# Patient Record
Sex: Female | Born: 1955 | Race: Black or African American | Hispanic: No | Marital: Single | Smoking: Current every day smoker
Health system: Southern US, Community
[De-identification: ages and names within clinical notes are randomized; demographics above are authoritative.]

## PROBLEM LIST (undated history)

## (undated) DIAGNOSIS — I509 Heart failure, unspecified: Secondary | ICD-10-CM

## (undated) DIAGNOSIS — I10 Essential (primary) hypertension: Secondary | ICD-10-CM

## (undated) DIAGNOSIS — Z5189 Encounter for other specified aftercare: Secondary | ICD-10-CM

## (undated) DIAGNOSIS — I639 Cerebral infarction, unspecified: Secondary | ICD-10-CM

## (undated) HISTORY — PX: ABDOMINAL SURGERY: SHX537

---

## 1999-05-16 ENCOUNTER — Emergency Department (HOSPITAL_COMMUNITY): Admission: EM | Admit: 1999-05-16 | Discharge: 1999-05-16 | Payer: Self-pay | Admitting: Emergency Medicine

## 2000-12-17 ENCOUNTER — Inpatient Hospital Stay (HOSPITAL_COMMUNITY): Admission: EM | Admit: 2000-12-17 | Discharge: 2000-12-31 | Payer: Self-pay | Admitting: Emergency Medicine

## 2000-12-17 ENCOUNTER — Encounter: Payer: Self-pay | Admitting: Emergency Medicine

## 2000-12-17 ENCOUNTER — Encounter: Payer: Self-pay | Admitting: Anesthesiology

## 2000-12-19 ENCOUNTER — Encounter: Payer: Self-pay | Admitting: Endocrinology

## 2000-12-24 ENCOUNTER — Encounter: Payer: Self-pay | Admitting: Pulmonary Disease

## 2000-12-26 ENCOUNTER — Encounter: Payer: Self-pay | Admitting: Pulmonary Disease

## 2000-12-27 ENCOUNTER — Encounter: Payer: Self-pay | Admitting: Critical Care Medicine

## 2000-12-28 ENCOUNTER — Encounter: Payer: Self-pay | Admitting: Pulmonary Disease

## 2000-12-29 ENCOUNTER — Encounter: Payer: Self-pay | Admitting: Pulmonary Disease

## 2001-01-21 ENCOUNTER — Inpatient Hospital Stay (HOSPITAL_COMMUNITY): Admission: EM | Admit: 2001-01-21 | Discharge: 2001-01-28 | Payer: Self-pay | Admitting: Emergency Medicine

## 2001-01-21 ENCOUNTER — Encounter: Payer: Self-pay | Admitting: Emergency Medicine

## 2001-01-22 ENCOUNTER — Encounter: Payer: Self-pay | Admitting: Internal Medicine

## 2001-01-27 ENCOUNTER — Encounter: Payer: Self-pay | Admitting: Pulmonary Disease

## 2002-04-21 ENCOUNTER — Emergency Department (HOSPITAL_COMMUNITY): Admission: EM | Admit: 2002-04-21 | Discharge: 2002-04-21 | Payer: Self-pay | Admitting: Emergency Medicine

## 2002-11-21 ENCOUNTER — Encounter: Payer: Self-pay | Admitting: Emergency Medicine

## 2002-11-21 ENCOUNTER — Emergency Department (HOSPITAL_COMMUNITY): Admission: EM | Admit: 2002-11-21 | Discharge: 2002-11-21 | Payer: Self-pay | Admitting: Emergency Medicine

## 2005-01-17 ENCOUNTER — Ambulatory Visit: Payer: Self-pay | Admitting: Internal Medicine

## 2005-01-17 ENCOUNTER — Inpatient Hospital Stay (HOSPITAL_COMMUNITY): Admission: EM | Admit: 2005-01-17 | Discharge: 2005-01-20 | Payer: Self-pay | Admitting: Emergency Medicine

## 2005-04-28 ENCOUNTER — Emergency Department (HOSPITAL_COMMUNITY): Admission: EM | Admit: 2005-04-28 | Discharge: 2005-04-28 | Payer: Self-pay | Admitting: Emergency Medicine

## 2005-05-02 ENCOUNTER — Ambulatory Visit: Payer: Self-pay | Admitting: *Deleted

## 2005-05-02 ENCOUNTER — Ambulatory Visit: Payer: Self-pay | Admitting: Family Medicine

## 2005-09-03 ENCOUNTER — Inpatient Hospital Stay (HOSPITAL_COMMUNITY): Admission: EM | Admit: 2005-09-03 | Discharge: 2005-09-10 | Payer: Self-pay | Admitting: Emergency Medicine

## 2006-09-24 ENCOUNTER — Ambulatory Visit: Payer: Self-pay | Admitting: Pulmonary Disease

## 2006-09-24 ENCOUNTER — Inpatient Hospital Stay (HOSPITAL_COMMUNITY): Admission: EM | Admit: 2006-09-24 | Discharge: 2006-10-08 | Payer: Self-pay | Admitting: Emergency Medicine

## 2006-09-25 ENCOUNTER — Encounter: Payer: Self-pay | Admitting: Vascular Surgery

## 2006-09-29 ENCOUNTER — Encounter: Payer: Self-pay | Admitting: Vascular Surgery

## 2006-10-21 ENCOUNTER — Ambulatory Visit (HOSPITAL_COMMUNITY): Admission: RE | Admit: 2006-10-21 | Discharge: 2006-10-21 | Payer: Self-pay | Admitting: Surgery

## 2006-10-21 ENCOUNTER — Encounter: Payer: Self-pay | Admitting: Vascular Surgery

## 2006-11-25 ENCOUNTER — Ambulatory Visit: Payer: Self-pay | Admitting: Family Medicine

## 2007-03-04 ENCOUNTER — Emergency Department (HOSPITAL_COMMUNITY): Admission: EM | Admit: 2007-03-04 | Discharge: 2007-03-04 | Payer: Self-pay | Admitting: Emergency Medicine

## 2007-03-07 ENCOUNTER — Emergency Department (HOSPITAL_COMMUNITY): Admission: EM | Admit: 2007-03-07 | Discharge: 2007-03-07 | Payer: Self-pay | Admitting: Emergency Medicine

## 2008-09-01 ENCOUNTER — Emergency Department (HOSPITAL_COMMUNITY): Admission: EM | Admit: 2008-09-01 | Discharge: 2008-09-01 | Payer: Self-pay | Admitting: Emergency Medicine

## 2008-09-02 ENCOUNTER — Inpatient Hospital Stay (HOSPITAL_COMMUNITY): Admission: EM | Admit: 2008-09-02 | Discharge: 2008-09-05 | Payer: Self-pay | Admitting: Emergency Medicine

## 2008-09-07 ENCOUNTER — Inpatient Hospital Stay (HOSPITAL_COMMUNITY): Admission: EM | Admit: 2008-09-07 | Discharge: 2008-09-19 | Payer: Self-pay | Admitting: Emergency Medicine

## 2009-07-12 ENCOUNTER — Encounter: Admission: RE | Admit: 2009-07-12 | Discharge: 2009-07-12 | Payer: Self-pay | Admitting: Interventional Radiology

## 2010-11-10 ENCOUNTER — Emergency Department (HOSPITAL_COMMUNITY)
Admission: EM | Admit: 2010-11-10 | Discharge: 2010-11-10 | Payer: Self-pay | Source: Home / Self Care | Admitting: Family Medicine

## 2010-11-23 ENCOUNTER — Emergency Department (HOSPITAL_COMMUNITY)
Admission: EM | Admit: 2010-11-23 | Discharge: 2010-11-23 | Payer: Self-pay | Source: Home / Self Care | Admitting: Family Medicine

## 2010-11-27 LAB — WET PREP, GENITAL
Trich, Wet Prep: NONE SEEN
Yeast Wet Prep HPF POC: NONE SEEN

## 2010-11-29 LAB — HERPES SIMPLEX VIRUS CULTURE: Culture: NOT DETECTED

## 2011-01-22 LAB — GC/CHLAMYDIA PROBE AMP, GENITAL
Chlamydia, DNA Probe: NEGATIVE
GC Probe Amp, Genital: NEGATIVE

## 2011-03-27 NOTE — Discharge Summary (Signed)
[name], CARRE NO.:  [id number]   MEDICAL RECORD NO.:  [id number]          PATIENT TYPE:  INP   LOCATION:  1422                         FACILITY:  [facility]   PHYSICIAN:  Altha Harm, MDDATE OF BIRTH:  Oct 01, 1956   DATE OF ADMISSION:  09/07/2008  DATE OF DISCHARGE:                               DISCHARGE SUMMARY   FINAL DISCHARGE DIAGNOSES:  1. Gastric ulcer with uncontrolled gastrointestinal bleeding during      endoscopy.  2. Status post exploratory laparotomy for oversew of ulcer.  3. Acute blood loss anemia.  4. Gastrointestinal bleed.  5. Urinary tract infection, treated.  6. Hypertension.  7. Orthostatic hypotension.  8. Hypokalemia.  9. History of hyperlipidemia.  10.History of schizophrenia.  11.History of pulmonary embolus in the past.  12.Bipolar disorder.  13.History of peptic ulcer disease.   DISCHARGE MEDICATIONS:  To be dictated at time of discharge.   CONSULTANTS:  1. Dr. Jeani Hawking, gastroenterology.  2. Dr. Donell Beers, surgery.   PROCEDURES:  1. Status post exploratory laparotomy, lysis of adhesions, gastrostomy      reversal of bleeding Dieulafoy lesion.  2. EGD.   DIAGNOSTIC STUDIES:  1. Portable chest x-ray on admission which shows worsening pneumonia      both lungs, stable cardiomegaly.  2. Portable chest x-ray follow up.  Progressive bibasilar atelectasis,      consolidation and probable effusions.  3. KUB of the abdomen, one-view done on September 13, 2008, which      showed a nonspecific nonobstructive bowel gas pattern.  No evidence      of pneumoperitoneum.  Relatively unchanged bilateral pleural      effusions.   CODE STATUS:  Full code.   ALLERGIES:  NO KNOWN DRUG ALLERGIES.   PRIMARY CARE PHYSICIAN:  Dr. Ronne Binning.   HOSPITAL COURSE:  1. GI bleed.  The patient was admitted with coffee-ground emesis.      Gastroenterology was consulted and the patient was taken to      endoscopy.  During endoscopy, the  patient had uncontrolled bleeding      and had to be taken emergently to surgery for exploratory      laparotomy and oversew of the Dieulafoy lesion.  The patient      received several units of blood for repletion of blood volume.  The      patient has since been stable from a hematologic standpoint and      from a hemodynamic standpoint.  2. Hypertension.  The patient has longstanding hypertension and      typically takes Diovan, Coreg and clonidine at home.  The patient      has been n.p.o. and has been unable to take these medications by      mouth.  She has substituted antihypertensives, including clonidine      patch and labetalol.  The patient has had some orthostatic      hypotension, thus the labetalol will be decreased to 10 mg instead      of 20 mg.  The patient does have a history of CHF; however, we have  no documented left ventricular function for her.  A 2-D echo is      being ordered for this patient.  3. Urinary any tract infection.  The patient had pyuria on admission.      A urinalysis was done; however, no urine culture was sent off.  The      patient was treated empirically and her antibiotics have been      discontinued.  4. Hypokalemia.  The patient has had profound hypokalemia despite      repletion of her electrolytes.  The patient is receiving magnesium      sulfate this evening as the magnesium level is being checked and      they will continue the patient on her potassium.  The timing of the      patient's p.o. intake will be left to the discretion of the      surgeons.  When the patient is back to taking her p.o., her home      medications should be resumed.      Altha Harm, MD  Electronically Signed     MAM/MEDQ  D:  09/14/2008  T:  09/14/2008  Job:  [id number]

## 2011-03-27 NOTE — Op Note (Signed)
NAMENERA, HAWORTH NO.:  [id number]   MEDICAL RECORD NO.:  [id number]          PATIENT TYPE:  INP   LOCATION:  1233                         FACILITY:  [facility]   PHYSICIAN:  Almond Lint, MD       DATE OF BIRTH:  [birth year]   DATE OF PROCEDURE:  09/08/2008  DATE OF DISCHARGE:                               OPERATIVE REPORT   PRIMARY DIAGNOSIS:  Bleeding Dieulafoy's lesion.   POSTOPERATIVE DIAGNOSIS:  Bleeding Dieulafoy's lesion.   PROCEDURE PERFORMED:  Exploratory laparotomy, lysis of adhesions,  gastrotomy, oversew of bleeding Dieulafoy's lesion.   SURGEON:  Almond Lint, M.D.   ASSISTANT:  Ollen Gross. Vernell Morgans, M.D.   ANESTHESIA:  General.   FINDINGS:  Multiple ropey vessels on the outside of the stomach, one  large one was deemed to be the culprit with the stomach opened, and this  was over-sewn.  There was no bleeding intraluminally at the end of the  case.  There were other multiple extremely large vessels seen in the  omentum.   ESTIMATED BLOOD LOSS:  Total of at least 1600 cc of blood loss that was  ongoing throughout the case.  There was at least 500 of clot in the  stomach and 1100 in the NG tube that makes up that amount.   COMPLICATIONS:  None known.   INDICATIONS:  Ms. Hargrove is a 55 year old female with a past history of  Dieulafoy's lesions and ulcers who two years ago underwent exploratory  laparotomy, gastrotomy, and over-sew of both a gastric perforation and a  Dieulafoy's lesion.  She presented last week with GI bleed, stabilized,  and went home and returned again this week with pneumonia and a GI  bleed.  I was consulted STAT to go to endoscopy where they saw a  Dieulafoy's lesion, and it started to ooze during the scope.  After one  clip was placed, it immediately broke loose and started hemorrhaging  dramatically.  Several other clips were placed, but it continued to pump  faster than they could suction it out.  Three injections of  epinephrine  were unsuccessful in stopping the bleeding.  The decision was made to  take her to the operating room emergently for over-sew of this vessel.   PROCEDURE:  Ms. Fuhrer was identified in the holding area, and emergency  consent was obtained.  The patient was still sedated from endoscopy but  was able to answer yes/no questions, and she was informed of what was  happening and gave her assent.  Also, her fiance was notified and gave  his assent as well.  She was taken to the operating room, where she was  placed supine on the operating room table, and general anesthesia was  induced.  A Foley catheter was placed.  An arterial line was placed, and  her abdomen was prepped and draped in a sterile fashion.  An upper  midline incision was made to just above the umbilicus.  This is through  her prior area of scar.  Bovie electrocautery was used to divide the  subcutaneous tissues.  Once the fascia was identified, Kocher clamps  were used to elevate the fascia and enter the abdomen sharply.  The  upper portion of the incision had liver adherent to it, and this was  dissected off with the Bovie.  The incision was carried inferiorly,  taking care not to injure the underlying small bowel loops.  The  incision had to be carried out to below the umbilicus because of the  restrictiveness of the prior scar in the abdominal wall.  Again, care  was taken not to injure any underlying structures, and the adhesions  were lysed carefully underneath the abdominal incision with Metzenbaum  and Mayo scissors.  There were no enterotomies made.   The stomach was dissected off of the liver, and a Bookwalter retractor  was used to facilitate visualization.  Once the liver and the stomach  adhesions were divided, a Sweetheart retractor was placed under the  liver to facilitate visualization.  The stomach was extremely distended,  presumably with clots.  The anterior portion of the stomach was  completely  visualized, and the short gastrics were not taken down.  The  anterior stomach close to the gastric curve was identified, and two stay  sutures were placed laterally and medially.  A gastrotomy was made using  the Bovie electrocautery.  This was opened about 4 cm, and the clot was  expressed from the stomach.  The gastrotomy was carried out superiorly  on the stomach with the Bovie, and the site of the clips was seen.  The  area was irrigated, and the bleeding vessel was identified.  This was  clamped with a DeBakey forceps and then an Allis was placed on the  outside across the large vessel that was visible on the external stomach  wall on this location.  This was over-sewn with three 2-0 Vicryl sutures  on a CT-1 needle.  There was an additional vessel that appeared to be  bleeding traversing to the area of bleeding from a different direction,  and this was also over-sewn.  The internal stomach was irrigated on the  area of the Dieulafoy's lesion.  There was no ulcer seen and no mass  palpated.  The stomach was palpated on the greater and lesser curves, in  addition to the pylorus.  There were no masses found.  The stomach was  again irrigated, and no bleeding was seen.  The stomach was closed in  two layers with the first layer being a 2-0 PDS in a Connell fashion.  This was over-sewn with 3-0 Vicryl Lembert sutures.  The abdomen was  then irrigated, and the adhesions were then wiped underneath the  inferior portion of the wound in order to facilitate closure.  The  fascia was then closed using two 0 looped PDS sutures in a running  fashion.  The skin was closed using skin staples.  The wound was then  cleaned, dried, and dressed with an island dressing.  The patient was  awakened from anesthesia and taken to the PACU in stable condition.      Almond Lint, MD  Electronically Signed     FB/MEDQ  D:  09/08/2008  T:  09/08/2008  Job:  [id number]

## 2011-03-27 NOTE — Discharge Summary (Signed)
[name], BROWNLEY NO.:  [id number]   MEDICAL RECORD NO.:  [id number]          PATIENT TYPE:  INP   LOCATION:  1528                         FACILITY:  [facility]   PHYSICIAN:  Hillery Aldo, M.D.   DATE OF BIRTH:  [birth year]   DATE OF ADMISSION:  09/02/2008  DATE OF DISCHARGE:  09/05/2008                               DISCHARGE SUMMARY   PRIMARY CARE PHYSICIAN:  Lorelle Formosa, M.D.   DISCHARGE DIAGNOSES:  1. Bilateral pneumonia.  2. Upper GI (gastrointestinal) bleed.  3. Duodenitis.  4. Hypertension.  5. Hypokalemia.  6. History of cardiomyopathy and congestive heart failure.  7. Hyperlipidemia.  8. Polysubstance abuse, ongoing.  9. Nausea and vomiting.  10.Normocytic anemia.   DISCHARGE MEDICATIONS:  1. Diovan 160 mg daily.  2. Clonidine 0.2 mg b.i.d.  3. Lyrica 75 mg b.i.d.  4. Coreg 3.125 mg b.i.d.  5. Lipitor 20 mg daily.  6. Remeron 15 mg q.h.s.  7. Risperdal 1 mg q.h.s.  8. Zoloft 100 mg daily.  9. ProAir 2 puffs every 4 hours as needed.  10.Combivent 2 puffs every 6 hours as needed.  11.Ceftin 500 mg q.12 h x10 days.  12.Tussionex 1 teaspoon q.12 h p.r.n. cough.  13.Mucinex 600 mg q.12 h p.r.n.  14.Protonix 40 mg daily.   CONSULTATIONS:  Dr. Elnoria Howard of gastroenterology.   BRIEF ADMISSION HPI:  The patient is a 55 year old female who presented  to the hospital with chief complaint of upper respiratory type symptoms  in the setting of nausea and vomiting up coffee-ground emesis.  She had  recently been diagnosed with an upper respiratory infection and put on  doxycycline when she developed the nausea and vomiting.  She was  admitted for further evaluation and workup.  For the full details,  please see my dictated report.   PROCEDURES AND DIAGNOSTIC STUDIES:  1. Chest x-ray on September 01, 2008, showed cardiomegaly.  Patchy      pulmonary infiltrates bilaterally consistent with pneumonia.  2. Acute abdominal series on September 02, 2008,  showed consistent      stable pulmonary infiltrates.  Negative abdomen.  3. Upper endoscopy done on September 03, 2008, showed evidence of mild      erythema at the distal esophagus but no clear Mallory Weiss tear.      In the gastric lumen there was evidence of a prior gastrostomy site      from surgery and a prior bleeding site in cardia but no evidence of      any active or old bleeding.  A very mild duodenal bulb duodenitis      was found.  Recommendations were to avoid all nonsteroidal anti-      inflammatory medications.   DISCHARGE LABORATORY VALUES:  Sodium was 144, potassium 5.2, chloride  111, bicarb 30, BUN 5, creatinine 0.8, glucose 118.  White blood cell  count was 4.6, hemoglobin 9.1, hematocrit 28.7, platelets 178.   HOSPITAL COURSE:  1. Bilateral pneumonia:  The patient was empirically put on Rocephin      and azithromycin.  Strep pneumonia antigen studies  were      subsequently positive and the azithromycin was discontinued.  She      has completed 3 days of therapy with IV Rocephin and we will      transition her to p.o. Ceftin for an additional 10 days of      treatment.  She is afebrile with no leukocytosis.  2. Upper GI bleed:  This was transient and likely related to either a      recurrent Mallory Weiss tear or a transient gastric bleed.  She did      undergo upper endoscopy with findings as noted above.  Her      hemoglobin and hematocrit have remained stable throughout her      hospital stay.  3. Hypertension:  The patient's blood pressure is well-controlled on      her home regimen.  4. Hypokalemia:  The patient was appropriately repleted.  5. History of cardiomyopathy and congestive heart failure:  The      patient has remained well-compensated throughout her hospital stay.  6. Hyperlipidemia:  The patient was maintained on statin therapy.  7. Polysubstance abuse:  The patient was counseled regarding the      importance of cessation.  She will be referred to  ADS at discharge.  8. Nausea and vomiting:  May have been a reaction to the azithromycin.      Resolved when the azithromycin was discontinued.  9. Normocytic anemia:  Likely acute blood loss was contributory.  Her      hemoglobin and hematocrit have remained stable since discharge.   DISPOSITION:  The patient is encouraged to follow up with Dr. Ronne Binning.  She was given prescriptions for all of her usual routine medicines and  instructed to return to the hospital if she should develop worsening  symptoms or recurrent fever.   Time spent coordinating care and discharge planning equaled 35 minutes.      Hillery Aldo, M.D.  Electronically Signed     CR/MEDQ  D:  09/05/2008  T:  09/05/2008  Job:  [id number]   cc:   Lorelle Formosa, M.D.  Fax: [phone]

## 2011-03-27 NOTE — H&P (Signed)
NAMEROSELIA, SNIPE NO.:  [id number]   MEDICAL RECORD NO.:  [id number]          PATIENT TYPE:  EMS   LOCATION:  ED                           [facility]ACILITY[facility]  [facility]   PHYSICIAN:  Sabino Donovan, MD        DATE OF BIRTH:  1956-10-13   DATE OF ADMISSION:  09/07/2008  DATE OF DISCHARGE:                              HISTORY & PHYSICAL   CHIEF COMPLAINT:  Upper GI bleed.   HISTORY OF PRESENT ILLNESS:  The patient is a 55 year old African  American female with a history of hypertension and hyperlipidemia,  history of pulmonary embolus, history of schizophrenia, bipolar  disorder, CHF, history of GI bleed, peptic ulcer disease, status post  gastrostomy, history of duodenitis, who was recently discharged from  [facility] about 2 days ago for pneumonia and upper GI bleed.  At that time, endoscopy was done and she was noted to have mild  duodenitis.  Otherwise, no active ulcer was noted.  She reports that she  went home and was feeling better.  She reports taking all her medication  as prescribed.  Today, she felt nauseated and had 3-4 episodes of frank  hematemesis.  She reports that each time it was vomitus of about 32  ounces.  She denies any retching, reports some chills, but no fever, no  cough.  She denies any history of excessive NSAID use or denies any  history of alcohol use.  She denies any seizure symptoms and does report  some dizziness, but denies any falling down or palpitations.   PAST MEDICAL HISTORY:  1. Hypertension.  2. Hyperlipidemia.  3. History of pulmonary embolism.  4. History of schizophrenia.  5. Bipolar disorder.  6. Congestive heart failure.  7. History of GI bleed.  8. Peptic ulcer disease.  9. History of duodenitis.   PAST SURGICAL HISTORY:  Gastrostomy.   FAMILY HISTORY:  Mother died at age of 94 of pneumonia.  Father died at  age 1 with COPD and old age.  She has three siblings who are all  healthy.   SOCIAL HISTORY:  Smokes.   Used to drink occasional alcohol, but not any  longer.  No IV drug use.   DRUG ALLERGIES:  NO KNOWN DRUG ALLERGIES.   MEDICATIONS:  1. Diovan 150 once a day.  2. Clonidine 0.2 mg once a day.  3. Lyrica 150 mg once a day.  4. Coreg 12.5 once a day.  5. Lipitor 20 mg once a day.  6. Mirtazapine 50 mg p.o. nightly.  7. Stadol 1 mg p.o. once a day.  8. Tussionex p.r.n.  9. Doxycycline.  10.Protonix.  11.Cefuroxime.   REVIEW OF SYSTEMS:  Unremarkable.   PHYSICAL EXAMINATION:  VITAL SIGNS:  Temperature 97.8, pulse 68,  respiratory rate 18, blood pressure 75/47 on presentation.  At the time  I interviewed, it was 106/72.  O2 saturation 98%.  GENERAL:  No acute distress.  Reports feeling weak.  HEENT:  PERRLA.  EOMI.  NECK:  No lymphadenopathy or thyromegaly.  No JVD.  CHEST:  Clear to auscultation  bilaterally.  CARDIOVASCULAR:  Regular rate and rhythm.  No murmurs, rubs or gallops.  ABDOMEN:  Soft, nontender, nondistended.  Normoactive bowel sounds.  She  has a scar from previous gastrostomy.  EXTREMITIES:  No clubbing, cyanosis or edema.  Warm and well perfused.   LABORATORY DATA:  Sodium 140, potassium 4.4, BUN 13, creatinine 0.6,  white count 8.3, and 20.1, platelets 223, AST 16, ALT 9, total protein  12.4, albumin 2.3.  INR 1.2, PTT 10.1.   IMPRESSION:  A 55 year old [place] American female with multiple medical  problems admitted for upper gastrointestinal bleed.   1. Upper gastrointestinal bleed.  The patient has a prior history of      peptic ulcer disease.  Recent endoscopy showing duodenitis.  At the      time of her last discharge, her hematocrit was around 28.  This      presentation, hematocrit is 20.1 which is probably over estimating      her total body RBCs.  At this time, her vital signs are stable      after given 1 liter of normal saline in the emergency room.  Of      note, the patient has not been on Protonix drip in the emergency      room, as well as  continuous blood pressure monitoring and no      transfusion ordered .  I have instructed the emergency room      physician to order these to facilitate her care.  At this time,      will hold all hypertensive medication.  Dr. Welton Flakes from GI has been      consulted.  We will place an NG tube and lavage until the patient      is clear.  I will initiate the patient on a Protonix drip.  There      is no reason to suspect liver disease.  She had an endoscopy 2 days      ago, and it did not show any evidence of varices, will avoid NSAIDs      and alcohol use.  2. Hypertension.  Will hold blood pressure medications, give IV fluids      and transfuse.  3. History of schizophrenia.  Will hold medications now and reinitiate      as necessary.  4. Bipolar disorder.  Again, hold medications for now and reinitiate      as necessary.  5. Congestive heart failure, currently hypovolemic.  We will monitor      closely for evidence of volume overload.  At this time will hold      all the blood pressure medications, including aspirin.  6. Prophylaxis.  Sequential compression devices and Protonix drip.      Sabino Donovan, MD  Electronically Signed    MJ/MEDQ  D:  09/07/2008  T:  09/07/2008  Job:  [id number]

## 2011-03-27 NOTE — H&P (Signed)
[name], [name] NO.:  [id number]   MEDICAL RECORD NO.:  [id number]          PATIENT TYPE:  INP   LOCATION:  0104                         FACILITY:  [facility]   PHYSICIAN:  Hillery Aldo, M.D.   DATE OF BIRTH:  January 18, 1956   DATE OF ADMISSION:  09/02/2008  DATE OF DISCHARGE:                              HISTORY & PHYSICAL   PRIMARY CARE PHYSICIAN:  Dr. Ronne Binning.   CHIEF COMPLAINT:  Vomiting, coffee-ground emesis.   HISTORY OF PRESENT ILLNESS:  The patient is a 55 year old female with  past medical history of exsanguinating gastric hemorrhage in April of  2008 status post exploratory laparoscopy, gastrostomy and closure of  perforation who presents with a 24-hour history of nausea and vomiting.  The patient was seen in the emergency department on September 01, 2008 for  upper respiratory infection symptoms and cough and was diagnosed with  pneumonia and subsequently sent home on a course of doxycycline.  The  patient became sick on her stomach this morning and vomited three times,  all with coffee-ground appearance.  She has also been dizzy and  lightheaded.  Although she denied any recent nonsteroidal anti-  inflammatory medicine or aspirin use, she was noted to have an active  prescription for Naprosyn in her medication bag.  She apparently did not  know that this was a nonsteroidal anti-inflammatory medicine and should  be avoided.  She denies any diarrhea.  She does not have an appetite and  feels weak.  She denies any pharyngitis.  She has been short of breath  and complains of a cough productive of a milky-appearing sputum.  She  does report subjective fever and chills.  She also complains of a  pleuritic-type chest pain associated with cough and states my heart  feels funny.  The patient is being admitted for further evaluation,  stabilization and treatment.   PAST MEDICAL HISTORY:  1. Pneumococcal pneumonia with acute respiratory distress syndrome  complicated by prolonged ventilatory dependent respiratory failure      in March of 2002.  2. Bipolar disorder/schizophrenia.  3. History of cardiomyopathy and congestive heart failure, no      documented ejection fraction on chart.  4. History of hypertension.  5. Gastric ulcer with perforation, status post gastrostomy and closure      of perforation in April of 2008.  6. History of GI bleeding secondary to [employer] tear in November      2007.  7. Hyperlipidemia.  8. Remote cocaine abuse.  9. History of pulmonary embolism.  10.Restaurant restless leg syndrome.   FAMILY HISTORY:  The patient's mother died at 53 of pneumonia.  The  patient's father died at 61 from complications of COPD and old age.  She  has three siblings that are healthy.  There is hypertension and diabetes  in the extended family.   SOCIAL HISTORY:  The patient is single and currently lives with a  friend.  She has not smoked for the past 2 weeks but had a one-pack per  week habit prior to this.  She denies any alcohol or  drug use.  She is  currently disabled and not working.   ALLERGIES:  NONE.   MEDICATIONS:  1. Diovan 160 mg daily.  2. Clonidine 0.2 mg b.i.d.  3. Lyrica 75 mg b.i.d.  4. Coreg 3.125 mg b.i.d.  5. Lipitor 20 mg daily.  6. Remeron 15 mg nightly.  7. Risperdal 1 mg nightly.  8. Doxycycline 100 mg b.i.d. x7 days.  9. Zoloft 100 mg daily.  10.ProAir 2 puffs q.4 hours p.r.n.  11.Combivent 2 puffs q.6 hours p.r.n.  12.Naproxen 500 mg b.i.d.   REVIEW OF SYSTEMS:  A comprehensive 14-point review of systems as noted  in the elements of the HPI above.  Otherwise negative.   PHYSICAL EXAM:  Temperature 98.6, pulse 78, respirations 18, blood  pressure 131/69, O2 saturation 95% on room air.  GENERAL:  This is a thin female who appears mildly toxic and in distress  secondary to discomfort.  HEENT:  Normocephalic, atraumatic.  PERRL.  EOMI.  Oropharynx appears  clear.  NECK:  Supple, no  thyromegaly, no lymphadenopathy, no jugular venous  distention.  CHEST:  Decreased breath sounds but clear bilaterally.  HEART:  Regular rate, rhythm.  No murmurs, rubs, or gallops.  ABDOMEN:  Soft, nontender, nondistended with normoactive bowel sounds.  EXTREMITIES:  No clubbing, edema, or cyanosis.  SKIN:  Warm and dry.  No rashes.  NEUROLOGIC:  The patient is alert and oriented x3.  Cranial nerves II-  XII are grossly intact.  Nonfocal.   DATA REVIEW:  Acute abdominal series shows subtle pulmonary infiltrates,  but is otherwise negative in the abdomen.   LABORATORY DATA:  White blood cell count is 4.3, hemoglobin 10.9,  hematocrit 33, platelets 115.  Sodium is 142, potassium 3.9, chloride  105, bicarb 29, BUN 22, creatinine 0.69, glucose 94.  PT is 13.6, lipase  is 21.  Liver function studies show a total bilirubin of 1.3, alkaline  phosphatase 41, AST 44, ALT 11, total protein 6, albumin 3.1, calcium  8.2.   ASSESSMENT/PLAN:  1. Bilateral pneumonia:  Moved the patient to the Step-down Unit given      her history of ventilatory dependent respiratory failure in the      past.  Will check sputum cultures as well as urinary Legionella      antigen studies and Streptococcal pneumonia antigen studies.  Will      start the patient empirically on Rocephin and azithromycin to cover      usual community-acquired pathogens.  Will start her on Mucinex and      nebulized bronchodilator therapy as well.  We will monitor her      closely for signs of respiratory compromise.  2. Upper gastrointestinal bleed:  The patient has had documented heme-      positive stool as checked by the emergency department physician and      has an elevated BUN suggestive of an upper source.  At this time, I      have spoken with Dr. Elnoria Howard, who plans to do an upper endoscopy on      the patient in the morning.  The patient will be kept NPO after      midnight and placed on intravenous proton pump inhibitor  therapy      q.12 hour.  We will type and cross her for 4 units of packed red      blood cells, and monitor her closely for a precipitous drop in her  hemoglobin.  Will transfuse her if needed.  3. Hypertension:  Will continue the patient's antihypertensives if her      blood pressure meets appropriate parameters.  4. History of cardiomyopathy/congestive heart failure:  Monitor the      patient for signs of volume overload.  5. Hyperlipidemia:  Continue the patient's statin.  6. History of polysubstance abuse:  Will check a urine drug screen.  7. Prophylaxis:  The patient will be put on PAS hoses for deep venous      thrombosis prophylaxis.  She will be put on proton pump inhibitor      therapy as well for gastrointestinal stress ulcer prophylaxis.      Hillery Aldo, M.D.  Electronically Signed     CR/MEDQ  D:  09/02/2008  T:  09/02/2008  Job:  [id number]   cc:   Dr. Ronne Binning

## 2011-03-27 NOTE — Consult Note (Signed)
NAMEKLAIRA, PESCI NO.:  [id number]   MEDICAL RECORD NO.:  [id number]          PATIENT TYPE:  INP   LOCATION:  1233                         FACILITY:  [facility]   PHYSICIAN:  Almond Lint, MD       DATE OF BIRTH:  25-Jun-1956   DATE OF CONSULTATION:  09/08/2008  DATE OF DISCHARGE:                                 CONSULTATION   GENERAL SURGICAL CONSULTATION   CHIEF COMPLAINT:  Bleeding Dieulafoy lesion.   HISTORY OF PRESENT ILLNESS:  Ms. Guthridge is a 55 year old female with  significant past medical history of GI bleeds, who was in the endoscopy  suite undergoing repeat EGD for a significant GI bleed when a Dieulafoy  lesion was clipped and started hemorrhaging significantly.  It was  hemorrhaging faster than the ability to suction the blood out of the  stomach.  It did not respond to epinephrine injection either.  She had  an  NG tube placed with bright red blood coming out of it.  This is her  second admission in the last week for a GI bleed.  Last week the EGD  showed a mild duodenitis but no other lesions, no ulcers.  Today, she  continued not to have any ulcers seen but did have this Dieulafoy lesion  seen.  She was admitted yesterday with hematemesis, chills, no fever, no  cough.  To the primary team she denied excessive NSAID use or alcohol  use.   PAST MEDICAL HISTORY:  Significant for:  1. Hypertension.  2. Hyperlipidemia.  3. Pulmonary embolism.  4. Schizophrenia.  5. Bipolar disorder.  6. CHF.  7. GI bleed.  8. Peptic ulcer disease.  9. Duodenitis.   PAST SURGICAL HISTORY:  In November of 2007 she underwent an exploratory  laparotomy, gastrostomy and over-sew of a similar Dieulafoy lesion.  This was done by Dr. Wenda Low.   FAMILY HISTORY:  Mother died at age 23 of pneumonia.  Father died at 104  of COPD.  She has three healthy siblings.   SOCIAL HISTORY:  Smokes.  Former alcohol use.  No IV drug use.   DRUG ALLERGIES:  None known.   HOME  MEDICATIONS:  1. Diovan 150 once a day.  2. Clonidine 0.2 mg once a day.  3. Lyrica 150 mg once a day.  4. Coreg 12.5 mg once a day.  5. Lipitor 20 mg once a day.  6. Mirtazapine 50 mg once a day.  7. Stadol 1 mg once a day.  8. Tussionex p.r.n.  9. Doxycycline.  10.Protonix.  11.Cefuroxime.   REVIEW OF SYSTEMS:  Is unable to be obtained secondary to her sedation.   PREOPERATIVE MEDICATIONS:  1. Guaifenesin.  2. Senna.  3. Protonix drip.  4. Moxifloxacin.  5. Phenergan.  6. Zofran.  7. Ambien.  8. Albuterol.  9. Atrovent.  10.Tylenol.  11.Oxycodone.  12.Colace.   PHYSICAL EXAMINATION:  VITAL SIGNS:  In the endoscopy suite, the  saturation 99 on nasal cannula oxygen, heart rate 115, blood pressure  150/90 after epinephrine injection.  GENERAL:  She is sedated and  endoscopy was in progress.  ABDOMEN:  Has a midline scar but is soft.  HEART:  Regular.  LUNGS:  Clear except at the bases.  EXTREMITIES:  Warm, well perfused.  HEENT:  Sclerae anicteric.   LABORATORY DATA:  Labs are pending although hematocrit yesterday  afternoon was 20.   ASSESSMENT AND PLAN:  Ms. Kerce is a 55 year old female with a massive  gastric hemorrhage from a Dieulafoy lesion.  I will take her to the  operating room for emergency exploratory laparotomy and over-sew of  bleeding vessel.  I will continue her on the intravenous Protonix.  She  will be on strict NPO.  This was discussed with the fiance'.      Almond Lint, MD  Electronically Signed     FB/MEDQ  D:  09/08/2008  T:  09/08/2008  Job:  [id number]

## 2011-03-27 NOTE — Consult Note (Signed)
NAMEJAHNESSA, [name] NO.:  [id number]   MEDICAL RECORD NO.:  [id number]          PATIENT TYPE:  INP   LOCATION:  1238                         FACILITY:  [facility]   PHYSICIAN:  Jordan Hawks. Elnoria Howard, MD    DATE OF BIRTH:  04-Sep-1956   DATE OF CONSULTATION:  09/03/2008  DATE OF DISCHARGE:                                 CONSULTATION   GASTROENTEROLOGY CONSULTATION   REASON FOR CONSULTATION:  Coffee ground emesis.   REFERRING PHYSICIAN:  [facility].   PRIMARY CARE PHYSICIAN:  This is an unassigned patient.   HISTORY OF PRESENT ILLNESS:  This is a 55 year old female with a past  medical history of an exsanguinating arterial gastric bleed in November  of 2007, status post surgical correction and small perforation in the  proximal small bowel, bipolar disease, schizophrenia, history of  cardiomyopathy and congestive heart failure, hypertension, history of  Mallory-Weiss tear, hyperlipidemia, history of PE, restless leg syndrome  who was admitted to the hospital with complaints of coffee ground  emesis.  The patient was initially evaluated in the emergency room for  complaints of a cough.  At that time she was diagnosed with pneumonia  and subsequently sent home.  Unfortunately, she continued to cough and  she had one episode of coffee ground emesis, although she says it was a  bit brighter in color than coffee grounds.  Because of the vomiting, the  patient subsequently presented to the emergency room for further  evaluation and treatment.  The patient denies any NSAID use for the past  week, although she was previously using Naprosyn for some minor aches  and pains. No complaints of any hematochezia but she does report having  melena.  Prior to this time, she was in her usual state of health and no  significant complaints at that time.   PAST MEDICAL AND SURGICAL HISTORY:  As stated above.   FAMILY HISTORY:  Noncontributory.   SOCIAL HISTORY:  Is  significant for tobacco.  No alcohol or current  illicit drug use.   ALLERGIES:  No known drug allergies.   MEDICATIONS:  1. Albuterol 2.5 mg inhaled q.6h.  2. Azithromycin 500 mg IV daily.  3. Carvedilol 3.125 mg p.o. b.i.d.  4. Ceftriaxone 1 gram IV daily.  5. Clonidine 0.2 mg p.o. b.i.d.  6. Mucinex 600 mg p.o. b.i.d.  7. Atrovent 0.5 mg inhaler q.6h.  8. Remeron 50 mg p.o. nightly.  9. Benicar is 20 mg p.o. daily.  10.Protonix 40 mg IV q.12h.  11.Lyrica 75 mg p.o. b.i.d.  12.Risperdal 1 mg p.o. nightly.  13.Zoloft 100 mg p.o. daily.  14.Zocor 40 mg  p.o. nightly.  15.Zofran 4 mg intravenously q.6h.  16.Phenergan 12.5 mg intravenously q.4h.  17.Guaifenesin 5 mL p.o. q.4h. p.r.n.   REVIEW OF SYSTEMS:  As stated above in history of present illness,  otherwise negative.   PHYSICAL EXAMINATION:  VITAL SIGNS:  Heart rate 68, respirations 16.  GENERAL:  The patient is in no acute distress, alert and oriented.  HEENT:  Normocephalic, atraumatic.  Extraocular movements intact.  NECK:  Supple.  No lymphadenopathy.  LUNGS:  Clear to auscultation bilaterally.  CARDIOVASCULAR:  Regular rate and rhythm.  ABDOMEN:  Is flat, soft, nontender, nondistended.  EXTREMITIES:  No cyanosis, clubbing or edema.   LABORATORY DATA:  White blood cell count is 3.1, hemoglobin 9.5, MCV  82.3, platelet count 108,000.  Sodium 142, potassium 3.0, chloride 110,  CO2 29, glucose 53, BUN 10, creatinine 0.53, AST 44, ALT 11, albumin is  3.1.   IMPRESSION:  1. Coffee ground emesis.  2. History of massive gastrointestinal bleed.   DISCUSSION:  The patient is hemodynamically stable.  She has had Mallory-  Weiss tears in the past and it could be that the coughing has resulted  in the vomiting which then caused a tear.  Her hemoglobin is mildly  decreased but overall she is hemodynamically stable while in the  stepdown unit.   PLAN:  For EGD and further recommendations pending the findings.       Jordan Hawks Elnoria Howard, MD  Electronically Signed     PDH/MEDQ  D:  09/03/2008  T:  09/03/2008  Job:  [id number]

## 2011-03-27 NOTE — Discharge Summary (Signed)
[name], METSKER NO.:  [id number]   MEDICAL RECORD NO.:  [id number]          PATIENT TYPE:  INP   LOCATION:  1422                         [facility]ACILITY:  [facility]   PHYSICIAN:  Theodosia Paling, MD    DATE OF BIRTH:  1955/12/08   DATE OF ADMISSION:  09/07/2008  DATE OF DISCHARGE:  09/18/2008                               DISCHARGE SUMMARY   Please refer to the primary care physician, Dr. Ronne Binning.  Please refer  to the discharge summary dictated by Dr. Marthann Schiller on September 07, 2008.  Since then, no further procedure or imaging have transpired.   DISCHARGE DIAGNOSES:  1. Gastric ulcer with uncontrolled gastrointestinal bleed during      endoscopy.  2. Status post exploratory laparotomy for bleeding peptic ulcer      disease.  3. Acute blood loss anemia.  4. Gastrointestinal bleed.  5. Urinary tract infection.  6. Hypertension.  7. Orthostatic hypertension.  8. Hyperkalemia.  9. Hyperlipidemia.  10.Schizophrenia.  11.History of pulmonary embolism in the past.  12.Bipolar disorder.  13.History of peptic ulcer.   DISCHARGE MEDICATIONS:  1. Clonidine 0.4 mg patch every week.  2. Lyrica 150 mg p.o. daily.  3. Coreg 12.5 mg p.o. daily.  4. Lipitor 20 mg p.o. daily.  5. Mirtazapine 50 mg p.o. q.h.s.  6. Risperdal 1 mg p.o. q.h.s.  7. Norvasc 10 mg p.o. daily.  8. Protonix 40 mg p.o. q.12 h.  9. MS Contin 50 mg p.o. q.12 h.  10.Morphine IR 15 mg p.o. q.4 h. p.r.n.  11.Phenergan 12.5 mg p.o. q.6 h. p.r.n.  12.Ensure chocolate 1 can p.o. daily.   FOLLOW UP:  1. The patient will follow up with Dr. Ronne Binning in 1 week's time for      evaluation and management of her abdominal pain further.  2. The patient will follow up with Dr. Donell Beers in the office in 1      week's time as well.   Total time spent in discharge of this patient 45 minutes.   DISCHARGE INSTRUCTIONS:  The patient will obtain home health care as  recommended by the physical therapist and  home health as arranged by  case manager.      Theodosia Paling, MD  Electronically Signed     NP/MEDQ  D:  09/18/2008  T:  09/18/2008  Job:  [id number]   cc:   Ronne Binning, MD   Jordan Hawks. Elnoria Howard, MD  Fax: [phone]   Almond Lint, MD  [address]  [place] [address]

## 2011-03-27 NOTE — Consult Note (Signed)
NAMEWINDI, [name] NO.:  [id number]   MEDICAL RECORD NO.:  [id number]          PATIENT TYPE:  INP   LOCATION:  1233                         [facility]ACILITY:  [facility]   PHYSICIAN:  Jordan Hawks. Elnoria Howard, MD    DATE OF BIRTH:  1956-09-08   DATE OF CONSULTATION:  09/08/2008  DATE OF DISCHARGE:                                 CONSULTATION   REFERRING PHYSICIAN:  [facility].  This is an unassigned  patient.   HISTORY OF PRESENT ILLNESS:  This is a 55 year old female with a past  medical history of bipolar disease, schizophrenia, history of pulmonary  embolism, hyperlipidemia, hypertension, congestive heart failure and  peptic ulcer disease, duodenitis and recent history of GI bleed  secondary to a Dieulafoy lesion status post oversew by Dr. Daphine Deutscher in the  summer of  2009 who is admitted to the hospital with hematemesis.  The  patient was recently evaluated by Dr. Elnoria Howard last week for complaints of  hematemesis.  At that time the endoscopy was performed and there was no  evidence of any blood during that examination.  However, the prior  treatment site in the cardia was identified as well as the prior  gastrostomy site.  No clear evidence of bleeding, although there was a  mild duodenitis and subsequently the patient was discharged home.  The  patient then represented on September 07, 2008 with complaints of  hematemesis and she brought in a cup with fresh blood.  At that point  she was noted to be hypotensive in the 70s range and her hemoglobin had  dropped down to 6.6.  The patient did not have any further nausea or  vomiting afterwards and subsequently remained stable with status post 4  units of blood transfusions.  Because of the recurrent hematemesis, GI  was requested for repeat endoscopy to identify the source of bleeding.   PAST MEDICAL AND SURGICAL HISTORY:  Is as stated above.   FAMILY HISTORY:  Noncontributory.   SOCIAL HISTORY:  Is positive for tobacco,  negative for illicit drug use.  Occasional alcohol.   ALLERGIES:  No known drug allergies.   MEDICATIONS:  1. Apresoline 10 mg IV every 4 hours p.r.n.  2. Avelox 400 mg IV daily.  3. Dilaudid 7.5 mg every 4 hours PCA.  4. Lopressor 5 mg IV every 4 hours.  5. Protonix 80 mg infusion.  6. Atrovent 0.5 mg inhaler every 2 hours p.r.n.  7. Compazine 5-10 mg IV every 6 hours p.r.n.  8. Dulcolax 10 mg PR p.r.n.  9. Phenergan 12.5 mg IV every 6 hours p.r.n.  10.Zofran 4 mg IV every 6 hours p.r.n.   PHYSICAL EXAMINATION:  VITAL SIGNS:  Blood pressure is in the 120s over  80s, much improved from her initial admission.  She is not tachycardiac.  GENERAL:  The patient is in no acute distress, alert and oriented.  HEENT:  Normocephalic, atraumatic.  Extraocular muscles intact.  NECK:  Is supple.  No lymphadenopathy.  LUNGS:  Are clear to auscultation bilaterally.  CARDIOVASCULAR:  Regular rate and rhythm.  ABDOMEN:  Flat, soft, nontender, nondistended.  EXTREMITIES:  No clubbing, cyanosis or edema.   LABORATORY VALUES:  On admission, white blood cell count is 8.3,  hemoglobin 6.6, platelets at 223.  Sodium 140, potassium 4.4, chloride  108, CO2 is 30, BUN 13, creatinine 0.9, glucose 102.   IMPRESSION:  1. Recurrent gastrointestinal bleeding.  2. Other multiple medical problems.  She did not have any clear sites      of bleeding initially.  However, a repeat evaluation is required      since she was hypotensive and her hemoglobin has significant drop.      Further recommendations will be made pending the findings of the      esophagogastroduodenoscopy.      Jordan Hawks Elnoria Howard, MD  Electronically Signed     PDH/MEDQ  D:  09/09/2008  T:  09/09/2008  Job:  [id number]

## 2011-03-30 NOTE — Consult Note (Signed)
[name], [name] NO.:  [id number]   MEDICAL RECORD NO.:  [id number]          PATIENT TYPE:  INP   LOCATION:  1825                         FACILITY:  [facility]   PHYSICIAN:  Petra Kuba, M.D.    DATE OF BIRTH:  09/29/1956   DATE OF CONSULTATION:  09/24/2006  DATE OF DISCHARGE:                                   CONSULTATION   HISTORY:  Patient seen at the request of Dr. Oletta Lamas, the ER physician and Dr.  Nehemiah Settle for upper GI bleeding.  She had recently been admitted with a  pulmonary embolus, was on Coumadin, ran out of her medicine, had increased  leg pain, had been taking lots of aspirin, threw up blood and passed out  today.  She has continued to throw up blood and had some black tarry stools,  which just started today.  I am consulted for further workup and plans.   PAST MEDICAL HISTORY:  Pertinent for:  1. Drug abuse with cocaine.  2. Schizophrenia.  3. Bouts with pneumonia.  4. High blood pressure.  5. As well as the recent pulmonary embolus.   FAMILY HISTORY:  Negative for any GI problems.   CURRENT MEDICATIONS:  Had been Coumadin, Diovan, clonidine, Lyrica, Lipitor,  Risperdal, Coreg and another medicine mitaz, which I cannot read the rest of  the writing on the bottle.   SOCIAL HISTORY:  She smokes, but does not drink.  Does do drugs.  Did take a  lot of aspirin as above.   REVIEW OF SYSTEMS:  Pertinent for feeling real bad and mostly pain in her  legs.   PHYSICAL EXAMINATION:  GENERAL:  Patient lethargic, but answers all  questions appropriately.  VITAL SIGNS:  See chart.  LUNGS:  Clear.  HEART:  Regular rate and rhythm.  ABDOMEN:  Soft, nontender.  Obvious melena.   LABS:  Pertinent for a hemoglobin of 10.7, with an increased BUN of 42,  normal creatinine.  INR is 1.1 with a PT of 14.4.   ASSESSMENT:  1. Upper gastrointestinal bleeding in a patient on aspirin who stopped her      Coumadin.  2. Multiple other medical problems, including  drug abuse.   PLAN:  The risks, benefits and methods of endoscopy were discussed.  We will  go ahead and transfuse her and then proceed with an endoscopy as soon as  possible with further workup and plans pending those findings.  I have  discussed this procedure with her and her sister, who agree.           ______________________________  Petra Kuba, M.D.     MEM/MEDQ  D:  09/24/2006  T:  09/25/2006  Job:  [id number]   cc:   Casimiro Needle B. Sherene Sires, MD, Tonny Bollman  Deirdre Peer. Polite, M.D.

## 2011-03-30 NOTE — Op Note (Signed)
NAMETONEA, LEIPHART NO.:  [id number]   MEDICAL RECORD NO.:  [id number]          PATIENT TYPE:  INP   LOCATION:  2315                         FACILITY:  [facility]   PHYSICIAN:  Thornton Park. Daphine Deutscher, MD  DATE OF BIRTH:  September 09, 1956   DATE OF PROCEDURE:  09/25/2006  DATE OF DISCHARGE:                                 OPERATIVE REPORT   PREOPERATIVE DIAGNOSIS:  Exsanguinating hemorrhage from stomach.   POSTOPERATIVE DIAGNOSIS:  Actively bleeding Dieulafoy arterial lesion, the  upper stomach with gastric perforation.   PROCEDURE:  Exploratory laparotomy, gastrotomy and oversewing Dieulafoy  arterial lesion, closure of gastric perforation along lesser curvature,  closure of the small bowel enterotomy.   SURGEON:  Thornton Park. Daphine Deutscher, MD   ASSISTANT:  Velora Heckler, MD.   ANESTHESIA:  General endotracheal.   BLOOD LOSS:  Ongoing from stomach about 3 liters of blood   DESCRIPTION OF PROCEDURE:  [name] was taken to OR 16 on the evening of  11/13 through the 11/14 early morning hours.  Exploratory lap was performed  through an upper midline incision.  The abdomen was massively distended.  I  entered and it had a lot of pneumoperitoneum.  There was not any soilage or  gastric juice leakage noted, but the stomach was massively distended.  Small  bowel popped out of the abdomen quickly and was massively distended with air  as well.  I made a gastrotomy with a GIA and then held the stomach down and  then evacuated multiple huge clots and finally with sucking out bright red  arterial blood.  I walked my way up into the cardia from within and was  luckily was able to see an arterial pumper and with a visible vessel that  was quite big and was pumping very rapidly.  I was able to control this  hemorrhage with a figure-of-eight suture of 2-0 silk.  This arrested  hemorrhage.  There was another little area that looked like it could  possibly be another Dieulafoy but it was not  actively bleeding and I  oversewed it as well with a silk.   Irrigated and Dr. Gerrit Friends helped me review this area, we did not see any  other areas of bleeding.  I closed the gastrotomy with a Connell fashion  with 4-0 PDS and external Lemberts of 2-0 silk.  We then clamped off the  stomach and insufflated with air and found just faint bubbles along the  lesser curve.  I opened that area up and found a linear lesion about an inch  long.  This was closed in one layer with interrupted 2-0 silks.   This lay right up against the stomach and is hopefully going to seal off.  In addition, I found a little tiny hole in the small bowel that was about 2-  3 mm.  I oversewed that with a U stitch of 3-0 silk and with an imbricated  silk over it.  We irrigated with saline.  No other bleeding or leaks were noted.  The wound  was closed with some difficulty  owing to her massive distension with a  running #1 PDS and two retention sutures were placed in the middle.  The  patient tolerated procedure well, was taken to recovery room in satisfactory  condition.      Thornton Park Daphine Deutscher, MD  Electronically Signed     MBM/MEDQ  D:  09/25/2006  T:  09/25/2006  Job:  [id number]

## 2011-03-30 NOTE — H&P (Signed)
[facility]  Patient:    [name], [name]                       MRN: [id number] Adm. Date:  [id number] Attending:  Miguel Aschoff CC:         Charlaine Dalton. Sherene Sires, M.D. [facility]   History and Physical  DATE OF BIRTH:  September 23, 1956  CHIEF COMPLAINT:  "Im getting sick again."  HISTORY OF PRESENT ILLNESS:  [name] is a 55 year old woman who was hospitalized from February 5th through 19th for community-acquired multilobar pneumococcal pneumonia with respiratory failure and ARDS, requiring mechanical ventilation.  Her convalescence was unremarkable, and followup x-ray on February 19th showed near complete resolution of her infiltrates, with right lower lobe infiltrate faintly persisting.  Aside from deconditioning requiring home physical therapy, she has done well.  At time of discharge, she was breathing well with only occasional nonproductive cough, no shortness of breath or dyspnea on exertion, no chest pain, with recovered appetite and improvement in energy.  Last week, however, she developed cold symptoms again, characterized by nasal congestion, scratchy throat and cough occasionally productive with cloudy pale-yellow sputum.  This has been followed by progressive shortness of breath and orthopnea, right lower anterior/posterior chest discomfort, subjective fevers and chills, poor appetite and generalized weakness.  She was seen for her first hospital followup visit by Earley Favor, RN, nurse practitioner, at [facility].  She will see Dr. Casimiro Needle B. Wert for her next followup next week.  She is not yet established with a local primary care physician, and is confused about whom to consult for questions regarding her medical illness.  PAST MEDICAL HISTORY:  Childbirth 23 years ago.  No adult hospitalizations, surgeries, chronic medical problems.  At last hospitalization, hypertension and type 2 diabetes mellitus were noted, but values were  normalized without therapy at the time of discharge and no further therapy was required at the time.  SOCIAL HISTORY:  Patient is engaged.  She has a 46 year old daughter.  Smokes two to three cigarettes per day, down from one to one and a half packs per day previously.  She drinks alcohol but not to excess, has never used IV drugs and denies high-risk social behavior.  FAMILY HISTORY:  Mother died of pneumonia.  MEDICATIONS: 1. Pepcid 20 mg p.o. b.i.d. (does not take consistently). 2. Methadone 5 mg p.o. b.i.d., which she explains was prescribed by    Dr. Onalee Hua B. Simonds, although I cannot find verification of this.  ALLERGIES:  None known.  PHYSICAL EXAMINATION:  VITAL SIGNS:  Temperature 98.1; blood pressure initially 193/128, with drop to 165/85; pulse 110, sinus tachycardia; respiratory rate 32; pulse oximeter 93% on room air.  GENERAL:  The patient is a very young-appearing 55 year old who does not appear chronically ill, but today is mildly uncomfortable.  She has a slight build, well-nourished.  HEENT:  Her face is symmetric with moist oral mucosa.  The conjunctivae are muddy but not injected nor icteric.  NECK:  She has mild cervical adenopathy primarily over the tonsillar nodes, mobile.  No JVD.  Neck is supple.  LUNGS:  No increased work of breathing while wearing 2 L oxygen per nasal cannula.  Exam was notable for bronchial breath sounds over the right base, dense crackles over the right midaxillary line, a few crackles over left base, no wheezes or rhonchi, no egophony, dullness to percussion over right base.  HEART:  Regular rate and rhythm  with no significant murmur, rub or gallop.  ABDOMEN:  Soft and nontender with no mass.  EXTREMITIES:  Lower extremities without edema, symmetric.  Strong and symmetric pulses.  SKIN:  Without rash, warm and dry.  JOINTS:  Without inflammation.  LABORATORY AND X-RAY FINDINGS:  ABG on room air:  PCO2 37, PO2 55,  bicarb 25.2, pH 7.45.  Sodium 139, potassium 3.4, chloride 104, bicarb 27, glucose 169, BUN less than 5, creatinine 0.7, bilirubin 0.5, albumin 3.5 (increased from 1.9, last admission).  No CBC was ordered in the emergency room.  Chest x-ray reveals new right upper lobe infiltrate and right pleural effusion versus infiltrate versus both, suggestion of infiltrate in left base, cardiac shadow somewhat enlarged.  ASSESSMENT AND PLAN: 1. [name] has recurrent pneumonia, now involving the right upper lobe    which was not involved last admission.  Given recent hospitalization, must    consider and empirically cover hospital-acquired organisms.  Will obtain    sputum culture, blood culture with fever spike and treat with vancomycin    and imipenem until culture results returned.  Will treat with guaifenesin    and albuterol to mobilize sputum.  There was no significant bronchospasm.    Fortunately, the severity of her illness is moderate compared to last    admission.  I will inform Dr. Sherene Sires of her admission and invite his opinions    on her new developments.  Oxygen per nasal cannula will be sufficient at    this time.  Close observation for decompensation will occur, given her    recent history. 2. [name] states that she has been taking methadone prescribed by    Dr. Sung Amabile as a way of weaning off the narcotics required during her last    hospitalization.  She is currently at 5 mg q.d.  In order to prevent    withdrawal, I will continue this dose, but will make sure that we have    records to substantiate continuation from the pulmonology service. 3. Hyperglycemia:  Will obtain hemoglobin A1c.  At last admission, she    appeared to have a glucose intolerance pattern only, but will again treat    with sliding-scale insulin, limit calories, and investigate further. 4. Hypertension:  Her acute exacerbation on admission was secondary to    respiratory discomfort, and she is improving  with time as her anxiety     decreases.  Will begin medication if needed for persistence of hypertension    at baseline. DD:  01/21/01 TD:  01/22/01 Job: [id number]

## 2011-03-30 NOTE — Discharge Summary (Signed)
NAMEAVANNA, Simmons NO.:  [id number]   MEDICAL RECORD NO.:  [id number]          PATIENT TYPE:  INP   LOCATION:  1421                         FACILITY:  [facility]   PHYSICIAN:  Vella Raring, MD DATE OF BIRTH:  [birth year]   DATE OF ADMISSION:  09/03/2005  DATE OF DISCHARGE:  09/10/2005                                 DISCHARGE SUMMARY   REASON FOR ADMISSION:  Ms. Simmons is a 55 year old female with a history of  hypertension and cocaine abuse who presented with hemoptysis and left sided  pleuritic chest pain. On CAT scan, the patient was found to have a pulmonary  embolism. On CAT scan dated September 03, 2005, impression was acute pulmonary  emboli to right upper lobe pulmonary artery and minimal to right lower lobe  pulmonary artery. She also had air space consolidation of left upper lobe  consistent with pneumonia or pulmonary hemorrhage in this patient with  hemoptysis.   HOSPITAL COURSE:  The patient was admitted and started on heparin drip and  Coumadin. Her blood pressure medicine was restarted. She started on  hydrochlorothiazide 25 mg p.o. once daily, Norvasc 10 mg p.o. once daily  with good control of her blood pressure. The patient remained in stable  medical condition. On room air, she was satting 96-98% on room air. She was  in no respiratory distress. She continued to have some pleuritic chest pain  which was managed with pain medicine. The patient received Percocet 2 pads  p.o. q.6 h p.r.n. for pain control. The patient had a hypercoagulability  workup initiated in the hospital. As far as risk factors, the patient stated  she was somewhat immobile or not as active secondary to being depressed but  denied any use of oral contraceptives. No known history of malignancy or  prolonged travel or no history of known clotting disorders in her family.  However, the patient's daughter has also had a pulmonary embolism. It is  unclear whether the patient's  daughter was taking oral contraceptives at  that time.   LABORATORY DATA:  Protein C and S were not checked because the patient had  already initiated Coumadin therapy. Protein C and S levels will need to be  checked once the patient is not on Coumadin. She had her levels of  antithrombin 3, lupus anticoagulant, beta 2 glycoprotein and cardiolipin  antibody checked. The beta 2 glycoprotein levels were normal, cardiolipin  antibody IgG and IgM was also normal. Homocystine level was 14 which was  normal. Lupus anticoagulant was retested. This can be repeated at a later  date; however, the patient does not give any history of early fetal loss.   OTHER SIGNIFICANT LAB DATA:  The patient's urine tox was positive for  cocaine and opiates on admission. A full report of her CT angiogram, there  is again an acute pulmonary emboli to the right upper lobe, pulmonary artery  and mainly to the right lower lobe pulmonary artery. No evidence of  pulmonary emboli elsewhere. Air space consolidation in the left upper lobe  consistent with pneumonia or pulmonary hemorrhage in  this patient with  hemoptysis, ____________ pleural effusion and cardiomegaly with left  ventricular predominance.   CONDITION ON DISCHARGE:  The patient is stable, not in any respiratory  distress. She does still continue to have some pleuritic chest pain.   DISCHARGE INSTRUCTIONS:  The patient has not had any routine mammogram,  pelvic exam and will be due for a colonoscopy in a year as she is 55 years  old. She was instructed that she should have routine screening that would  detect any possible malignancy such as mammogram, pelvic exam. She did have  a breast exam done by me during the hospital course that did not show any  masses. Upon discharge, the patient was given an appointment with [facility] for November 2 at 11:30. She was not eligible to have a prescription  filled because she had had them filled prior in March of  2006. However, the  patient was given information so that she will be able to obtain some of her  medicines through the $4 drug program. The patient was on Norvasc during  this admission. However, I will change her blood pressure medicine on  discharge because that is not covered under the [facility] plan. She will be  discharged on Coumadin.   DISCHARGE MEDICATIONS:  She will be discharged on:  1.  Coumadin 4 mg p.o. daily.  2.  Clonidine 0.2 mg p.o. twice daily.  3.  Lisinopril 25 mg p.o. daily.   Lisinopril and hydrochlorothiazide is go in a combination. The patient will  be discharged on combination medicine.   DISCHARGE DIAGNOSES:  1.  Pulmonary embolism.  2.  Hypertension.  3.  History of drug abuse.  4.  Cocaine.   OTHER DISCHARGE INSTRUCTIONS:  Patient instructed on the importance of  taking her Coumadin daily because of the pulmonary embolism. She was also  instructed that she must have her INR levels monitored because of the risk  of bleeding or risk of subtherapeutic level. Also in following up with  [facility] and taking her blood pressure medicines.           ______________________________  Vella Raring, MD     LNB/MEDQ  D:  09/10/2005  T:  09/10/2005  Job:  [id number]

## 2011-03-30 NOTE — Op Note (Signed)
[name], DAMRON NO.:  [id number]   MEDICAL RECORD NO.:  [id number]          PATIENT TYPE:  INP   LOCATION:  1825                         FACILITY:  [facility]   PHYSICIAN:  Bernette Redbird, M.D.   DATE OF BIRTH:  1956/08/04   DATE OF PROCEDURE:  09/24/2006  DATE OF DISCHARGE:                                 OPERATIVE REPORT   PROCEDURE:  Upper endoscopy.   INDICATIONS:  55 year old unassigned patient recently released from the  hospital on Coumadin because of a DVT and pulmonary embolism, who ran out of  her Coumadin, redeveloped leg pain and took a large amount of aspirin,  presented to the hospital with melena and then large volume hematemesis to  the point of essentially coding and having to be intubated in the emergency  room.  She has had 4 units of blood so far.  Please see dictated  consultation note by Dr. Vida Rigger for further details.   FINDINGS:  Large amount of blood in the stomach.  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   PROCEDURE:  The procedure been discussed with the patient and her sister,  the latter of whom provided written consent on the patient's behalf.  This  discussion was performed by Dr. Ewing Schlein as part of the initial consultation.  I am doing this procedure is an on-call procedure.   The procedure was done at the bedside in the emergency room.  The patient  was intubated already and on the ventilator and sedated per the ER sedation  ventilator sedation protocol.  The Olympus standard adult video endoscope  was passed under direct vision alongside the NG tube which was kept in  place.   The esophagus was full of blood, including both liquid red and clotted  blood.  Just above the GE junction was a large mucosal defect measuring  approximately 1.5 or 2 cm in width and about 3-4 cm in length, suggestive of  the Mallory-Weiss tear, although it was difficult to keep the esophagus and  adequately insufflated to allow optimal inspection.  There  was no discrete  stigma of hemorrhage, nor any evident bleeding from this lesion.  I did not  see any evidence of varices.   The stomach was entered.  It contained a massive gelatinous bright red clot  which occupied the majority of the gastric lumen.  Where visualized, the  gastric mucosa was pale, but free of any evident ulcers.  However, since the  majority of the gastric mucosa was not visualized, I cannot by any means  exclude the presence of the gastric ulcer on this exam.  The scope was able  be advanced more or less blindly through the pyloric channel into the  duodenum which had some fresh liquid red blood and some clot in the duodenal  lumen and pyloric channel but I did not see any evidence of duodenal  ulceration or active bleeding in that area.   Retroflexed viewing of the cardia of the stomach simply showed a large  amount of clot.   After rather lengthy inspection going on for about a 20  or 25 minutes, the  scope was removed from the patient without additional findings being  identified.   IMPRESSION:  1. No definite active bleeding seen.  2. Massive fresh clot occupying the majority of the distal esophagus,      stomach and pyloric channel preventing visualization of the majority of      mucosa.  3. Probable large Mallory Weiss tear above the GE junction.  It is      possible that this does account for the patient's recent bleeding.  4. No evident varices or obvious ulcers in the visualized portion of the      stomach and duodenum, keeping in mind the limited visualization as      described above.   PLAN:  1. Intensive antipeptic therapy.  2. IV Reglan to help empty the stomach of the blood.  3. Consider possible repeat endoscopy in the morning for better      visualization.  4. Consider surgical consultation.  I will attempt to discuss this with      the attending physician, Dr. Renford Dills.           ______________________________  Bernette Redbird,  M.D.     RB/MEDQ  D:  09/24/2006  T:  09/25/2006  Job:  [id number]   cc:   [facility]

## 2011-03-30 NOTE — Discharge Summary (Signed)
[facility]  Patient:    Tracy, Simmons                       MRN: [id number] Adm. Date:  [id number] Disc. Date: [id number] Attending:  Avie Echevaria                           Discharge Summary  DISCHARGE DIAGNOSES: 1. Recurrent pneumonia-like syndrome, unclear etiology with secondary    hypoxemic respiratory failure, resolving. 2. Residual pleural thickening, status post recent pneumonia resulting in    adult respiratory distress syndrome mechanical ventilation with negative    ultrasound this admission for free effusion. 3. Hypertension, difficult to control, associated with anxiety, responding to    a combination of clonidine and Plendil. 4. 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 5. History of cigarette addiction.  HISTORY OF PRESENT ILLNESS:  This is a 55 year old, African-American female who was just discharged from [facility] on December 31, 2000, after an acute pneumonia-like syndrome resulting in mechanical ventilation.  At the time she was originally admitted, it was very difficult to pinpoint when her illness began and the admitting physician, Dr. Evlyn Kanner, had contemplated HIV disease, but was not able to identify any HIV risk factors nor were we.  We treated her empirically for pneumonia and only had one positive culture, pneumococcal from the sputum only.  She did appear to respond to a short course of steroids and low stretch mechanical ventilation.  At the time of discharge, she continued to be somewhat narcotic dependent for multiple complaints and was placed on methadone by Dr. Sung Amabile.  She also resumed smoking.  HOSPITAL COURSE:  She was readmitted on January 21, 2001, with cough productive of slightly yellow sputum associated with dyspnea and generalized chest discomfort, right greater than left, subjective fevers and new infiltrates especially  in the right upper lobe at the time of admission.  However, she had no documented fever during this admission, nor significant leukocytosis and all cultures remained negative.  I was concerned about a right pleural effusion developing, but by ultrasound there was no free effusion.  The patient was then treated empirically with Primaxin and vancomycin by Dr. Charissa Bash and transferred to the pulmonary critical service where we stopped Primaxin after reviewing culture data and continued her on Tequin.  She received a total of seven days of antibiotic therapy and will discharge to complete three more empirically.  Chest x-ray has shown a stable pattern of infiltrates in the right upper lobe and pleural thickening in the right lower lobe.  However, her saturations are normal on room air and she is free of pain on deep breathing on the day of discharge and ambulating without difficulty.  She continues to complain of being anxious and her blood pressure has been difficult to control transiently (probably related to methadone withdrawal), but is now adequately controlled on the following regimen at discharge.  DISCHARGE MEDICATIONS: 1. Prilosec 20 mg one q.a.m. 2. Nicotine patch 14 mg per 24 hours. 3. Clonidine 0.2 mg t.i.d. 4. Vioxx 25 mg two daily x 5 days, then one to two daily as needed for pain    (this is not felt to be needed long-term and she will be given samples    only). 5. Plendil 5 mg one b.i.d. 6. Tequin 400 mg daily x 3 days only  and then stop.  FOLLOWUP:  Follow up in five days in the office with a chest x-ray.  DIET:  Low salt diet.  SPECIAL INSTRUCTIONS:  She absolutely not to smoke.  CONDITION ON DISCHARGE:  Markedly improved with no fever, chest pain, purulent sputum and adequate saturations on room air documented.  At this point, her specific diagnosis remains allusive.  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  ACTIVITY:  Increased as tolerated. DD:  01/28/01 TD:  01/28/01 Job: [id number]

## 2011-03-30 NOTE — Discharge Summary (Signed)
NAMETERRILYNN, [name] NO.:  [id number]   MEDICAL RECORD NO.:  [id number]          PATIENT TYPE:  INP   LOCATION:  3002                         FACILITY:  [facility]   PHYSICIAN:  Thornton Park. Daphine Deutscher, MD  DATE OF BIRTH:  1956/09/01   DATE OF ADMISSION:  09/24/2006  DATE OF DISCHARGE:  10/08/2006                               DISCHARGE SUMMARY   CHIEF COMPLAINT:  Exsanguinating hemorrhage.   HISTORY:  This is a patient that I am asked to dictate a discharge  summary on that I operated on, I really only saw at that time during the  hospitalization.  She presented to me via the ICU where she had been  endoscoped and apparently had suffered an endoscopic blowout of her  upper abdomen with marked free air, but more importantly suffering from  exsanguinating hemorrhage.   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   She received intensive medical support therapy following this by the CCS  Team including Dr. Maryagnes Amos, Dr. Carolynne Edouard, and Dr. Lindie Spruce and other members of  CCS Team and eventually was ready for discharge.   FINAL DIAGNOSIS:  As above, can for further details of the complicated  medical history, refer to her very large chart.  Most importantly the  patient was discharged stable, to be followed up in the office in 3  weeks.      Thornton Park Daphine Deutscher, MD  Electronically Signed     MBM/MEDQ  D:  02/26/2007  T:  02/26/2007  Job:  [id number]

## 2011-03-30 NOTE — Consult Note (Signed)
[facility]  Patient:    [name], [name]                       MRN: [id number] Proc. Date: 01/21/01 Adm. Date:  [id number] Attending:  Miguel Aschoff                          Consultation Report  REQUESTING PHYSICIAN:  Charissa Bash, M.D.  REASON FOR CONSULTATION:  Possible recurrent pneumonia.  HISTORY OF PRESENT ILLNESS:  This is an exceptionally complicated 55 year old black female smoker who was admitted to [facility] December 17, 2000 through December 31, 2000 with a history of progressive dyspnea over approximately a week prior to admission associated with symptoms of a URI with purulent sputum production.  She was initially treated by Dr. Ardyth Harps and then referred to the critical care service and was also seen by the infectious disease service. The only positive culture was for pneumococcus.  Her HIV status was negative. Despite aggressive treatment for pneumococcus, as well as all forms of community-acquired infection, she progressively evolved to a pattern of ARDS that was refractory and was treated with a low-stretch approach but remained vent dependent, until steroids were started empirically for features consistent with fibroproliferative changes.  This caused elevation of glucose but it seemed to be the turning point in her course but, because of elevations of blood sugar, steroids were stopped after approximately a week.  She was discharged on the 19th, after declining to be placed in rehab and taking only Pepcid 20 mg b.i.d., a nicotine patch, and methadone (because she had required high-dose narcotics during her hospitalization to control her ventilation, keep her synchronous with the ventilator).  She was seen on the 28th, doing fine, with saturation 95% on room air; however, she was no longer wearing the nicotine patch and she was beginning to smoke again when seen on the 28th in the office.  She states within a week of being  seen in the office she began to feel worse again with increasing dyspnea associated with a cough productive of yellow sputum and vague right-sided pleuritic chest pain.  She was seen in the emergency room yesterday by the ER staff and referred to the [facility] hospitalist, as she did not have a primary care doctor.  Dr. Charissa Bash asked me today if I would evaluate the patient for her, having done the majority of her care during her most recent hospitalization in the ICU.  At present, the patient continues to be mildly short of breath at rest and states she does hurt in the right posterior axillary line with deep breathing only.  She has no increased work of breathing on nasal prongs, however, and denies any dysphagia, choking on food, sinus complications, myalgias, arthralgias, vomiting, diarrhea, or illicit drug use.  PAST MEDICAL HISTORY:  She has essentially been healthy except for the hospitalization as outlined above.  ALLERGIES:  None known.  MEDICATIONS:  She has already been started back on Primaxin and vancomycin yesterday by Dr. Charissa Bash.  SOCIAL HISTORY:  She has resumed smoking, as noted above.  FAMILY HISTORY:  Negative for respiratory diseases or rheumatologic diseases.  REVIEW OF SYSTEMS:  Taken in detail and essentially negative except as noted above.  PHYSICAL EXAMINATION:  GENERAL:  This is a healthy-appearing black female with no increased work of breathing.  VITAL SIGNS:  She is afebrile.  She has had no fever  documented here on the floor since admission.  HEENT:  Oropharynx is clear.  Dentition is intact.  No evidence of posterior nasal drainage, sinus turbinates normal.  Ear canals were clear bilaterally.  NECK:  Supple without cervical adenopathy or tenderness.  Trachea was midline with no thyromegaly.  LUNGS:  Lung fields reveal a few inspiratory crackles in the right greater than the left base and dullness in the right base as well.  I  could not appreciate a definite rub, though.  CARDIAC:  Regular rate and rhythm without murmurs, gallops, or rubs.  ABDOMEN:  Soft, benign.  EXTREMITIES:  Warm without calf tenderness, cyanosis, or clubbing.  NEUROLOGIC:  No focal deficits.  Pathologic reflexes.  Her sensorium was normal.  SKIN:  Warm and dry.  No lesions.  LABORATORY DATA:  Chest x-ray and CT scan were reviewed from yesterday and indicate a new right upper lobe air space density.  There is a small right pleural effusion that was present on her previous admission.  She continues to have appearance of increased markings in the bases but these are really not changed from her discharge x-ray.  IMPRESSION: 1. Recurrent pattern of air space disease associated with slightly purulent    sputum and pleuritic pain.  This would certainly be consistent with    pneumonia and I am concerned about the possibility, as is Dr. Mayford Knife,    that her respiratory flora are hospital acquired, and therefore she does    need to be treated as a hospital-acquired infection.  Primaxin and    vancomycin are fine at this point.  The other possibility is that we    partially suppressed a fibroproliferative ARDS or BOOP pattern with a short    course of steroids.  It was terminated because of the problem with diabetes    and the difficulty of assessing whether or not there was active infection    still present at the time (which is always a concern with the use of    steroids in this setting).  I therefore favor no steroids for now and treat    her as an infection; however, I am going to obtain a baseline sed rate and    LDH and repeat her chest x-ray tomorrow.  I recommended that she continue    to be treated with IV Primaxin and vancomycin plus nebulizers and oxygen    titrated to a saturation greater than 92%. 2. Chronic methadone use.  She was being tapered on methadone before    admission.  The use of methadone was, as I understand it,  to "bring her    down slowly" from her narcotic dependence that had developed during the    hospitalization when we used large doses of Duragesic to help her     synchronize with the ventilator.  I would think that we could completely    taper this off and treat her pleuritic pain with something like Vioxx 50 mg    daily, which would improve her cough mechanics and reduce her risk of    recurrent infection on that basis. DD:  01/22/01 TD:  01/22/01 Job: [id number]

## 2011-03-30 NOTE — Discharge Summary (Signed)
[facility]  Patient:    [name], [name]                       MRN: [id number] Adm. Date:  [id number] Disc. Date: [id number] Attending:  Avie Echevaria Dictator:   Earley Favor, R.N., MSN, ACNP                           Discharge Summary  DATE OF BIRTH:  06-18-1956  DISCHARGE DIAGNOSES: 1. Vent-dependent respiratory failure with community-acquired pneumonia    and acute respiratory distress syndrome, and neutropenia. 2. Low-grade systemic inflammatory response syndrome.  PROCEDURE:  A 2-D echocardiogram demonstrated a normal LV function.  HISTORY OF PRESENT ILLNESS:  [name] is a 55 year old black female who had had a fever for several days with upper respiratory infectious-type symptoms. he was seen at a local Urgent Care and treated with antimicrobial therapy, without any improvement.  She presented to [facility] Emergency Department with increasing respiratory distress, and required admission and further evaluation and treatment.  LABORATORY DATA:  Sodium 138, potassium 5.3, chloride 101, CO2 of 31, glucose 86, BUN 14, creatinine 0.7, calcium 9.5.  Arterial blood gases on 100% room air: pH of 7.44, PCO2 of 34, PO2 of 43 on admission.  Arterial blood gases on 40% on the ventilator, pressure-related volume control at a rate of 10, 650 tidal volume, ith 5 of PEEP:  pH of 7.53, PCO2 of 38, PO2 of 92.  WBC 12.3, hematocrit 32.6, hemoglobin 10.7, platelets 240.  WBC reached a low on December 17, 2000, of 1.0,  with a peak of 12.3 on December 27, 2000.  Neutrophils 92, lymphs 4, ranged to .2.  HIV was noted to be negative.  ESR ranged from 2.5 to 73 to 2.0, and on December 23, 2000, to 97.  Sodium reached a peak of 167 on December 23, 2000, with BUN reaching a peak of 655 on December 23, 2000.  Blood culture demonstrated no growth.  Urine was negative for Legionella.  AFBs showed no AFBs by the day  of discharge. Respiratory culture showed rare streptococcal pneumonia, penicillin sensitive.   A portable chest x-ray on December 29, 2000:  The patient had been extubated, and no interval change of bibasilar pneumonia and/or atelectasis or probable small bilateral pleural infiltrates.  A CT of the abdomen and CT of the pelvis:  Impression of bilateral lung disease, right small effusion, small splenic infarction, colon ___  within the lumen of he aorta, just above the aortic bifurcation, which may be a prominent plaque or thrombosis.  Negative for bowel obstruction or bowel thickening.  A CT of the pelvis with contrast showed no acute abnormality of the pelvis.  Abdominal film shows a ET tube in the tip of the distal stomach.  A 2-D echocardiogram demonstrates normal LV function, with an ejection fraction of 55%-60%, mild aortic root dilatation, left atrium mildly dilated.  PROCEDURE: 1. Oral tracheal intubation on December 17, 2000, with extubation on    December 28, 2000. 2. Right internal jugular central venous catheter inserted on December 20, 2000, and removed on December 28, 2000.  HOSPITAL COURSE: #1 - VENT-DEPENDENT RESPIRATORY FAILURE WITH ACUTE RESPIRATORY DISTRESS SYNDROME:  [name] was admitted to [facility].  She was intubated on the same day on December 17, 2000, requiring mechanical ventilatory  support with a low stretch  approach, secondary to ARDS physiology.  She was successfully liberated from the ventilator on December 28, 2000.  She reached maximal hospital benefit on December 31, 2000, and requested to be discharged home. Of note, rehabilitation was offered to her, but she refused and wanted to be treated on an outpatient basis.  #2 - ADULT ONSET DIABETES MELLITUS, SECONDARY TO ACUTE ILLNESS AND STEROIDS: The glucose was noted to be elevated.  She responded to appropriate sliding scale insulin and did not require any further  treatment on an outpatient basis.  #3 - HYPERTENSION:  She was noted to be hypertensive while she was intubated, and was treated with clonidine.  Once she was extubated, she no longer required antihypertensive.  There was a question of the left ventricular function.  A 2-D echocardiogram revealed a normal left ventricular function.  #4 - DEBILITATION:  Due to the prolonged illness with the requirement of mechanical ventilatory support and steroids, she was noted to have debilitation.  She will be discharged home with outpatient rehabilitation.  DISCHARGE MEDICATIONS: 1. Peptic 20 mg one p.o. b.i.d. 2. Nicotine patch 14 mg one patch q.d. for seven days, then decrease to    7 mg, change q.d. for the next 14 days.  DIET:  No restrictions.  WOUND CARE:  Not applicable.  SPECIAL INSTRUCTIONS:  Home health has been arranged for her.  FOLLOWUP:  She has an appointment with Earley Favor, R.N., MSN, ACNP on January 09, 2001, at 2:15 p.m.  DISPOSITION/CONDITION ON DISCHARGE:  She is discharge home in an improved condition, and no longer requiring mechanical ventilatory support.  She is weakened secondary to her prolonged illness, and will receive home health to address this. DD:  12/31/00 TD:  12/31/00 Job: [id number]

## 2011-03-30 NOTE — H&P (Signed)
[name], MAROLF NO.:  [id number]   MEDICAL RECORD NO.:  [id number]          PATIENT TYPE:  EMS   LOCATION:  MAJO                         FACILITY:  [facility]   PHYSICIAN:  Deirdre Peer. Polite, M.D. DATE OF BIRTH:  11/03/1956   DATE OF ADMISSION:  09/24/2006  DATE OF DISCHARGE:                              HISTORY & PHYSICAL   CHIEF COMPLAINT:  Vomiting up blood, loss of consciousness.   HISTORY OF PRESENT ILLNESS:  This is a 55 year old female with multiple  medical problems which include history of PE, supposedly on systemic  Coumadin, congestive heart failure, hypertension, schizophrenia,  restless leg syndrome, hypercholesterolemia, who was brought to the  emergency department via EMS.  According to the patient she was in her  usual state of health, she was at the social services building trying to  get assistance and while there the patient described that she felt sick  and then shortly after felt like she had to go to the bathroom to have a  bowel movement.  The patient went to the bathroom and stated that she  passed out.  Upon waking up, the patient stated that she vomited copious  amounts of red blood.  The patient was brought to the ED via EMS and  they reported continued emesis via EMS.  In the ED the patient was  evaluated with systolic blood pressure 112, pulse 92.  While in the ED  the patient was noted to have a tarry stool x1.  The patient had STAT  work which revealed a BMET significant for BUN of 44, creatinine of 1.1.  CBC with white count 9.2, hemoglobin 10, platelet count 206,000.  INR  1.1.  Urinalysis with specific gravity 1.022, nitrite negative, many  epithelial cells, 7 to 10 white blood cells, few bacteria.  The patient  had a chest x-ray with acute infiltrate.  [facility] Hospitalist called for  further evaluation.  At the time of my arrival the patient was in the  bed complaining of feeling cold and describing events as stated above.  The  patient denies ever having a history of upper gastrointestinal  bleed.  She denies chronic NSAID use, however, states that she did take  Ecotrin or Excedrin last P.M. because her legs were hurting.  Again,  patient denies any chronic NSAID use, denies alcohol abuse, denies  having a history of peptic ulcer disease.  The patient stated she had  been without her Coumadin x2 days, ironically, her INR is within normal  limits.  Admission is deemed necessary for further evaluation and  treatment of syncope associated with upper gastrointestinal bleeding.   PAST MEDICAL HISTORY:  As stated above.   MEDICATIONS:  On admission include:  1. Coumadin 4 mg daily.  2. Diovan 100 mg daily.  3. Clonidine 0.2 mg two times a day.  4. Lyrica 75 mg b.i.d.  5. Coreg 3.125 mg b.i.d.  6. Lipitor 20 mg daily.  7. Mirtazapine 15 mg q.h.s.  8. Risperdal 1 mg q.h.s.   SOCIAL HISTORY:  Positive for tobacco, one-half pack per day.  Patient  denies alcohol.  Does admit to crack cocaine use.   PAST SURGICAL HISTORY:  Denies.   ALLERGIES:  None.   FAMILY HISTORY:  Mother none.  Father with diabetes, MI, CVA.  Sister  with heart trouble.   REVIEW OF SYSTEMS:  As stated in the HPI.   PHYSICAL EXAMINATION:  GENERAL APPEARANCE:  Currently the patient is in  mild to moderate distress, complaining of feeling cold and with  continued complaints of abdominal discomfort as if she has to go to the  bathroom.  VITAL SIGNS:  Systolic blood pressure 112.  Pulse 86.  Respiratory rate  28.  Please note vital signs on Triage ER sheet show blood pressure  150/90, pulse 90, respiratory rate 20.  HEENT:  Pale sclerae. Moist oral mucosa.  NECK:  No nodes, no JVD.  CHEST:  Clear.  CARDIOVASCULAR:  Regular.  ABDOMEN:  Soft without masses, nondistended.  EXTREMITIES:  Upper extremities are cool.  Lower extremities with no  edema.  NEUROLOGICAL:  Nonfocal.   LABORATORY DATA:  As stated in the history of present  illness.   ASSESSMENT:  1. Upper gastrointestinal bleed with melanotic stool in the emergency      department.  2. History of pulmonary embolus with normal INR on admission.  3. History of schizophrenia.  4. Cocaine abuse.  5. Congestive heart failure by history.  6. Dyslipidemia.  7. Restless leg syndrome by history.  8. Hypertension.  9. Syncope prior to upper gastrointestinal bleed.   RECOMMENDATIONS:  Recommend patient be admitted to intensive care unit.  Patient will have serial H and H, will obtain coag's, which have been  done, will get a STAT gastroenterology evaluation.  Due to the patient's  syncopal spell and description of large, voluminous blood as well as  melanotic stool, may warrant beginning of transfusion sooner than later.      Deirdre Peer. Polite, M.D.  Electronically Signed     RDP/MEDQ  D:  09/24/2006  T:  09/24/2006  Job:  [id number]

## 2011-03-30 NOTE — Consult Note (Signed)
NAMEANZLEE, HINESLEY NO.:  [id number]   MEDICAL RECORD NO.:  [id number]          PATIENT TYPE:  INP   LOCATION:  2315                         FACILITY:  [facility]   PHYSICIAN:  Coralyn Helling, MD        DATE OF BIRTH:  August 27, 1956   DATE OF CONSULTATION:  09/24/2006  DATE OF DISCHARGE:                                   CONSULTATION   REFERRING PHYSICIAN:  Dr. Deirdre Peer. Polite.   REASON FOR CONSULTATION:  Hemorrhagic shock.   Ms. Maiorino is a 55 year old female who has a history of PE and apparently  was on Coumadin up until about the last two or three days.  She apparently  was at the [employer] office and felt some abdominal discomfort and  went to the bathroom and briefly had loss of consciousness.  She was found  to have hematemesis.  She arrived to the emergency room at 1355 and she had  progressive decrease in blood pressure associated with bradycardia.  She  continued to vomit bright red blood and she subsequently required intubation  and mechanical ventilation.  She has so far received approximately 2 liters  of normal saline in addition to 2 units of packed red blood cells.   PAST MEDICAL HISTORY:  1. Significant for congestive heart failure.  2. Hypertension.  3. Pulmonary embolism approximately one year ago.  4. Schizophrenia.  5. Polysubstance abuse.   MEDICATIONS:  1. Coumadin, although not sure when the last time she took this.  2. Diovan 160 mg daily.  3. Clonidine 0.4 mg daily.  4. Lyrica 150 mg daily.  5. Coreg 12.5 mg daily.  6. Lipitor 20 mg daily.  7. Metazapine 50 mg q.h.s.  8. Risperdal 1 mg q.h.s.   She has no known drug allergies.   FAMILY HISTORY:  Not available at this time.   SOCIAL HISTORY:  She smokes a half-pack of cigarettes per day and she admits  to using crack cocaine as well.   REVIEW OF SYSTEMS:  Unable to obtain.   PHYSICAL EXAM:  Temperature is 97.7, heart rate is 97, blood pressure was as  low as 87/71,  respiratory rate is 12 oxygen saturation is 100%.  HEENT:  She has an NG tube in place and an endotracheal tube in place.  She  has bloody drainage from the NG tube.  There is no lymphadenopathy, no  thyromegaly.  HEART:  S1-S2.  CHEST:  She has good air entry bilaterally.  ABDOMEN:  Was soft, decreased bowel sounds.  EXTREMITIES:  Peripheral pulses are palpable.  There is no edema, cyanosis,  clubbing.  NEUROLOGIC EXAM:  She had received sedatives and paralytics prior to  intubation.   LABORATORY:  Results show a WBC of 9.2, hemoglobin 10.8, hematocrit 32.3,  platelet count is 206.  Sodium is 139, potassium is 3.5, chloride is 107,  pO2 is 23, BUN is 42, creatinine 1.1, glucose is 202.  PT 14.4, INR is 1.1,  PTT 24, AST 20, ALT 8, ALP is 42, bilirubin 0.4, albumin 3.2, calcium 0.5.  Urine drug  screen was positive for cocaine.   IMPRESSION:  This is a 55-year female with an acute upper gastrointestinal  bleed.  1. She has hemorrhagic shock with a history of congestive heart failure.      She will be admitted to the intensive care unit.  I will check an EKG      and cardiac enzymes.  I will continue her on volume resuscitation with      packed red blood cells as needed as well as normal saline.  She is to      continue on Protonix intravenously.  A gastroenterology consult has      already been placed and she may also need surgical evaluation depending      upon findings from endoscopy.  I will also place a central venous line      and monitor her CVPs to help guide fluid resuscitation.  2. Ventilator dependent respiratory failure with acute respiratory failure      secondary to  #1.  I will follow up on her chest x-ray as well as her ABG and make  ventilatory adjustments as needed.  I will also initiate her on the sedation  protocol.  1. Cocaine abuse:  Given her history of polysubstance abuse, I will check      an aspirin level, Tylenol level and an alcohol level.  2. History of  pulmonary embolism:  I am not sure exactly when the last      time she took her Coumadin but her INR was normal on admission today      and clearly she would not be able to take Coumadin in her current      condition.  I will start her on PAS hose for deep vein thrombosis      prophylaxis.  3. Schizophrenia:  I will give her Haldol as needed, in addition to      placing on sedation protocol while she is on the ventilator.  4. History of hypertension:  While she is actively bleeding and      hypotensive, I will hold her blood pressure medications but then she      will need to have these reinitiated when she is hemodynamically stable.  5. Hyperglycemia:  This could be related to stress response.  However, I      will place her on the sliding scale insulin protocol and monitor her      blood sugars.  6. History of tobacco abuse:  When she is hemodynamically stable, she will      need counseling with regards to smoking cessation.  Critical care time      spent was 50 minutes.      Coralyn Helling, MD  Electronically Signed     VS/MEDQ  D:  09/24/2006  T:  09/25/2006  Job:  [id number]

## 2011-03-30 NOTE — Discharge Summary (Signed)
NAMEASTRID, VIDES NO.:  [id number]   MEDICAL RECORD NO.:  [id number]          PATIENT TYPE:  INP   LOCATION:  0449                         FACILITY:  [name]   PHYSICIAN:  Charlaine Dalton. Sherene Sires, M.D. [facility] OF BIRTH:  [birth year]   DATE OF ADMISSION:  01/17/2005  DATE OF DISCHARGE:  01/20/2005                                 DISCHARGE SUMMARY   FINAL DIAGNOSES:  1.  Pulmonary infiltrates of unclear etiology, pneumonia versus cocaine      pulmonary toxicity.  2.  Poorly controlled hypertension.   HISTORY:  Please seen H&P.  This is a 55 year old black female who had  atypical pneumonia syndromes in the past twice without at least once  responding better to steroids than she did to antibiotics.  We had not  specifically asked her about cocaine but she admitted on admission this time  that she does use crack cocaine and has used it as recently as five days  prior to admission.  She again presented with an atypical pneumonia  syndrome with bilateral infiltrates with no definite evidence of infection.  She was nevertheless treated with a total of five days of Avelox and her  blood pressure was controlled with a combination of clonidine and Norvasc.  She seemed again at this time to do better once steroids were started and  she will have, therefore, a rapid taper off at the time of discharge.   At the time of discharge she has no pain, no dyspnea with slow activities  and saturating well on room air.  She has had no fever and her blood  pressure well controlled on her present regimen.   She will be followed in the office within the next week with a followup  chest x-ray and then referred to [facility] for long-term followup.   DISCHARGE MEDICATIONS:  1.  Avelox 400 mg daily to complete a total of five days of empiric therapy.  2.  Clonidine 0.2 mg b.i.d.  3.  Norvasc 5 mg one q.a.m.  4.  Prednisone 40 mg daily for two days, then one daily for two days, then  one-half daily for two days and stop.   Her chest x-ray shows only vague infiltrates in the right greater than the  left base and she will need a followup chest x-ray at one week.  She has  been cautioned about recurrent cocaine use as risking a permanent lung  injury.   DIET:  Low salt.   ACTIVITY:  Increased as tolerated.      MBW/MEDQ  D:  01/21/2005  T:  01/22/2005  Job:  [id number]

## 2011-03-30 NOTE — Discharge Summary (Signed)
[name], Tracy NO.:  [id number]   MEDICAL RECORD NO.:  [id number]          PATIENT TYPE:  INP   LOCATION:  1421                         FACILITY:  [facility]   PHYSICIAN:  Vella Raring, MD DATE OF BIRTH:  [birth year]   DATE OF ADMISSION:  09/03/2005  DATE OF DISCHARGE:  09/10/2005                                 DISCHARGE SUMMARY   ADDENDUM:  The patient will be discharged on Coumadin 4 mg p.o. daily.  The  patient's INR at discharge is 1.9, September 10, 2005; however, on October 28  and October 29, the patient's INR was 2.2 and 2.1 mg, and her Coumadin dose  was adjusted accordingly.  The patient will also get her INR checked and was  instructed to get her INR and Coumadin level checked this week and on a  regular basis.  Coumadin 4 mg p.o. daily was suggested via pharmacy, who was  following her for anticoagulation purposes and, again, on discharge, her  hemoglobin and hematocrit remains stable.           ______________________________  Vella Raring, MD     LNB/MEDQ  D:  09/10/2005  T:  09/10/2005  Job:  [id number]

## 2011-03-30 NOTE — H&P (Signed)
NAMEKADIA, ABAYA NO.:  [id number]   MEDICAL RECORD NO.:  [id number]          PATIENT TYPE:  INP   LOCATION:  0106                         FACILITY:  [facility]   PHYSICIAN:  Theone Stanley, MD   DATE OF BIRTH:  11/09/1956   DATE OF ADMISSION:  09/03/2005  DATE OF DISCHARGE:                                HISTORY & PHYSICAL   HISTORY OF PRESENT ILLNESS:  Mrs. Dallaire is a 55 year old African-American  female with known history of hypertension and cocaine abuse, presenting with  hemoptysis, left-sided chest pain.  The patient states that her left-sided  chest pain started in the left upper area, very sharp, increasing with deep  breathing.  It was originally in the left lower area with sharp pleuritic-  type chest pain, then went up to the upper chest area, again remained sharp  and pleuritic in nature.  The patient has shortness of breath; however,  because of her past medical history, she states she is always short of  breath.  In addition, one week ago she started to have hemoptysis.  Originally it was bright red blood in nature, now it is darker. Her pain  increases when she lies down, so she states he has not slept much since she  has to sit up most of the time.  No fever or chills.  The patient states  that she ran out of her medications a week ago.  She was getting her  medications illegally off the street, clonidine and Norvasc.  She also  states she is not taking any oral contraceptive medications.   PAST MEDICAL HISTORY:  1.  Hypertension.  2.  Drug abuse.  3.  History of bilateral pneumonia in 2002 which required prolonged      mechanical ventilation.   MEDICATIONS THE PATIENT HAS TAKEN:  1.  Clonidine 0.2 mg p.o. twice daily.  2.  Norvasc 10 mg 1 p.o. daily.  3.  Hydrochlorothiazide 25 mg once a day.   PAST SURGICAL HISTORY:  None.   ALLERGIES:  None.   FAMILY HISTORY:  Diabetes, hypertension.   SOCIAL HISTORY:  The patient lives in  [place], is single and has 1 grown  child.  She states she only smokes 1 pack of cigarettes a week.  She does  use crack cocaine at least 1 to 2 times a week. She states that she drinks  about 1 shot of alcohol daily; however, she states she has not had any for  about one week's time.   REVIEW OF SYSTEMS:  Please see HPI, otherwise all systems were negative.   PHYSICAL EXAMINATION:  VITAL SIGNS:  Temperature 98.5, blood pressure  originally 172/118.  The patient was given medication, and not it is  159/108, pulse 90, respiratory rate 18, saturating 100% on room air.  GENERAL:  Very pleasant 55 year old who appears stated age who is somewhat  distressed by the situation; however, she is in no respiratory distress.  HEENT:  Atraumatic and normocephalic.  Pupils equal and reactive.  Oropharynx clear.  NECK:  Supple.  HEART: Regular rate and rhythm.  No rubs or gallops heard.  LUNGS: Clear to auscultation bilaterally.  ABDOMEN: Soft, nontender, nondistended.  EXTREMITIES:  No edema, cyanosis, or clubbing.   LABORATORY DATA:  Radiology: To had a CT of the chest.  Impression: Acute  pulmonary embolus to the right upper lobe. Pulmonary artery and minimally  right lower lobe pulmonary artery no evidence of pulmonary embolus  elsewhere.  Air space consolidation in left upper lobe consistent with  pneumonia or pulmonary hemorrhage in this patient with hemoptysis.  Small  pleural effusion, cardiomegaly with left ventricular prominence.   Sodium 139, potassium 3.9, chloride 105, CO2 28, glucose 109, BUN 10,  creatinine 0.8, calcium 8.5.  White count 4.9, hemoglobin 13, hematocrit 41,  platelets 179.  D-dimer 1.23.  PT 12, INR 0.9.  Alcohol less than 5.  Total  protein 6.8, albumin 2.3, SGOT 38, ALT 13, alkaline phosphatase 66, total  bilirubin 0.6, indirect bilirubin 0.4.  Urine drug screen was positive for  opiates, cocaine.   ASSESSMENT AND PLAN:  Ms. Roorda is a 55 year old female  presenting with  hemoptysis, shortness of breath, and pleuritic chest pain.   1.  Acute pulmonary emboli to the right upper lobe and right lower lobe.  In      addition, air space consolidation or possibly pulmonary hemorrhage in      the left upper lobe.  At this point in time, we will admit her step down      since I will need to place her on heparin because of her pulmonary      embolism and continue to monitor her closely.  At this point in time      because there is no white count and no evidence of infection, I will not      place her on any antibiotics.  2.  Cocaine abuse.  The patient has a history of this. She will need      consultation in regards to this.  3.  Uncontrolled hypertension. This is secondary to a combination of cocaine      abuse and noncompliance with medications.  We will start her back on      clonidine, Norvasc, and p.r.n. metoprolol.  4.  Prophylaxis. The patient will be placed on heparin drip and Protonix.      Theone Stanley, MD  Electronically Signed     AEJ/MEDQ  D:  09/03/2005  T:  09/03/2005  Job:  [id number]   cc:   [facility]   Charlaine Dalton. Sherene Sires, M.D. [facility]  [address]  [place]  [place] [address]

## 2011-03-30 NOTE — H&P (Signed)
[facility]  Patient:    [name], [name]                       MRN: [id number] Adm. Date:  [id number] Attending:  Julian Hy                         History and Physical  HISTORY OF PRESENT ILLNESS:  The patient is a 55 year old black female with negative past medical history who has been ill for several days.  She was seen in the [facility] up to about a week ago and was given antibiotics and guaifenesin.  The antibiotics were "too strong" and she did not complete these.  Blood was drawn at this visit; we are not sure of the results of this.  In the interim, she has gotten worse and has had a 4-pound weight loss.  Dyspnea has become progressively worse and was particularly bad over the last 48 hours.  Pleuritic chest pain and pain with every breath started last evening; she came to the emergency room for this.  She has had minimal production with her sputum.  She has had no true chills and no measured fever.  She has no diarrhea or abdominal pain.  No numbness or tingling, although she has had some headache.  She denies any visual complaints.  She has had no arthralgia or myalgia or swollen joints.  She has noticed no rashes.  She was feeling well prior to the acute onset of this episode.  She did stop smoking three weeks ago as the prodrome of this illness began apparently.  She denies unusual travel or exposure other than a sister who is home with reported pneumonia at the present.  She has had no prior pneumonia or pulmonary disease.  She denies any history of immune system problems and specifically denies HIV risks and has never been tested for this. She has not had any routine medical care in her memory.  PAST MEDICAL HISTORY:  Childbirth 23 years ago.  She has had no adult hospitalizations, no surgeries, no chronic medical problems, no difficulties identified previously.  SOCIAL HISTORY:  She is engaged.  She has a  7 year old daughter.  She was a smoker until about two to three weeks ago.  She has had alcohol intake but denies to excess.  She denies any history of IV drug abuse or high-risk sexual behavior.  FAMILY HISTORY:  Mother died of pneumonia.  MEDICATIONS:  None on a chronic basis.  ALLERGIES:  None.  PHYSICAL EXAMINATION  VITAL SIGNS:  Blood pressure was 131/76, pulse 125, respirations were 28, temperature 97.7.  O2 saturation was 90% on 4 L.  GENERAL:  We have a thin, ill-appearing black female with grunting respirations noted.  HEENT:  Sclerae anicteric.  There is no lid lag or exophthalmos.  No nystagmus is present.  Oral mucous membranes were moist.  There was no evidence of fungal infection.  Dentition appears in fair repair.  NECK:  Supple with no JVD, bruits or thyromegaly.  No cervical adenopathy was noted.  LUNGS:  Lungs reveal consolidation over the lower right lung, a bit on the left base with rhonchi.  Accessory muscles are in use.  Respirations are grunting, as noted.  HEART:  Heart is tachycardic and regular, somewhat obscured by pulmonary sounds.  ABDOMEN:  Nondistended with hypoactive but present bowel sounds.  I could not feel any liver edge  and could not feel any spleen.  No masses or pulsations are noted.  EXTREMITIES:  Strong distal pulses with good nail bed perfusion.  No peripheral edema is present.  SKIN:  Warm and dry with no rashes or tattoos.  A few scars are noted.  NEUROLOGIC:  She is alert, anxious, a bit tearful, mentating well, answers my questions well.  Memory appears intact.  Slight resting tremor is present.  LABORATORY AND X-RAY FINDINGS:  Chest x-ray reveals patchy but significant consolidation in the lower two-thirds of the right lung and at the left base near the heart murmur.  WBC is 1000 with 84 neutrophils, 10 lymphs, 2 monos, 1 baso, 3 leukocytes; hemoglobin 13.9; platelets 133,000.  Room air blood gas:  PCO2 of 34, PO2  of 43.2, pH 7.449.  Sodium is 136, potassium 4.3, chloride 97, CO2 29, BUN 31, creatinine 1.4, glucose 120, total bilirubin is 0.8, alkaline phosphatase 27, SGOT is 46, SGPT is 9, albumin is 3.1.  SUMMARY:  In summary, we have a 55 year old black female, previously well, now presenting with multi-lobe pneumonia, marked hypoxia and leukopenia and granulocytopenia.  Picture is very worrisome for immune system dysfunction, especially human immunodeficiency virus, acquired immunodeficiency syndrome or related diseases.  Now clearly, she is at high risk for decompensation due to the low white count.  Blood cultures have already been done.  She will be put on neutropenic precautions.  She has already been started on Tequin and a dose of Bactrim will be given orally this morning.  Infectious disease will be asked to help in her management. DD:  12/17/00 TD:  12/18/00 Job: [id number]

## 2011-03-30 NOTE — H&P (Signed)
[name], [name] NO.:  [id number]   MEDICAL RECORD NO.:  [id number]          PATIENT TYPE:  EMS   LOCATION:  ED                           FACILITY:  [facility]   PHYSICIAN:  Charlaine Dalton. Sherene Sires, M.D. [facility] OF BIRTH:  1956/07/19   DATE OF ADMISSION:  01/17/2005  DATE OF DISCHARGE:                                HISTORY & PHYSICAL   CHIEF COMPLAINT:  Shortness of breath and flu-like symptoms.   HISTORY OF PRESENT ILLNESS:  This is a 55 year old black female who  presented today with a 3-day history of productive cough with yellow sputum,  nasal and sinus congestion, runny nose, positive wheeze, pleuritic chest  pain with deep inspiration in left posterior lung region as well as mid  sternal region.  Denies fever, but does complain of occasional chills.  Reports increased fatigue, generalized malaise, and shortness of breath.  The patient reports a baseline history of dyspnea on exertion which she  reports that at baseline, she can climb approximately 4-5 steps prior to  becoming short of breath, at which time she usually needs to stop and  recuperate before going on.  She reports today to the emergency room feeling  short of breath while sitting up in the upright position and reports  increased dyspnea brought on by just talking.  She denies chest pain with  any radiation, denies leg pain or swelling, denies dizziness.  She does have  complaints of nausea and vomiting.  Denies diarrhea.  The patient did have  recent exposure to sick child prior to onset of her symptoms.  She presented  to the emergency room with room air saturations of 84%.   PAST MEDICAL HISTORY:  1.  Positive most significantly for pneumonia in 2002, for which she      required prolonged mechanical ventilation and feels that she has never      fully regained her functional status since that time.  2.  Hypertension and medication noncompliance.  3.  Denies any history of cardiac disease or  diabetes.  4.  Does have a history of smoking.   FAMILY HISTORY:  Father died of emphysema.  Mother died of pneumonia.  Positive for hypertension and also for diabetes.   SOCIAL HISTORY:  The patient smokes 4-5 cigarettes a day since her last  hospitalization back in 2000.  Prior to that, smoked more than a pack a day  for over 20 years.  She does not work secondary to her baseline dyspnea on  exertion.  She lives in [place].  She has been engaged to her fiance for  over 15 years.  She does have a history of crack cocaine.  She says she  smoked crack last about 4 days prior to admission.  She has a positive  history of ETOH with about 1/2 pint per week but states that she does not  drink on a daily basis.  She has no other environmental exposure.  She heats  her home with electric heat.  As above noted, she has a history of  medication noncompliance.  The patient has  not taken her clonidine or  Norvasc in over a month as she states that she is unable to afford these  medications.   MEDICATIONS:  As previously noted, clonidine and Norvasc, again has not  taken these for over a month.   ALLERGIES:  No known drug allergies.   REVIEW OF SYSTEMS:  Please see previous History of Present Illness.   PHYSICAL EXAMINATION:  VITAL SIGNS:  Temperature 98.1, heart rate 84-100,  blood pressure in the 160s-180s systolic over 100-126 diastolic.  Respirations 28-30.  Oxygen saturations 84% on 3 L and, as previously noted,  84% on room air.  HEENT:  Mucous membranes are moist.  She does have some positive anterior  cervical lymphadenopathy.  PULMONARY:  She has crackles in bilateral bases, left greater than right.  She complains of left lower lobe posterior pleuritic chest pain.  She has  minimal increased work of breathing on 3 L nasal cannula, resting in the  upright position, but quickly becomes dyspneic with any exertion.  CARDIAC:  Regular rate and rhythm, 2+ pulses.  EXTREMITIES:  No  edema.  GASTROINTESTINAL:  Positive bowel sounds.  ABDOMEN:  Soft, nontender.  GENITOURINARY:  The patient is voiding.  NEUROLOGIC:  She moves all extremities with no focal deficits.  No obvious  cranial nerve deficits.   LABORATORY DATA:  White blood cells 4.4, hemoglobin 15.6, platelets 233.  Sodium 140, potassium 4.1, chloride 103, CO2 29, BUN 11, creatinine 0.8,  glucose 110.  Admitting 12-lead EKG demonstrating left axis deviation with  no ST changes.  Admitting BNP 564.  Chest x-ray demonstrating bilateral  lower lobe infiltrates, questionable small pulmonary effusions, right  greater than left.  Questionable underlying bronchitis or interstitial  pneumonitis.   IMPRESSION:  This is a frail 55 year old black female who presents today  with probable bilateral community-acquired pneumonia.  Her treatment to this  point in the emergency room has included IV Avelox, p.r.n. morphine for her  back pain, p.o. clonidine for hypertension, and albuterol inhaled nebulizer  for dyspnea.   IMPRESSION AND PLAN:  1.  Bilateral community-acquired pneumonia.  No organism specified with      hypoxic respiratory failure.  Plan for this is to change her antibiotics      to azithromycin and Rocephin, p.r.n. nebulized albuterol and pulmonary      toilette, send urinary strep, urinary Legionella antigens, as well as      influenza nasal swab.  Next, check an room air arterial blood gas and      also admit for IV antibiotics and treatment of pneumonia.  2.  Hypertensive urgency.  The patient currently with blood pressure in the      160s-180s/100s.  No end organ damage demonstrated by laboratory data.      However, has had a history of hypertension.   PLAN:  Gradually reduce blood pressure with p.o. clonidine scheduled twice a  day as well as p.r.n. hydralazine for systolic blood pressure greater than  170 or diastolic blood pressure greater than 95.  DISPOSITION:  She will be admitted to a  step-down unit primarily for  treatment for hypertensive urgency and anticipated need for IV  antihypertensives.      PB/MEDQ  D:  01/17/2005  T:  01/17/2005  Job:  [id number]

## 2011-05-01 ENCOUNTER — Inpatient Hospital Stay (HOSPITAL_COMMUNITY)
Admission: EM | Admit: 2011-05-01 | Discharge: 2011-05-04 | DRG: 065 | Disposition: A | Discharge status: Rehabilitation Facility | Payer: PRIVATE HEALTH INSURANCE | Source: Ambulatory Visit | Attending: Internal Medicine | Admitting: Internal Medicine

## 2011-05-01 ENCOUNTER — Emergency Department (HOSPITAL_COMMUNITY): Payer: PRIVATE HEALTH INSURANCE

## 2011-05-01 DIAGNOSIS — E785 Hyperlipidemia, unspecified: Secondary | ICD-10-CM | POA: Diagnosis present

## 2011-05-01 DIAGNOSIS — R1310 Dysphagia, unspecified: Secondary | ICD-10-CM | POA: Diagnosis present

## 2011-05-01 DIAGNOSIS — G8190 Hemiplegia, unspecified affecting unspecified side: Secondary | ICD-10-CM | POA: Diagnosis present

## 2011-05-01 DIAGNOSIS — I6350 Cerebral infarction due to unspecified occlusion or stenosis of unspecified cerebral artery: Principal | ICD-10-CM | POA: Diagnosis present

## 2011-05-01 DIAGNOSIS — F319 Bipolar disorder, unspecified: Secondary | ICD-10-CM | POA: Diagnosis present

## 2011-05-01 DIAGNOSIS — F17200 Nicotine dependence, unspecified, uncomplicated: Secondary | ICD-10-CM | POA: Diagnosis present

## 2011-05-01 LAB — TROPONIN I: Troponin I: <0.3 ng/mL (ref <0.30)

## 2011-05-01 LAB — BASIC METABOLIC PANEL
CO2: 26 mEq/L (ref 19–32)
Chloride: 101 mEq/L (ref 96–112)
Sodium: 138 mEq/L (ref 135–145)

## 2011-05-01 LAB — CBC
Hemoglobin: 14.5 g/dL (ref 12.0–15.0)
MCH: 26.3 pg (ref 26.0–34.0)
MCV: 79.9 fL (ref 78.0–100.0)
RBC: 5.51 MIL/uL — ABNORMAL HIGH (ref 3.87–5.11)

## 2011-05-01 LAB — COMPREHENSIVE METABOLIC PANEL
ALT: 9 U/L (ref 0–35)
Alkaline Phosphatase: 68 U/L (ref 39–117)
CO2: 27 mEq/L (ref 19–32)
GFR calc Af Amer: >60 mL/min (ref >60)
GFR calc non Af Amer: >60 mL/min (ref >60)
Glucose, Bld: 103 mg/dL — ABNORMAL HIGH (ref 70–99)
Potassium: 3.3 mEq/L — ABNORMAL LOW (ref 3.5–5.1)
Sodium: 142 mEq/L (ref 135–145)
Total Protein: 7.3 g/dL (ref 6.0–8.3)

## 2011-05-01 LAB — URINE MICROSCOPIC-ADD ON

## 2011-05-01 LAB — DIFFERENTIAL
Lymphs Abs: 1.2 10*3/uL (ref 0.7–4.0)
Monocytes Relative: 7 % (ref 3–12)
Neutro Abs: 6 10*3/uL (ref 1.7–7.7)
Neutrophils Relative %: 78 % — ABNORMAL HIGH (ref 43–77)

## 2011-05-01 LAB — URINALYSIS, ROUTINE W REFLEX MICROSCOPIC
Bilirubin Urine: NEGATIVE
Nitrite: NEGATIVE
Specific Gravity, Urine: 1.02 (ref 1.005–1.030)
pH: 6 (ref 5.0–8.0)

## 2011-05-01 LAB — ETHANOL: Alcohol, Ethyl (B): <11 mg/dL (ref 0–11)

## 2011-05-02 ENCOUNTER — Inpatient Hospital Stay (HOSPITAL_COMMUNITY): Payer: PRIVATE HEALTH INSURANCE

## 2011-05-02 LAB — RAPID URINE DRUG SCREEN, HOSP PERFORMED
Barbiturates: NOT DETECTED
Opiates: POSITIVE — AB
Tetrahydrocannabinol: NOT DETECTED

## 2011-05-02 LAB — MRSA PCR SCREENING: MRSA by PCR: NEGATIVE

## 2011-05-02 LAB — BASIC METABOLIC PANEL
CO2: 25 mEq/L (ref 19–32)
Chloride: 103 mEq/L (ref 96–112)
Glucose, Bld: 97 mg/dL (ref 70–99)
Potassium: 3.4 mEq/L — ABNORMAL LOW (ref 3.5–5.1)
Sodium: 139 mEq/L (ref 135–145)

## 2011-05-02 LAB — HEMOGLOBIN A1C
Hgb A1c MFr Bld: 6.3 % — ABNORMAL HIGH (ref <5.7)
Mean Plasma Glucose: 134 mg/dL — ABNORMAL HIGH (ref <117)

## 2011-05-02 LAB — LIPID PANEL: LDL Cholesterol: 63 mg/dL (ref 0–99)

## 2011-05-03 DIAGNOSIS — I6330 Cerebral infarction due to thrombosis of unspecified cerebral artery: Secondary | ICD-10-CM

## 2011-05-03 LAB — DIFFERENTIAL
Lymphocytes Relative: 19 % (ref 12–46)
Monocytes Absolute: 0.6 10*3/uL (ref 0.1–1.0)
Monocytes Relative: 8 % (ref 3–12)
Neutro Abs: 5 10*3/uL (ref 1.7–7.7)
Neutrophils Relative %: 72 % (ref 43–77)

## 2011-05-03 LAB — BASIC METABOLIC PANEL
BUN: 7 mg/dL (ref 6–23)
CO2: 26 mEq/L (ref 19–32)
Calcium: 8.9 mg/dL (ref 8.4–10.5)
GFR calc non Af Amer: >60 mL/min (ref >60)
Glucose, Bld: 93 mg/dL (ref 70–99)
Potassium: 3.2 mEq/L — ABNORMAL LOW (ref 3.5–5.1)
Sodium: 138 mEq/L (ref 135–145)

## 2011-05-03 LAB — CBC
HCT: 44.1 % (ref 36.0–46.0)
Hemoglobin: 14.8 g/dL (ref 12.0–15.0)
MCH: 26.8 pg (ref 26.0–34.0)
MCHC: 33.6 g/dL (ref 30.0–36.0)
RBC: 5.53 MIL/uL — ABNORMAL HIGH (ref 3.87–5.11)

## 2011-05-03 LAB — PHOSPHORUS: Phosphorus: 2.5 mg/dL (ref 2.3–4.6)

## 2011-05-04 ENCOUNTER — Inpatient Hospital Stay (HOSPITAL_COMMUNITY)
Admission: RE | Admit: 2011-05-04 | Discharge: 2011-05-15 | DRG: 945 | Disposition: A | Discharge status: Home Health | Payer: PRIVATE HEALTH INSURANCE | Source: Other Acute Inpatient Hospital | Attending: Physical Medicine & Rehabilitation | Admitting: Physical Medicine & Rehabilitation

## 2011-05-04 DIAGNOSIS — Z7902 Long term (current) use of antithrombotics/antiplatelets: Secondary | ICD-10-CM

## 2011-05-04 DIAGNOSIS — I6350 Cerebral infarction due to unspecified occlusion or stenosis of unspecified cerebral artery: Secondary | ICD-10-CM | POA: Diagnosis present

## 2011-05-04 DIAGNOSIS — F1010 Alcohol abuse, uncomplicated: Secondary | ICD-10-CM | POA: Diagnosis present

## 2011-05-04 DIAGNOSIS — Z5189 Encounter for other specified aftercare: Principal | ICD-10-CM

## 2011-05-04 DIAGNOSIS — G8190 Hemiplegia, unspecified affecting unspecified side: Secondary | ICD-10-CM | POA: Diagnosis present

## 2011-05-04 DIAGNOSIS — Z7982 Long term (current) use of aspirin: Secondary | ICD-10-CM

## 2011-05-04 DIAGNOSIS — R1310 Dysphagia, unspecified: Secondary | ICD-10-CM | POA: Diagnosis present

## 2011-05-04 DIAGNOSIS — N390 Urinary tract infection, site not specified: Secondary | ICD-10-CM | POA: Diagnosis not present

## 2011-05-04 DIAGNOSIS — E785 Hyperlipidemia, unspecified: Secondary | ICD-10-CM | POA: Diagnosis present

## 2011-05-04 DIAGNOSIS — F17200 Nicotine dependence, unspecified, uncomplicated: Secondary | ICD-10-CM | POA: Diagnosis present

## 2011-05-04 DIAGNOSIS — I6330 Cerebral infarction due to thrombosis of unspecified cerebral artery: Secondary | ICD-10-CM

## 2011-05-04 DIAGNOSIS — F319 Bipolar disorder, unspecified: Secondary | ICD-10-CM | POA: Diagnosis present

## 2011-05-04 DIAGNOSIS — F1910 Other psychoactive substance abuse, uncomplicated: Secondary | ICD-10-CM | POA: Diagnosis present

## 2011-05-04 DIAGNOSIS — I10 Essential (primary) hypertension: Secondary | ICD-10-CM | POA: Diagnosis present

## 2011-05-04 LAB — COMPREHENSIVE METABOLIC PANEL
ALT: 7 U/L (ref 0–35)
AST: 40 U/L — ABNORMAL HIGH (ref 0–37)
Albumin: 3.4 g/dL — ABNORMAL LOW (ref 3.5–5.2)
Alkaline Phosphatase: 63 U/L (ref 39–117)
BUN: 9 mg/dL (ref 6–23)
Chloride: 100 mEq/L (ref 96–112)
Potassium: 3.9 mEq/L (ref 3.5–5.1)
Sodium: 139 mEq/L (ref 135–145)
Total Protein: 6.9 g/dL (ref 6.0–8.3)

## 2011-05-04 LAB — CBC
HCT: 43.9 % (ref 36.0–46.0)
Hemoglobin: 14.7 g/dL (ref 12.0–15.0)
MCV: 80.1 fL (ref 78.0–100.0)
RDW: 13.7 % (ref 11.5–15.5)
WBC: 6.1 10*3/uL (ref 4.0–10.5)

## 2011-05-04 LAB — MAGNESIUM: Magnesium: 2.1 mg/dL (ref 1.5–2.5)

## 2011-05-05 DIAGNOSIS — I6330 Cerebral infarction due to thrombosis of unspecified cerebral artery: Secondary | ICD-10-CM

## 2011-05-05 LAB — URINALYSIS, MICROSCOPIC ONLY
Glucose, UA: NEGATIVE mg/dL
Leukocytes, UA: NEGATIVE
Protein, ur: 30 mg/dL — AB
Specific Gravity, Urine: 1.024 (ref 1.005–1.030)

## 2011-05-06 LAB — URINE CULTURE
Culture  Setup Time: 201206230051
Culture: NO GROWTH

## 2011-05-07 LAB — DIFFERENTIAL
Basophils Absolute: 0 10*3/uL (ref 0.0–0.1)
Eosinophils Absolute: 0 10*3/uL (ref 0.0–0.7)
Eosinophils Relative: 1 % (ref 0–5)

## 2011-05-07 LAB — CBC
Platelets: 126 10*3/uL — ABNORMAL LOW (ref 150–400)
RDW: 13.8 % (ref 11.5–15.5)
WBC: 4.2 10*3/uL (ref 4.0–10.5)

## 2011-05-07 LAB — COMPREHENSIVE METABOLIC PANEL
AST: 40 U/L — ABNORMAL HIGH (ref 0–37)
Albumin: 3.1 g/dL — ABNORMAL LOW (ref 3.5–5.2)
Alkaline Phosphatase: 59 U/L (ref 39–117)
Chloride: 100 mEq/L (ref 96–112)
Potassium: 4.1 mEq/L (ref 3.5–5.1)
Sodium: 139 mEq/L (ref 135–145)
Total Bilirubin: 0.2 mg/dL — ABNORMAL LOW (ref 0.3–1.2)

## 2011-05-08 DIAGNOSIS — I6330 Cerebral infarction due to thrombosis of unspecified cerebral artery: Secondary | ICD-10-CM

## 2011-05-08 DIAGNOSIS — F4323 Adjustment disorder with mixed anxiety and depressed mood: Secondary | ICD-10-CM

## 2011-05-08 DIAGNOSIS — F319 Bipolar disorder, unspecified: Secondary | ICD-10-CM

## 2011-05-10 DIAGNOSIS — I6330 Cerebral infarction due to thrombosis of unspecified cerebral artery: Secondary | ICD-10-CM

## 2011-05-13 DIAGNOSIS — I6330 Cerebral infarction due to thrombosis of unspecified cerebral artery: Secondary | ICD-10-CM

## 2011-05-13 LAB — URINALYSIS, ROUTINE W REFLEX MICROSCOPIC
Bilirubin Urine: NEGATIVE
Glucose, UA: NEGATIVE mg/dL
Ketones, ur: NEGATIVE mg/dL
pH: 8.5 — ABNORMAL HIGH (ref 5.0–8.0)

## 2011-05-13 LAB — URINE MICROSCOPIC-ADD ON

## 2011-05-14 DIAGNOSIS — G8110 Spastic hemiplegia affecting unspecified side: Secondary | ICD-10-CM

## 2011-05-14 DIAGNOSIS — I6330 Cerebral infarction due to thrombosis of unspecified cerebral artery: Secondary | ICD-10-CM

## 2011-05-14 LAB — URINALYSIS, ROUTINE W REFLEX MICROSCOPIC
Glucose, UA: NEGATIVE mg/dL
Ketones, ur: 15 mg/dL — AB
Specific Gravity, Urine: 1.031 — ABNORMAL HIGH (ref 1.005–1.030)
pH: 6.5 (ref 5.0–8.0)

## 2011-05-14 LAB — URINE MICROSCOPIC-ADD ON

## 2011-05-14 LAB — URINE CULTURE: Culture  Setup Time: 201207011803

## 2011-05-15 LAB — URINE CULTURE

## 2011-05-18 NOTE — Discharge Summary (Signed)
[name], [name] NO.:  [id number]  MEDICAL RECORD NO.:  [id number]  LOCATION:  4145                         FACILITY:  [facility]  PHYSICIAN:  Erick Colace, M.D.DATE OF BIRTH:  02/19/1956  DATE OF ADMISSION:  05/04/2011 DATE OF DISCHARGE:  05/15/2011                              DISCHARGE SUMMARY   DISCHARGE DIAGNOSES: 1. 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 2. Dysphagia. 3. Subcutaneous Lovenox for deep vein thrombosis prophylaxis. 4. Pain management. 5. Hypertension. 6. Urinary tract infection. 7. Tobacco, alcohol, and polysubstance abuse. 8. Hyperlipidemia. 9. History of gastrointestinal bleed.  HISTORY OF PRESENT ILLNESS:  This is a 55 year old African American female with history of hypertension, documented bipolar disorder, polysubstance abuse admitted on May 01, 2011 with altered mental status, dizziness, and slurred speech.  MRI showed large right middle cerebral artery and a parietal right PCA territory ischemic infarction. MRA with evidence of right middle cerebral artery M1 thrombus.  Carotid Dopplers with right thrombus versus soft plaque in the bulbs. Echocardiogram with ejection fraction of 65% with no wall motion abnormalities.  Neurology Services consulted, placed on aspirin and Plavix therapy, no current plan for Coumadin.  Urine drug screen was positive for cocaine.  Depakote had been added for headaches. Subcutaneous Lovenox added on May 03, 2011 for deep vein thrombosis prophylaxis.  Had bedside swallow evaluation and placed on a pureed with thin liquids.  Moderate assist to ambulate 10 feet handheld assistance. The patient was admitted for comprehensive rehab program.  PAST MEDICAL HISTORY:  See discharge diagnoses.  She does have a history of tobacco, alcohol use as well as history of polysubstance abuse.  ALLERGIES:  None.  SOCIAL HISTORY:  Lives alone.  She is on disability, 1-level home,  3 steps to entry, and sister to assist as needed.  FUNCTIONAL HISTORY PRIOR TO ADMISSION:  She was independent.  She does not drive.  FUNCTIONAL STATUS UPON ADMISSION TO REHAB SERVICES:  Moderate assist bed mobility, minimal assist transfers, moderate assist to ambulate 10 feet handheld assistance.  MEDICATIONS PRIOR TO ADMISSION: 1. Lyrica 75 mg twice daily. 2. Coreg 12.5 mg daily. 3. Clonidine 0.2 mg twice daily. 4. Norvasc 10 mg daily. 5. Atorvastatin 20 mg daily.  PHYSICAL EXAMINATION:  VITAL SIGNS:  Blood pressure 154/82, pulse 70, temperature 98, and respirations 18. GENERAL:  This was a lethargic female.  She would follow basic commands. She was dysarthric, withdraws to deep stimuli. LUNGS:  Clear to auscultation. CARDIAC:  Regular rate and rhythm. ABDOMEN:  Soft and nontender.  Good bowel sounds.  REHABILITATION HOSPITAL COURSE:  The patient was admitted to inpatient rehab services with therapies initiated on a 3-hour daily basis consisting of physical therapy, occupational therapy, speech therapy, and rehabilitation nursing.  The following issues were addressed during the patient's rehabilitation stay.  Pertaining to Ms. Messina's right middle cerebral artery infarction with left hemiplegia and dysarthria, she remained on aspirin and Plavix therapy.  Subcutaneous Lovenox is ongoing for deep vein thrombosis prophylaxis.  She was followed by Speech Therapy for dysphagia.  She was currently on a dysphagia I thin liquid diet.  She had episodes of headaches, initially placed on Depakote  500 mg 3 times daily with the addition of Topamax and monitored with good results.  She had a long documented history of tobacco, alcohol, and polysubstance abuse.  Her urine drug screen was positive for cocaine.  She was monitored for any withdrawal.  She received full counseling with the patient as well as family in regards to cessation of all products of alcohol, tobacco, and drug abuse.   It was questionable she will be compliant with these requests.  Her blood pressures were monitored with the addition of Coreg 12.5 mg twice daily which she was on prior to hospital admission.  She had no orthostatic changes.  She did have a positive urinalysis study of 7-10 wbc's.  Cipro was initiated on May 13, 2011, 500 mg twice daily and monitored.  The patient received weekly collaborative interdisciplinary team conferences to discuss estimated length of stay, family teaching, and any barriers to discharge.  She was minimal assist to ambulate as well as with transfers, minimum to moderate assist for activities of daily living. She needed ongoing supervision due to her decrease in attention and decreased insight to her overall deficits.  She did improve during her rehab stay, full family teaching was completed, and plans to be discharged to home.  DISCHARGE MEDICATIONS: 1. Aspirin 81 mg daily. 2. Coreg 12.5 mg twice daily. 3. Cipro 500 mg twice daily until May 19, 2011 and stop. 4. Plavix 75 mg daily. 5. Depakote 500 mg 3 times daily. 6. Folic acid 1 mg daily. 7. Advair 1 puff twice daily.8. Multivitamin daily. 9. NicoDerm patch taper as advised. 10.Protonix 40 mg daily. 11.Crestor 10 mg daily. 12.Topamax 25 mg twice daily. 13.Ultram 50 mg every 6 hours as needed for pain.  DIET:  Dysphagia I thin liquid diet with supervision for safety.  SPECIAL INSTRUCTIONS:  No alcohol, smoking, or illicit drugs.  The patient will be followed up by Dr. Claudette Laws at the [facility] as advised.  Home Health therapies have been arranged as per Rehab Services.  She should follow up with Dr. Donette Larry for medical management.     Mariam Dollar, P.A.   ______________________________ Erick Colace, M.D.    DA/MEDQ  D:  05/14/2011  T:  05/14/2011  Job:  [id number]  cc:   Georgann Housekeeper, MD Pramod P. Pearlean Brownie, MD  Electronically Signed by Mariam Dollar P.A. on  05/16/2011 06:47:36 AM Electronically Signed by Claudette Laws M.D. on 05/18/2011 10:24:36 AM

## 2011-05-23 NOTE — Consult Note (Signed)
NAMEMAGALLY, VAHLE NO.:  [id number]  MEDICAL RECORD NO.:  [id number]  LOCATION:                                 FACILITY:  PHYSICIAN:  Sheria Rosello P. Pearlean Brownie, MD    DATE OF BIRTH:  02/04/1956  DATE OF CONSULTATION: DATE OF DISCHARGE:                                CONSULTATION   REFERRING PHYSICIAN:  Celene Kras, MD  REASON FOR REFERRAL:  Stroke.  HISTORY OF PRESENT ILLNESS:  [name] is a 55 year old African American lady who apparently was first found not to be her usual self the last Saturday 4 days ago by her sister when they met at a Bible study group and she noticed that her speech was slurred and she was dizzy and off balance.  The patient was taken back home and was not heard for 2 days.  She did not return phone call.  Finally, the patient's daughter and sister went to her home today and knocked on the door and 20 minutes later the patient was finally able to make it to the door and open it and noticed that she had some slurred speech, left facial droop, and was dizzy and off balance.  They brought her to the hospital for evaluation where a CT scan of the head has been obtained which shows extensive low density in the right middle cerebral artery territory with dense MCA sign with 8-mm right-to-left midline shift consistent with a subacute infarct as well as additional low density in the right posterior cerebral artery distribution as well which may be compatible with a more recent acute infarct as well.  She has no known prior history of stroke, TIA, or seizures.  PAST MEDICAL HISTORY:  Significant for: 1. Cardiomyopathy. 2. Hypertension 3. Hyperlipidemia. 4. Alcoholism. 5. Smoking. 6. Amblyopia. 7. Bipolar disorder. 8. Congestive heart failure. 9. GI bleeding. 10.Pulmonary embolism. 11.Schizophrenia.  SOCIAL HISTORY:  The patient lives alone.  She is a heavy drinker.  She smokes and has also history of narcotic abuse.  FAMILY HISTORY:   Noncontributory.  REVIEW OF SYSTEMS:  As documented above in history of present illness. Negative for recent chest pain, cough, or diarrhea.  PHYSICAL EXAMINATION:  GENERAL:  A middle-aged [place] American lady who surprisingly does not appear to be in distress despite CT scan findings. VITAL SIGNS:  She is currently afebrile, temperature 98.9, blood pressure is 152/92, pulse rate is 77 per minute and regular sinus, respiratory rate is 18 per minute, and oxygen sats 99% on room air. HEAD:  Nontraumatic. NECK:  Supple without bruit. EARS:  Hearing appears normal. CARDIAC:  Regular heart sounds.  No gallop or murmur. LUNGS:  Clear to auscultation. ABDOMEN:  Soft and nontender. NEUROLOGIC:  The patient appears surprisingly awake and alert and is oriented to time, place, and person.  She has dysarthria, but can be understood.  There is no aphasia or apraxia.  There is no significant neglect.  She has right gaze deviation, but she can look up to midline to the left.  She has dense left homonymous hemianopsia.  There is mild left upper and lower extremity drift.  There is mild finger-to-nose dysmetria on  the left.  She has mild weakness of left grip and into the intrinsic hand muscles and orbits the right over left upper extremity. She has no objective sensory loss in the left, but does have sensory inattention to the left on double simultaneous testing.  NIH stroke scale, she scored 9.  DATA REVIEWED:  CT scan findings as stated above.  ADMISSION LABORATORY DATA:  WBC count is normal.  Electrolytes are normal.  IMPRESSION:  This is a 54-year lady with subacute right middle cerebral artery infarct with the right middle cerebral artery hyperdense sign and more acute right posterior cerebral artery infarct with significant cytotoxic edema and right to left midline shift.  PLAN:  The patient is critically ill, at significant risk for worsening cerebral edema and respiratory failure.   I had [name] discussion with the patient's sister who is present at the bedside regarding her condition, discussed prognosis, plan for further evaluation and treatment and answered questions.  She is critically ill.  Care requires medical decision making of high complexity with frequent neurological and hemodynamic monitoring and discussion with specialist and I spent 1 hour of critical care time.  She will be admitted to the Neurologic Intensive Care Unit.  We will keep her n.p.o.  We will limit of fluids.  Repeat CT scan of the head in the morning.  If she develops respiratory difficulty, she will require intubation and treatment with hypertonic saline.  Given history of cardiomyopathy, I have to hold off on starting hypertonic saline at the present time unless necessary.  Pursue further stroke workup with echocardiogram, Doppler studies, fasting lipid profile, hemoglobin A1c, and physical, occupational, speech therapy consults.  I will be happy to follow the patient in consult.  Kindly call for questions.     Curtis Cain P. Pearlean Brownie, MD     PPS/MEDQ  D:  05/01/2011  T:  05/02/2011  Job:  [id number]  Electronically Signed by Delia Heady MD on 05/23/2011 11:46:05 AM

## 2011-05-28 ENCOUNTER — Emergency Department (HOSPITAL_COMMUNITY)
Admission: EM | Admit: 2011-05-28 | Discharge: 2011-06-01 | Disposition: A | Discharge status: Home / Self Care | Payer: PRIVATE HEALTH INSURANCE | Attending: Emergency Medicine | Admitting: Emergency Medicine

## 2011-05-28 DIAGNOSIS — I10 Essential (primary) hypertension: Secondary | ICD-10-CM | POA: Insufficient documentation

## 2011-05-28 DIAGNOSIS — Z79899 Other long term (current) drug therapy: Secondary | ICD-10-CM | POA: Insufficient documentation

## 2011-05-28 DIAGNOSIS — F209 Schizophrenia, unspecified: Secondary | ICD-10-CM | POA: Insufficient documentation

## 2011-05-28 DIAGNOSIS — R4182 Altered mental status, unspecified: Secondary | ICD-10-CM | POA: Insufficient documentation

## 2011-05-28 DIAGNOSIS — Z8673 Personal history of transient ischemic attack (TIA), and cerebral infarction without residual deficits: Secondary | ICD-10-CM | POA: Insufficient documentation

## 2011-05-28 DIAGNOSIS — F329 Major depressive disorder, single episode, unspecified: Secondary | ICD-10-CM | POA: Insufficient documentation

## 2011-05-28 DIAGNOSIS — F3289 Other specified depressive episodes: Secondary | ICD-10-CM | POA: Insufficient documentation

## 2011-05-28 LAB — RAPID URINE DRUG SCREEN, HOSP PERFORMED
Amphetamines: NOT DETECTED
Barbiturates: NOT DETECTED
Benzodiazepines: NOT DETECTED
Cocaine: NOT DETECTED

## 2011-05-28 LAB — BASIC METABOLIC PANEL
BUN: 17 mg/dL (ref 6–23)
Chloride: 103 mEq/L (ref 96–112)
GFR calc Af Amer: >60 mL/min (ref >60)
Glucose, Bld: 103 mg/dL — ABNORMAL HIGH (ref 70–99)
Potassium: 3.7 mEq/L (ref 3.5–5.1)
Sodium: 139 mEq/L (ref 135–145)

## 2011-05-28 LAB — DIFFERENTIAL
Basophils Absolute: 0 10*3/uL (ref 0.0–0.1)
Lymphocytes Relative: 33 % (ref 12–46)
Lymphs Abs: 1.4 10*3/uL (ref 0.7–4.0)
Neutro Abs: 2.2 10*3/uL (ref 1.7–7.7)
Neutrophils Relative %: 51 % (ref 43–77)

## 2011-05-28 LAB — CBC
HCT: 36.5 % (ref 36.0–46.0)
MCV: 80.2 fL (ref 78.0–100.0)
RBC: 4.55 MIL/uL (ref 3.87–5.11)
WBC: 4.4 10*3/uL (ref 4.0–10.5)

## 2011-05-29 ENCOUNTER — Emergency Department (HOSPITAL_COMMUNITY): Payer: PRIVATE HEALTH INSURANCE

## 2011-05-29 LAB — VALPROIC ACID LEVEL: Valproic Acid Lvl: 58.6 ug/mL (ref 50.0–100.0)

## 2011-05-30 NOTE — Consult Note (Signed)
NAME:  DARLA, MCDONALD.:  [id number]  MEDICAL RECORD NO.:  [id number]  LOCATION:  WLED                         FACILITY:  [facility]  PHYSICIAN:  Marlan Palau, M.D.  DATE OF BIRTH:  10-23-1956  DATE OF CONSULTATION:  05/29/2011 DATE OF DISCHARGE:                                CONSULTATION   HISTORY OF PRESENT ILLNESS:  Audia Amick is a 55 year old right-handed black female born, [birth year], with a history of schizophrenia and bipolar disorder.  The patient sustained a right middle cerebral stroke that occurred in June 2012.  The patient required an acute admission with subsequent transfer to rehab.  The patient was discharged on May 14, 2011.  The patient had right internal carotid artery thrombus noted and the 2-D echocardiogram showed an ejection fraction 60% without evidence of source of embolus.  Again, urine drug screen is positive for cocaine.  The patient has been living with her sister, but has had a recent increase in agitated behavior.  The patient has had an increase in anxiety episodes and has been having a tendency to wander.  The patient has been imaginary to a visa card and wants to go to [employer] to spend money.  The patient is breaking things about the house.  The patient was seen by Dr. Betti Cruz today and was felt to require an inpatient hospitalization.  The patient was sent through the [facility] Emergency Room for a neurologic clearance.  PAST MEDICAL HISTORY: 1. History of schizophrenia with bipolar disorder. 2. Hypertension. 3. Right middle cerebral artery stroke.  The right internal carotid     artery thrombus. 4. Cocaine abuse. 5. Fibromyalgia. 6. History of GI bleed with peptic ulcer disease. 7. Dyslipidemia. 8. Congestive heart failure. 9. Pulmonary emboli. 10.History of alcohol abuse.  ALLERGIES:  The patient has no known allergies.  SOCIAL HISTORY:  Smokes 3 cigarettes daily.  Does have a history of alcohol and  cocaine abuse.  This patient is single, lives in the [place] area with her sister.  The patient is on disability.  MEDICATIONS:  At this time include, 1. Aspirin 81 mg daily. 2. Plavix 75 mg daily. 3. Depakote 500 mg twice daily. 4. Lorazepam 1 mg every 6 hours if needed. 5. Tramadol 100 mg every 4 hours if needed. 6. Amlodipine 10 mg daily. 7. Atorvastatin 20 mg daily. 8. Lyrica 75 mg twice daily. 9. The patient is on Risperdal as well.  FAMILY MEDICAL HISTORY:  Notable for diabetes running in the family. The patient claims that both her parents died with liver disease and cirrhosis of the liver.  The patient claims she has 2 sisters who are alive and well, has no brothers.  REVIEW OF SYSTEMS:  Notable for no recent fever, chills.  The patient denies headache, claims to be cold frequently.  Denies shortness of breath, chest pains, abdominal pain, nausea, vomiting, or problems controlling the bowels or bladder.  The patient has not had any blackout episodes of seizures.  The patient reports no changes in her left-sided weakness, has vision changes to the left.  PHYSICAL EXAMINATION:  VITAL SIGNS:  Blood pressure currently is 163/98, heart rate 76,  respiratory rate 16, temperature afebrile. GENERAL:  This patient is a thin, black female who appears to be sleepy at the time of examination. HEENT:  Head is atraumatic.  Eyes, pupils are equal, round, and reactive to light. NECK:  Supple.  No carotid bruits noted. RESPIRATORY:  Clear. CARDIOVASCULAR:  Regular rate and rhythm.  No obvious murmurs or rubs noted. EXTREMITIES:  Without significant edema. NEUROLOGIC:  Cranial nerves as above.  The patient has depression of the left nasolabial fold, asymmetric smile.  Extraocular movements are full. The patient has a dense left homonymous visual field deficit.  Pinprick sensation of face is symmetric.  The patient has good strength on the right arm, right leg has a mild left  hemiparesis.  The patient has fair finger-nose-finger and heel-to-shin bilaterally.  Gait is slightly wide- based, slightly staggered, tandem and gait is unsteady.  Romberg negative.  No drift is seen.  Deep tendon reflexes are symmetric.  Toes neutral bilaterally.  Laboratory values notable for white count of 4.4, hemoglobin 11.9, hematocrit 36.5, MCV of 80.2, platelets of 142.  Sodium 139, potassium 3.7, chloride of 103, CO2 of 29, glucose 103, BUN 17, creatinine 1.0, calcium 8.8.  Urine drug screen is negative.  IMPRESSION: 1. History of schizophrenia. 2. Recent right middle cerebral stroke.  This patient seemed to have an exacerbation of her behavior.  The patient has had agitated episodes previously, but sister claims that this is worse than usual.  Right brain strokes may result in increased depression.  There appears be no evidence that there has been any change in her neurologic status, however.  We will check a CT scan of the head tonight to look for any acute changes; if this is unremarkable, the patient is cleared for psychiatric admission.  May consider an EEG study at some point during that admission.  We will follow this patient's clinical course if needed.     Marlan Palau, M.D.     CKW/MEDQ  D:  05/29/2011  T:  05/30/2011  Job:  [id number]  cc:   Dr. [facility] [address], [address] [place], [place] [address]  Electronically Signed by Thana Farr M.D. on 05/30/2011 08:24:36 AM

## 2011-06-06 ENCOUNTER — Emergency Department (HOSPITAL_COMMUNITY)
Admission: EM | Admit: 2011-06-06 | Discharge: 2011-06-06 | Disposition: A | Discharge status: Home / Self Care | Payer: PRIVATE HEALTH INSURANCE | Attending: Emergency Medicine | Admitting: Emergency Medicine

## 2011-06-06 DIAGNOSIS — E785 Hyperlipidemia, unspecified: Secondary | ICD-10-CM | POA: Insufficient documentation

## 2011-06-06 DIAGNOSIS — Z8673 Personal history of transient ischemic attack (TIA), and cerebral infarction without residual deficits: Secondary | ICD-10-CM | POA: Insufficient documentation

## 2011-06-06 DIAGNOSIS — I10 Essential (primary) hypertension: Secondary | ICD-10-CM | POA: Insufficient documentation

## 2011-06-06 DIAGNOSIS — Z79899 Other long term (current) drug therapy: Secondary | ICD-10-CM | POA: Insufficient documentation

## 2011-06-06 DIAGNOSIS — F319 Bipolar disorder, unspecified: Secondary | ICD-10-CM | POA: Insufficient documentation

## 2011-06-06 DIAGNOSIS — I959 Hypotension, unspecified: Secondary | ICD-10-CM | POA: Insufficient documentation

## 2011-06-06 DIAGNOSIS — I509 Heart failure, unspecified: Secondary | ICD-10-CM | POA: Insufficient documentation

## 2011-06-06 DIAGNOSIS — Z7982 Long term (current) use of aspirin: Secondary | ICD-10-CM | POA: Insufficient documentation

## 2011-06-06 DIAGNOSIS — Z86711 Personal history of pulmonary embolism: Secondary | ICD-10-CM | POA: Insufficient documentation

## 2011-06-06 DIAGNOSIS — IMO0002 Reserved for concepts with insufficient information to code with codable children: Secondary | ICD-10-CM | POA: Insufficient documentation

## 2011-06-06 DIAGNOSIS — IMO0001 Reserved for inherently not codable concepts without codable children: Secondary | ICD-10-CM | POA: Insufficient documentation

## 2011-06-06 LAB — DIFFERENTIAL
Basophils Absolute: 0 10*3/uL (ref 0.0–0.1)
Eosinophils Relative: 2 % (ref 0–5)
Lymphocytes Relative: 29 % (ref 12–46)
Monocytes Absolute: 0.9 10*3/uL (ref 0.1–1.0)
Monocytes Relative: 21 % — ABNORMAL HIGH (ref 3–12)
Neutro Abs: 2 10*3/uL (ref 1.7–7.7)

## 2011-06-06 LAB — COMPREHENSIVE METABOLIC PANEL
AST: 35 U/L (ref 0–37)
BUN: 19 mg/dL (ref 6–23)
CO2: 30 mEq/L (ref 19–32)
Calcium: 9.5 mg/dL (ref 8.4–10.5)
Chloride: 103 mEq/L (ref 96–112)
Creatinine, Ser: 0.93 mg/dL (ref 0.50–1.10)
GFR calc Af Amer: >60 mL/min (ref >60)
GFR calc non Af Amer: >60 mL/min (ref >60)
Glucose, Bld: 102 mg/dL — ABNORMAL HIGH (ref 70–99)
Total Bilirubin: 0.1 mg/dL — ABNORMAL LOW (ref 0.3–1.2)

## 2011-06-06 LAB — CBC
HCT: 36.8 % (ref 36.0–46.0)
Hemoglobin: 12.2 g/dL (ref 12.0–15.0)
MCH: 26.5 pg (ref 26.0–34.0)
MCHC: 33.2 g/dL (ref 30.0–36.0)
RDW: 14.8 % (ref 11.5–15.5)

## 2011-06-06 LAB — PROTIME-INR: INR: 1.04 (ref 0.00–1.49)

## 2011-06-06 NOTE — H&P (Signed)
Tracy, Simmons NO.:  [id number]  MEDICAL RECORD NO.:  [id number]  LOCATION:  4145                         FACILITY:  [facility]  PHYSICIAN:  Ranelle Oyster, M.D.DATE OF BIRTH:  1956/04/16  DATE OF ADMISSION:  05/04/2011 DATE OF DISCHARGE:                             HISTORY & PHYSICAL   PRIMARY CARE PHYSICIAN:  Georgann Housekeeper, MD  CHIEF COMPLAINTS:  Left-sided weakness  HISTORY OF PRESENT ILLNESS:  This is a 55 year old African American female with bipolar disorder and polysubstance abuse who was admitted on June 19 with altered mental status, dizziness and slurred speech.  MRI showed a large right MCA and right PCA territory infarcts.  MRA showed evidence of right MCA M1 thrombus.  Carotid Dopplers were notable for thrombus within the right ICA with soft plaque in the bulbs. Echocardiogram is pending.  Neurology recommend aspirin and Plavix for stroke prophylaxis.  No plans for Coumadin therapy due to the size of an infarct.  The UDS was positive for cocaine.  Depakote was added for headaches.  The patient has had issues with lethargy.  Some question of whether some of this is behavioral as well and some drug-seeking.  Rehab was consulted and see this patient.  I felt she can benefit from an inpatient stay.  REVIEW OF SYSTEMS:  Notable for the above.  Full 12-oint review is in the written H and P.  PAST MEDICAL HISTORY:  Positive for: 1. Hypertension. 2. Bipolar/schizophrenia. 3. Fibromyalgia. 4. GI bleed. 5. Hyperlipidemia. 6. CHF. 7. More pulmonary emboli. 8. History of tobacco, alcohol and polysubstance abuse including     cocaine.  FAMILY HISTORY:  Positive for diabetes.  SOCIAL HISTORY:  The patient lives alone and is on disability.  She lives in 1-level house, 3 steps to enter.  Sister will assist at discharge as needed.  ALLERGIES:  None.  HOME MEDICATIONS:  Lyrica, Coreg, clonidine, Norvasc, and atorvastatin.  LABORATORY DATA:   Hemoglobin 14.8, platelets 148, white count 7.  Sodium 138, potassium 3.2, BUN 7, creatinine 0.5.  PHYSICAL EXAMINATION:  VITAL SIGNS:  Blood pressure is 154/82, pulse is 70, respiratory rate 18, temperature 98. GENERAL:  The patient is lethargic.  She does open her eyes slightly to command and can follow simple directions today, but quickly falls asleep.  She has a left inattention.  Speech is dysarthric. EAR, NOSE AND THROAT:  Notable for borderline dentition and dry mucosa. NECK:  Supple without JVD or lymphadenopathy. CHEST:  Clear to auscultation bilaterally without wheezes, rales, or rhonchi. HEART:  Regular rate and rhythm without murmurs, rubs, or gallops. ABDOMEN: Soft, nontender.  Bowel sounds are positive. SKIN:  Generally intact without signs of breakdown. NEUROLOGICALLY:  Cranial nerves II through XII are notable for left central VII and tongue deviation.  It is hard to test visual field due to her arousal level.  Reflexes are 1+.  She does have some mild withdraw to pain in the left side.  I do not believe her sensation is normal, however.  Judgment, orientation, and memory are all poor and diminished due to her arousal state.  Strength is 0/5, left upper extremity.  She is 1-2/5, left  lower extremity where she has been stronger yesterday when I examined her because she was more awake.  POST ADMISSION PHYSICIAN EVALUATION: 1. Functional deficit secondary to large right MCA infarct with dense     left hemiparesis, inattention lethargy.  The patient has a history     of polysubstance abuse as well. 2. The patient is admitted to receive collaborative interdisciplinary     care between the physiatrist, rehab nursing staff, and therapy     team. 3. The patient's level of medical complexity and substantial therapy     needs in context of that medical necessity cannot be provided at a     lesser intensity of care.  The patient's medical complexity     includes her  polysubstance abuse, pain management, blood pressure     control, hyperlipidemia, and history of GI bleed. 4. The patient has experienced substantial functional loss from her     baseline.  Upon functional assessment at the time of this     admission, the patient was mod assist bed mobility, min assist     transfer, gait 10 feet and will assist with cues and OT had not     been working with the patient as of yet.  Premorbidly, she was     independent.  Judging by the patient's diagnosis, physical exam and     functional history, she has a potential for functional progress,     which will result in measurable gains while inpatient rehab.  This     gains will be of substantial and practical use upon discharge to     home in facilitating mobility and self-care.  Interim changes since     our consult are detailed above. 5. The physiatrist will provide 24-hour management of medical needs as     well as oversight of the therapy plan/treatment and provide     guidance as appropriate regarding interaction of the two.  Medical     problem list and plan are below. 6. A 24-hour rehab nursing team will assist in management of the    patient's skin care needs as well as pain, nutrition, safety     awareness and the bowel and bladder function with integration of     therapy concepts and techniques also important. 7. PT will assess and treat for lower extremity strength, range of     motion, neuromuscular education, adaptive techniques, equipment,     functional mobility, inpatient family education with goals,     supervision to min assist. 8. OT will assess and treat for upper extremity use, ADLs, adaptive     techniques, equipment, safety, functional mobility, upper extremity     strength, cognitive and visual perceptual orientation with goals     supervision to min assist. 9. Speech Language Pathology will follow for swallowing, cognition,     speech clarity and communication with goals ranging from  modified     independent to supervision. 10.Case management and social worker will assess treat for     psychosocial issues and discharge planning. 11.Team conference be held weekly to assess progress towards goals and     to determine barriers at discharge. 12.The patient has demonstrated sufficient medical stability and     exercise capacity to tolerate at least 3 hours of therapy per day     at least 5 days per week. 13.Estimated length of stay is approximately 2-3 weeks.  Prognosis is     good.  MEDICAL PROBLEM LIST  AND PLAN: 1. Anticoagulation with subcu Lovenox for deep venous thrombosis     prophylaxis and aspirin and Plavix for stroke prophylaxis. 2. Pain management with p.r.n. Ultram, scheduled Depakote and Lyrica.     Need to be careful considering her neuro sedation.  We will check     Depakote level in the morning.  We will minimize use of tramadol     given her lethargy today. 3. Blood pressure control:  No meds at present.  Blood pressure is     normal to borderline.  We will follow with increased activity and     arousal. 4. Dysphagia:  Pureed and liquid diet on board.  Need to be very     careful given her arousal state today.  Sister is aware as our     nursing. 5. History of polysubstance abuse:  The patient will receive     counseling through the staff team here and will need outpatient     counseling as well. 6. Hyperlipidemia:  Crestor. 7. History of gastrointestinal bleed:  Add Protonix given the     medications being used above.     Ranelle Oyster, M.D.     ZTS/MEDQ  D:  05/04/2011  T:  05/05/2011  Job:  [id number]  cc:   Georgann Housekeeper, MD  Electronically Signed by Faith Rogue M.D. on 06/06/2011 12:46:55 PM

## 2011-06-12 ENCOUNTER — Encounter: Payer: PRIVATE HEALTH INSURANCE | Attending: Physical Medicine & Rehabilitation

## 2011-06-12 ENCOUNTER — Inpatient Hospital Stay: Payer: PRIVATE HEALTH INSURANCE | Admitting: Physical Medicine & Rehabilitation

## 2011-06-13 NOTE — Consult Note (Signed)
NAMEDILAN, FULLENWIDER NO.:  [id number]  MEDICAL RECORD NO.:  [id number]  LOCATION:  [facility]                         FACILITY:  [facility]  PHYSICIAN:  Ramiro Harvest, MD    DATE OF BIRTH:  11/28/1955  DATE OF CONSULTATION: DATE OF DISCHARGE:                                CONSULTATION   PRIMARY CARE PHYSICIAN:  Georgann Housekeeper, MD  REASON FOR CONSULTATION:  Medical clearance.  PHYSICIAN REQUESTING CONSULT:  Orlene Och, MD, ED physician.  HISTORY OF PRESENT ILLNESS:  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  The patient initially was admitted in the inpatient unit at [facility] and after sustaining acute right middle cerebral artery stroke in June 2012, the patient was seen by neurology during that hospitalization and the patient was noted at that time to have a right internal carotid artery thrombus which was noted and a 2-D echo which had a EF of 60% without any evidence of source of embolus. UDS was positive for cocaine and the patient was subsequently placed on aspirin and Plavix at that time.  The patient was seen by PT/OT as well as speech therapy and subsequently transferred to the inpatient rehab service on May 04, 2011.  The patient was subsequently discharged from the inpatient rehab service on May 15, 2011 and discharged home per ED records.  The patient had been living with her sister but had had an increase in agitation with increased anxiety with a tendency to wonder as well as breaking things.  The patient was subsequently transferred to [facility] where it was felt the  patient may need inpatient hospitalization.  She had been in the ED since May 28, 2011.  She had been seen by telepsychiatry who had recommended that the patient be admitted to an inpatient psychiatric unit.  At that time, a neurology consult was also requested for clearance.  The patient was seen in consultation by Dr. Lesia Sago on May 29, 2011 and was felt that the patient did have an exacerbation of her agitation and behavior and it was felt that her neurological status was unchanged.  A CT of the head was also obtained which did show large subacute right hemispheric infarct.  The patient was cleared per neurology for psychiatric admission and the patient had been sitting in the ED.  We were subsequently called on June 01, 2011 for medical clearance.  The patient was seen.  The patient denied any fever.  No chills.  No nausea or vomiting.  No diarrhea.  No cough.  No abdominal pain or chest pain.  No shortness of breath.  No weakness.  No dysuria.  No other associated symptoms.  It was also noted per E-chart records that the patient had also been recently treated for a recent UTI after discharge from the inpatient rehab service.  The patient is currently in  stable condition.  ALLERGIES:  No known drug allergies.  PAST MEDICAL HISTORY: 1. History of schizophrenia. 2. History of bipolar disorder. 3. Hypertension. 4. 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 5. History of polysubstance abuse. 6. Fibromyalgia. 7. History of peptic ulcer disease. 8. Dyslipidemia. 9. History of pulmonary emboli. 10.History of alcohol abuse. 11.History of gastric ulcer in 2009. 12.Status post expiratory laparotomy for bleeding peptic ulcer disease     in 2009. 13.History of orthostatic hypotension in the past. 14.History of hypokalemia. 15.History of congestive heart failure. 16.Status post gastrostomy. 17.History of  duodenitis.  CURRENT MEDICATIONS: 1. Aspirin 81 mg p.o. daily. 2. Coreg 12.5 mg p.o. b.i.d. 3. Plavix 75 mg p.o. daily. 4. Depakote 500 mg p.o. t.i.d. 5. Advair 1 puff twice daily. 6. Multivitamin 1 tablet p.o. daily. 7. Protonix 40 mg p.o. daily. 8. Risperdal 1 mg p.o. b.i.d. 9. Topamax 25 mg p.o. b.i.d.  SOCIAL HISTORY:  Positive for tobacco use.  Former alcohol use.  Former polysubstance abuse per patient.  FAMILY HISTORY:  The patient states that mother deceased at age 47 from cirrhosis.  The patient also states father deceased in his 29s from cirrhosis as well.  REVIEW OF SYSTEMS:  As per HPI, otherwise negative.  PHYSICAL EXAMINATION:  VITAL SIGNS:  Temperature 98.8, blood pressure 137/91, pulse of 72, respirations 18. GENERAL:  The patient is well-developed, well-nourished lady, lying on gurney, no apparent cardiopulmonary distress. HEENT:  Normocephalic, atraumatic.  Pupils equal, round and react to light and accommodation.  Extraocular movements intact.  Oropharynx is clear.  No lesions.  No exudates. NECK:  Supple.  No lymphadenopathy. RESPIRATORY:  Lungs are clear to auscultation bilaterally.  No wheezes, no crackles, no rhonchi. CARDIOVASCULAR:  Regular rate and rhythm.  No murmurs, rubs or gallops. ABDOMEN:  Soft, nontender, nondistended.  Positive bowel sounds. EXTREMITIES:  No clubbing, cyanosis or edema. NEUROLOGIC:  The patient is alert and oriented x3.  Cranial nerves II through XII are grossly intact.  Left nasal labial fold depression, 5 out of 5 bilateral upper extremity strength.  5 out of 5 right lower extremity strength, 4 out of 5 left lower extremity strength.  Gait not tested.  Sensation is intact.  Left visual field defect.  LABORATORY DATA:  Depakote level done on May 29, 2011 was 58.6.  UDS done on May 28, 2011 was negative.  BMET done on May 28, 2011 showed a sodium of 139, potassium 3.7, chloride 103, bicarb 29, glucose 103, BUN 17,  creatinine 1.00, calcium of 8.8.  Alcohol level done on May 28, 2011 was less than 11.  CBC done on May 28, 2011, white count of 4.4, hemoglobin 11.9, hematocrit 36.5, platelet count of 142,000 with ANC of 2.2.  CT of the head done May 29, 2011 showed large subacute right hemispheric infarct, lamina necrosis along the periphery suspected, although petechial hemorrhage not excluded.  ASSESSMENT: 1. Schizophrenia/psychosis. 2. Bipolar disorder. 3. History of recent right middle cerebral artery stroke. 4. Polysubstance abuse. 5. 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 6. History of alcohol abuse. 7. Dysarthria. 8. Hypertension. 9. Recently treated urinary tract infection. 10.History of gastrointestinal bleed.  PLAN:  The patient is currently in stable condition.  Medically, the patient has no active medical issues at this time.  The patient's lab work obtained as stated above is essentially unremarkable.  The patient has been  seen by neurology on May 29, 2011 and had been cleared from a neuro standpoint for inpatient psych admission.  The patient is also medically cleared from our standpoint for inpatient psych admission as recommended per psychiatry.  It has been a pleasure being involved in the care of [name].  The patient is medically stable and cleared for inpatient psych admission.     Ramiro Harvest, MD     DT/MEDQ  D:  06/01/2011  T:  06/01/2011  Job:  [id number]  cc:   Georgann Housekeeper, MD Fax: [phone]  Orlene Och, MD  Electronically Signed by Ramiro Harvest MD on 06/13/2011 08:12:58 PM

## 2011-06-14 NOTE — Discharge Summary (Addendum)
  NAMEANASTASIJA, [name] NO.:  [id number]  MEDICAL RECORD NO.:  [id number]  LOCATION:  [facility]                         FACILITY:  [facility]  PHYSICIAN:  Zannie Cove, MD     DATE OF BIRTH:  1956-11-03  DATE OF ADMISSION:  05/28/2011 DATE OF DISCHARGE:                              DISCHARGE SUMMARY   Discharged to [facility].  DISCHARGE DIAGNOSES: 1. Large right middle cerebral artery and right posterior cerebral     artery infarct with left hemiparesis. 2. Bipolar disorder. 3. Dyslipidemia. 4. Polysubstance abuse. 5. 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 6. Alcohol abuse. 7. Dysphasia.  DISCHARGE MEDICATIONS: 1. Tylenol 60 mg q.4 hours p.r.n. 2. Aspirin 81 mg daily. 3. Plavix 75 mg daily. 4. Depakote 500 mg p.o. b.i.d. 5. Ensure 237 mL p.o. at bedtime. 6. Lorazepam 1 mg p.o. q.6 hours p.r.n. 7. Nicotine patch daily. 8. Tramadol 100 mg q.4 hours p.r.n. 9. Amlodipine 10 mg daily. 10.Atorvastatin 20 mg daily. 11.Lyrica 75 mg b.i.d.  DIAGNOSTICS/INVESTIGATIONS:  CT of the head on May 01, 2011, showed large right MCA infarct with cytotoxic edema, large right PCA infarct, mass effect with complete effacement of the right lateral ventricle, and 8-mm leftward midline shift.  MRI of the brain on May 02, 2011, showed large MCA and partial right PCA ischemic infarct with significant cytotoxic edema, no hemorrhagic conversion.  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  HOSPITAL COURSE:  [name] is a 55 year old female who presented to the hospital with significant left-sided weakness. found to have large right MCA and PCA infarct, also positive for cocaine in her  system. Subsequently, had carotid Dopplers done which showed possible right thrombus versus soft plaque in the right carotid artery and no ICA stenosis on the left.  Subsequently, seen by Neurology in consultation, started the patient on aspirin and Plavix, did not feel that she would be a good Coumadin candidate due to the size of the stroke being extremely large and hence high risk of hemorrhagic transformation.  She was also counseled regarding cocaine polysubstance abuse and further plans were discussed with Dr. Pearlean Brownie with Neurology recommended follow up Dopplers in 2 months' time which could be done in his office and consideration could be given to Coumadin at that time as well.  At this time, the patient is being discharged to inpatient rehab and to be possibly considered in the future and followup carotid Dopplers as well.  Discharge followup with Dr. Delia Heady, [facility], in 2 months' time.     Zannie Cove, MD     PJ/MEDQ  D:  05/29/2011  T:  05/29/2011  Job:  [id number]  Electronically Signed by Zannie Cove  on 07/03/2011 06:56:30 PM

## 2011-08-13 ENCOUNTER — Ambulatory Visit: Payer: PRIVATE HEALTH INSURANCE | Attending: Internal Medicine | Admitting: Physical Therapy

## 2011-08-13 DIAGNOSIS — I69919 Unspecified symptoms and signs involving cognitive functions following unspecified cerebrovascular disease: Secondary | ICD-10-CM | POA: Insufficient documentation

## 2011-08-13 DIAGNOSIS — R269 Unspecified abnormalities of gait and mobility: Secondary | ICD-10-CM | POA: Insufficient documentation

## 2011-08-13 DIAGNOSIS — Z5189 Encounter for other specified aftercare: Secondary | ICD-10-CM | POA: Insufficient documentation

## 2011-08-13 DIAGNOSIS — I69998 Other sequelae following unspecified cerebrovascular disease: Secondary | ICD-10-CM | POA: Insufficient documentation

## 2011-08-13 DIAGNOSIS — M6281 Muscle weakness (generalized): Secondary | ICD-10-CM | POA: Insufficient documentation

## 2011-08-13 LAB — URINALYSIS, ROUTINE W REFLEX MICROSCOPIC
Bilirubin Urine: NEGATIVE
Bilirubin Urine: NEGATIVE
Glucose, UA: NEGATIVE
Glucose, UA: NEGATIVE
Hgb urine dipstick: NEGATIVE
Hgb urine dipstick: NEGATIVE
Nitrite: NEGATIVE
Specific Gravity, Urine: 1.015
Specific Gravity, Urine: 1.029
Urobilinogen, UA: 0.2
pH: 7
pH: 6.5

## 2011-08-13 LAB — CBC
HCT: 28.6 — ABNORMAL LOW
HCT: 39.2
Hemoglobin: 10.9 — ABNORMAL LOW
Hemoglobin: 13.2
Hemoglobin: 9.1 — ABNORMAL LOW
Hemoglobin: 9.5 — ABNORMAL LOW
MCHC: 32.8
MCHC: 32.9
MCHC: 33
MCHC: 33.2
MCV: 82.3
MCV: 83.3
Platelets: 189
Platelets: 193
Platelets: 223
RBC: 3.3 — ABNORMAL LOW
RBC: 3.48 — ABNORMAL LOW
RBC: 3.48 — ABNORMAL LOW
RBC: 4.01
RBC: 4.69
RDW: 16.1 — ABNORMAL HIGH
RDW: 16.3 — ABNORMAL HIGH
RDW: 16.5 — ABNORMAL HIGH
WBC: 4.3
WBC: 8.3

## 2011-08-13 LAB — BASIC METABOLIC PANEL
BUN: 2 — ABNORMAL LOW
CO2: 25
CO2: 27
CO2: 29
CO2: 30
Calcium: 7.6 — ABNORMAL LOW
Calcium: 8.1 — ABNORMAL LOW
Calcium: 8.3 — ABNORMAL LOW
Chloride: 110
Creatinine, Ser: 0.59
Creatinine, Ser: 0.65
GFR calc Af Amer: >60
GFR calc Af Amer: >60
GFR calc Af Amer: >60
GFR calc Af Amer: >60
GFR calc non Af Amer: >60
GFR calc non Af Amer: >60
GFR calc non Af Amer: >60
Glucose, Bld: 100 — ABNORMAL HIGH
Glucose, Bld: 119 — ABNORMAL HIGH
Potassium: 3 — ABNORMAL LOW
Potassium: 4
Sodium: 137
Sodium: 139
Sodium: 142
Sodium: 144

## 2011-08-13 LAB — CROSSMATCH
Antibody Screen: POSITIVE
DAT, IgG: NEGATIVE
DAT, complement: NEGATIVE

## 2011-08-13 LAB — TYPE AND SCREEN
Antibody Screen: NEGATIVE
DAT, IgG: NEGATIVE

## 2011-08-13 LAB — COMPREHENSIVE METABOLIC PANEL
ALT: 11
ALT: 9
ALT: 9
AST: 16
AST: 44 — ABNORMAL HIGH
Albumin: 2.3 — ABNORMAL LOW
Albumin: 2.4 — ABNORMAL LOW
Alkaline Phosphatase: 38 — ABNORMAL LOW
Alkaline Phosphatase: 41
CO2: 29
Calcium: 7.3 — ABNORMAL LOW
Calcium: 7.5 — ABNORMAL LOW
Calcium: 8.2 — ABNORMAL LOW
Chloride: 105
Chloride: 108
Creatinine, Ser: 0.96
GFR calc Af Amer: >60
GFR calc Af Amer: >60
GFR calc non Af Amer: >60
Glucose, Bld: 94
Potassium: 3.9
Potassium: 4.6
Sodium: 140
Sodium: 142
Sodium: 142
Total Bilirubin: 0.4
Total Protein: 4.6 — ABNORMAL LOW

## 2011-08-13 LAB — URINE CULTURE
Colony Count: NO GROWTH
Culture: NO GROWTH
Special Requests: NEGATIVE

## 2011-08-13 LAB — DIFFERENTIAL
Basophils Absolute: 0
Basophils Relative: 0
Eosinophils Absolute: 0
Eosinophils Absolute: 0
Eosinophils Relative: 0
Eosinophils Relative: 0
Eosinophils Relative: 0
Lymphocytes Relative: 13
Lymphocytes Relative: 6 — ABNORMAL LOW
Lymphs Abs: 0.6 — ABNORMAL LOW
Lymphs Abs: 0.7
Lymphs Abs: 1.1
Monocytes Absolute: 0.5
Monocytes Absolute: 0.9
Monocytes Relative: 8
Neutrophils Relative %: 74

## 2011-08-13 LAB — URINE MICROSCOPIC-ADD ON

## 2011-08-13 LAB — ABO/RH: ABO/RH(D): B POS

## 2011-08-13 LAB — LIPASE, BLOOD: Lipase: 21

## 2011-08-13 LAB — PHOSPHORUS
Phosphorus: 3.9
Phosphorus: 4.4

## 2011-08-13 LAB — HEMOGLOBIN AND HEMATOCRIT, BLOOD
HCT: 38.6
HCT: 44.4
Hemoglobin: 12
Hemoglobin: 13.2
Hemoglobin: 14.4

## 2011-08-13 LAB — POCT I-STAT, CHEM 8
Calcium, Ion: 1.07 — ABNORMAL LOW
Creatinine, Ser: 1
Glucose, Bld: 108 — ABNORMAL HIGH
HCT: 36
HCT: 37
Hemoglobin: 12.2
Hemoglobin: 12.6
Sodium: 142
TCO2: 23
TCO2: 30

## 2011-08-13 LAB — URINALYSIS, MICROSCOPIC ONLY
Bilirubin Urine: NEGATIVE
Glucose, UA: NEGATIVE
Protein, ur: NEGATIVE
pH: 6

## 2011-08-13 LAB — RAPID URINE DRUG SCREEN, HOSP PERFORMED
Amphetamines: NOT DETECTED
Benzodiazepines: POSITIVE — AB
Cocaine: POSITIVE — AB
Tetrahydrocannabinol: NOT DETECTED

## 2011-08-13 LAB — PROTIME-INR: INR: 1.2

## 2011-08-13 LAB — GLUCOSE, CAPILLARY: Glucose-Capillary: 142 — ABNORMAL HIGH

## 2011-08-13 LAB — MAGNESIUM: Magnesium: 2.1

## 2011-08-13 LAB — SAMPLE TO BLOOD BANK

## 2011-08-13 LAB — TSH: TSH: 0.511

## 2011-08-14 ENCOUNTER — Ambulatory Visit: Payer: PRIVATE HEALTH INSURANCE | Admitting: Occupational Therapy

## 2011-08-14 LAB — CBC
HCT: 32.1 — ABNORMAL LOW
HCT: 32.3 — ABNORMAL LOW
HCT: 33.3 — ABNORMAL LOW
HCT: 34.4 — ABNORMAL LOW
Hemoglobin: 10.3 — ABNORMAL LOW
Hemoglobin: 10.8 — ABNORMAL LOW
Hemoglobin: 10.9 — ABNORMAL LOW
Hemoglobin: 11 — ABNORMAL LOW
Hemoglobin: 11.3 — ABNORMAL LOW
MCHC: 32.1
MCHC: 33
MCHC: 33.3
MCV: 84.2
MCV: 85.1
Platelets: 200
Platelets: 202
RBC: 3.81 — ABNORMAL LOW
RBC: 3.87
RBC: 4.09
RDW: 15.8 — ABNORMAL HIGH
RDW: 16 — ABNORMAL HIGH
RDW: 16.1 — ABNORMAL HIGH
RDW: 16.2 — ABNORMAL HIGH
RDW: 16.4 — ABNORMAL HIGH
WBC: 5.7
WBC: 6.2
WBC: 6.4
WBC: 8.5

## 2011-08-14 LAB — HEMOGLOBIN AND HEMATOCRIT, BLOOD
Hemoglobin: 10.9 — ABNORMAL LOW
Hemoglobin: 11.4 — ABNORMAL LOW

## 2011-08-14 LAB — URINALYSIS, ROUTINE W REFLEX MICROSCOPIC
Nitrite: NEGATIVE
Protein, ur: NEGATIVE

## 2011-08-14 LAB — BASIC METABOLIC PANEL
BUN: 1 — ABNORMAL LOW
BUN: 7
BUN: <1 — ABNORMAL LOW
CO2: 29
CO2: 31
CO2: 32
CO2: 32
Calcium: 7.8 — ABNORMAL LOW
Calcium: 7.8 — ABNORMAL LOW
Calcium: 7.8 — ABNORMAL LOW
Calcium: 8.1 — ABNORMAL LOW
Calcium: 8.3 — ABNORMAL LOW
Calcium: 8.6
Creatinine, Ser: 0.65
Creatinine, Ser: 0.65
GFR calc Af Amer: >60
GFR calc non Af Amer: >60
GFR calc non Af Amer: >60
GFR calc non Af Amer: >60
GFR calc non Af Amer: >60
GFR calc non Af Amer: >60
GFR calc non Af Amer: >60
Glucose, Bld: 101 — ABNORMAL HIGH
Glucose, Bld: 101 — ABNORMAL HIGH
Glucose, Bld: 109 — ABNORMAL HIGH
Glucose, Bld: 124 — ABNORMAL HIGH
Glucose, Bld: 131 — ABNORMAL HIGH
Glucose, Bld: 195 — ABNORMAL HIGH
Glucose, Bld: 69 — ABNORMAL LOW
Glucose, Bld: 88
Potassium: 3.5
Potassium: 3.8
Sodium: 138
Sodium: 139
Sodium: 140
Sodium: 140
Sodium: 141

## 2011-08-14 LAB — URINE MICROSCOPIC-ADD ON

## 2011-08-14 LAB — URINE CULTURE
Colony Count: NO GROWTH
Special Requests: NEGATIVE

## 2011-08-14 LAB — DIFFERENTIAL
Basophils Absolute: 0
Basophils Relative: 1
Eosinophils Absolute: 0
Eosinophils Relative: 0
Lymphocytes Relative: 10 — ABNORMAL LOW
Lymphs Abs: 0.8
Monocytes Absolute: 0.8
Monocytes Relative: 9
Neutro Abs: 6.8

## 2011-08-17 ENCOUNTER — Other Ambulatory Visit: Payer: Self-pay | Admitting: Internal Medicine

## 2011-08-17 ENCOUNTER — Other Ambulatory Visit (HOSPITAL_COMMUNITY)
Admission: RE | Admit: 2011-08-17 | Discharge: 2011-08-17 | Disposition: A | Discharge status: Home / Self Care | Payer: PRIVATE HEALTH INSURANCE | Source: Ambulatory Visit | Attending: Internal Medicine | Admitting: Internal Medicine

## 2011-08-17 DIAGNOSIS — Z01419 Encounter for gynecological examination (general) (routine) without abnormal findings: Secondary | ICD-10-CM | POA: Insufficient documentation

## 2011-08-17 DIAGNOSIS — Z1159 Encounter for screening for other viral diseases: Secondary | ICD-10-CM | POA: Insufficient documentation

## 2011-08-21 ENCOUNTER — Ambulatory Visit: Payer: PRIVATE HEALTH INSURANCE | Admitting: *Deleted

## 2011-08-28 ENCOUNTER — Ambulatory Visit: Payer: PRIVATE HEALTH INSURANCE | Admitting: Occupational Therapy

## 2011-08-28 ENCOUNTER — Ambulatory Visit: Payer: PRIVATE HEALTH INSURANCE | Admitting: Physical Therapy

## 2011-08-31 ENCOUNTER — Ambulatory Visit: Payer: PRIVATE HEALTH INSURANCE | Admitting: Physical Therapy

## 2011-08-31 ENCOUNTER — Ambulatory Visit: Payer: PRIVATE HEALTH INSURANCE | Admitting: Occupational Therapy

## 2011-09-03 ENCOUNTER — Ambulatory Visit: Payer: PRIVATE HEALTH INSURANCE | Admitting: Physical Therapy

## 2011-09-03 ENCOUNTER — Ambulatory Visit: Payer: PRIVATE HEALTH INSURANCE | Admitting: Occupational Therapy

## 2011-09-05 ENCOUNTER — Ambulatory Visit: Payer: PRIVATE HEALTH INSURANCE | Admitting: Occupational Therapy

## 2011-09-05 ENCOUNTER — Ambulatory Visit: Payer: PRIVATE HEALTH INSURANCE | Admitting: Physical Therapy

## 2011-09-06 ENCOUNTER — Ambulatory Visit: Payer: PRIVATE HEALTH INSURANCE | Admitting: *Deleted

## 2011-09-10 ENCOUNTER — Ambulatory Visit: Payer: PRIVATE HEALTH INSURANCE | Admitting: Physical Therapy

## 2011-09-10 ENCOUNTER — Ambulatory Visit: Payer: PRIVATE HEALTH INSURANCE | Admitting: Occupational Therapy

## 2011-09-12 ENCOUNTER — Ambulatory Visit: Payer: PRIVATE HEALTH INSURANCE | Admitting: Occupational Therapy

## 2011-09-12 ENCOUNTER — Ambulatory Visit: Payer: PRIVATE HEALTH INSURANCE | Admitting: Physical Therapy

## 2011-09-12 NOTE — H&P (Signed)
NAME:  [name], [name] NO.:  [id number]  MEDICAL RECORD NO.:  [id number]  LOCATION:  [facility]                         FACILITY:  [facility]  PHYSICIAN:  Letha Cape, MD         DATE OF BIRTH:  [birth year]  DATE OF ADMISSION:  05/01/2011 DATE OF DISCHARGE:                             HISTORY & PHYSICAL   HISTORY OF PRESENT ILLNESS:  A 55 year old African American female presenting with new right-sided CVA, of note, history is obtained from medical records including ER documentation and neurologist evaluation as the patient is unable to give a history at this time.  The patient's chief complaint is weakness.  She tells me that she is here because her blood pressure is too high because she missed her blood pressure medications yesterday.  Her family found her this evening in her home with lethargy, and focal weakness and therefore brought her in the emergency room.  In the ER, CT scan was done which revealed a large right MCA infarct with cytotoxic edema as well as large right PCA infarct that appears more recent.  Neurology was consulted and evaluated the patient determining that she had a NIH stroke scale of 9.  The patient was not a tPA candidate given that she presented too late to the emergency room for her proper stroke.  PAST MEDICAL HISTORY:  Heart failure, hypertension, alcoholism, bipolar disorder, fibromyalgia, hyperlipidemia, history of pulmonary embolism, schizophrenia.  PAST SURGICAL HISTORY:  No known surgical history.  SOCIAL HISTORY:  Reportedly is a heavy drinker and abuses narcotics. She lives alone and also smokes.  No known drug allergies.  MEDICATIONS: 1. Amlodipine 10 mg once a day. 2. Atorvastatin 20 mg once a day. 3. Coreg 12.5 mg once a day. 4. Lyrica 75 mg b.i.d. 5. Clonidine 0.2 mg b.i.d.  FAMILY HISTORY:  Unobtainable at this time.  REVIEW OF SYSTEMS:  Unobtainable at this time.  PHYSICAL EXAMINATION:  VITAL SIGNS:  Temperature  98.6, heart rate 62, blood pressure 154/88, respirations 18, 98% on room air. GENERAL:  This is a somnolent female in no apparent distress. HEENT:  The patient is unable to hold her saliva, slurring her speech. CARDIOVASCULAR:  Regular rate and rhythm.  No murmurs, rubs, or gallops. LUNGS:  Clear to auscultation bilaterally. ABDOMEN:  Soft, nontender, nondistended.  Positive bowel sounds. EXTREMITIES:  No cyanosis, clubbing, or edema.  Left upper lower extremity mildly weakened as compared to the right upper and lower extremities, no evidence of significant facial droop.  RADIOLOGY:  CT of the head, large right MCA infarct with well-developed cytotoxic edema, large right PCA infarct which appears more recent than the MCA infarct, mass effect with complete effacement at the right lateral ventricle and 8-mm left foot midline shift.  No acute intracranial hemorrhages noted.  White count 7.8, hemoglobin 14.5, hematocrit 44, platelets 163, neutrophils 78%.  PT 12.9, INR 0.95, PTT 36.  Sodium 138, potassium 3.2, chloride 101, bicarb 26, glucose 104, BUN 11, creatinine 0.76, calcium 9.5.  Alcohol less than 11.  ASSESSMENT:  A 55 year old female with multiple medical problems presenting with too large right middle cerebral artery and posterior cerebral artery stroke. 1.  Cerebrovascular accident.  Neurology has evaluated the patient and     is going to be following along closely.  She is outside of the     window for tPA given her late presentation to the emergency room.     She is also likely out of the most severe window of edema, but we     want to monitor her closely, therefore she will be placed in an     Intensive care unit for least 24-48 hours.  Monitor I's and O's     strictly and keep her fluid balanced.  N.p.o. until she is     evaluated by Speech Therapy.  Aspirin per rectum until evaluated by     Speech Therapy and then can be switched over to oral aspirin.  We     will check a  urine drug screen to see if cocaine or any other     vasospasm-type mechanism could be component of this.  Monitor blood     pressure closely and keep her off of her antihypertensives at this     time. 2. Deep vein thrombosis prophylaxis.  Place sequential compression     devices on the patient.  No Lovenox or heparin given the size of     her strokes.          ______________________________ Letha Cape, MD     RW/MEDQ  D:  05/01/2011  T:  05/01/2011  Job:  [id number]  Electronically Signed by Virginia Rochester M.D. on 09/12/2011 10:19:35 AM

## 2011-09-13 ENCOUNTER — Encounter: Payer: PRIVATE HEALTH INSURANCE | Admitting: *Deleted

## 2011-09-17 ENCOUNTER — Encounter: Payer: PRIVATE HEALTH INSURANCE | Admitting: Occupational Therapy

## 2011-09-17 ENCOUNTER — Ambulatory Visit: Payer: PRIVATE HEALTH INSURANCE | Admitting: Physical Therapy

## 2011-09-19 ENCOUNTER — Encounter: Payer: PRIVATE HEALTH INSURANCE | Admitting: Occupational Therapy

## 2011-09-19 ENCOUNTER — Ambulatory Visit: Payer: PRIVATE HEALTH INSURANCE | Admitting: Physical Therapy

## 2011-09-24 ENCOUNTER — Ambulatory Visit: Payer: PRIVATE HEALTH INSURANCE | Attending: Internal Medicine | Admitting: Physical Therapy

## 2011-09-24 ENCOUNTER — Ambulatory Visit: Payer: PRIVATE HEALTH INSURANCE | Admitting: Occupational Therapy

## 2011-09-24 DIAGNOSIS — R269 Unspecified abnormalities of gait and mobility: Secondary | ICD-10-CM | POA: Insufficient documentation

## 2011-09-24 DIAGNOSIS — I69919 Unspecified symptoms and signs involving cognitive functions following unspecified cerebrovascular disease: Secondary | ICD-10-CM | POA: Insufficient documentation

## 2011-09-24 DIAGNOSIS — I69998 Other sequelae following unspecified cerebrovascular disease: Secondary | ICD-10-CM | POA: Insufficient documentation

## 2011-09-24 DIAGNOSIS — M6281 Muscle weakness (generalized): Secondary | ICD-10-CM | POA: Insufficient documentation

## 2011-09-24 DIAGNOSIS — Z5189 Encounter for other specified aftercare: Secondary | ICD-10-CM | POA: Insufficient documentation

## 2011-09-25 ENCOUNTER — Ambulatory Visit: Payer: PRIVATE HEALTH INSURANCE

## 2011-09-27 ENCOUNTER — Encounter: Payer: PRIVATE HEALTH INSURANCE | Admitting: Occupational Therapy

## 2011-09-27 ENCOUNTER — Ambulatory Visit: Payer: PRIVATE HEALTH INSURANCE | Admitting: Physical Therapy

## 2011-10-01 ENCOUNTER — Ambulatory Visit: Payer: PRIVATE HEALTH INSURANCE

## 2011-10-02 ENCOUNTER — Ambulatory Visit: Payer: PRIVATE HEALTH INSURANCE | Admitting: Physical Therapy

## 2011-10-02 ENCOUNTER — Ambulatory Visit: Payer: PRIVATE HEALTH INSURANCE | Admitting: Occupational Therapy

## 2011-10-03 ENCOUNTER — Ambulatory Visit: Payer: PRIVATE HEALTH INSURANCE

## 2011-10-08 ENCOUNTER — Ambulatory Visit: Payer: PRIVATE HEALTH INSURANCE | Admitting: Physical Therapy

## 2011-10-10 ENCOUNTER — Encounter: Payer: PRIVATE HEALTH INSURANCE | Admitting: Speech Pathology

## 2011-10-10 ENCOUNTER — Ambulatory Visit: Payer: PRIVATE HEALTH INSURANCE | Admitting: Physical Therapy

## 2011-10-10 ENCOUNTER — Encounter: Payer: PRIVATE HEALTH INSURANCE | Admitting: Occupational Therapy

## 2011-10-23 ENCOUNTER — Ambulatory Visit: Payer: Medicare Other | Attending: Internal Medicine

## 2011-10-23 DIAGNOSIS — Z5189 Encounter for other specified aftercare: Secondary | ICD-10-CM | POA: Insufficient documentation

## 2011-10-23 DIAGNOSIS — R269 Unspecified abnormalities of gait and mobility: Secondary | ICD-10-CM | POA: Insufficient documentation

## 2011-10-23 DIAGNOSIS — I69998 Other sequelae following unspecified cerebrovascular disease: Secondary | ICD-10-CM | POA: Insufficient documentation

## 2011-10-23 DIAGNOSIS — I69919 Unspecified symptoms and signs involving cognitive functions following unspecified cerebrovascular disease: Secondary | ICD-10-CM | POA: Insufficient documentation

## 2011-10-23 DIAGNOSIS — M6281 Muscle weakness (generalized): Secondary | ICD-10-CM | POA: Insufficient documentation

## 2011-10-26 ENCOUNTER — Ambulatory Visit
Admission: RE | Admit: 2011-10-26 | Discharge: 2011-10-26 | Disposition: A | Discharge status: Home / Self Care | Payer: Medicare Other | Source: Ambulatory Visit | Attending: Internal Medicine | Admitting: Internal Medicine

## 2011-10-26 ENCOUNTER — Other Ambulatory Visit: Payer: Self-pay | Admitting: Internal Medicine

## 2011-10-26 DIAGNOSIS — I639 Cerebral infarction, unspecified: Secondary | ICD-10-CM

## 2011-10-29 ENCOUNTER — Ambulatory Visit: Payer: Medicare Other

## 2011-11-01 ENCOUNTER — Ambulatory Visit: Payer: Medicare Other

## 2011-12-10 ENCOUNTER — Ambulatory Visit: Payer: PRIVATE HEALTH INSURANCE | Admitting: Physical Therapy

## 2011-12-20 ENCOUNTER — Ambulatory Visit: Payer: PRIVATE HEALTH INSURANCE | Attending: Internal Medicine | Admitting: Physical Therapy

## 2011-12-20 DIAGNOSIS — M6281 Muscle weakness (generalized): Secondary | ICD-10-CM | POA: Insufficient documentation

## 2011-12-20 DIAGNOSIS — R269 Unspecified abnormalities of gait and mobility: Secondary | ICD-10-CM | POA: Insufficient documentation

## 2011-12-20 DIAGNOSIS — I69919 Unspecified symptoms and signs involving cognitive functions following unspecified cerebrovascular disease: Secondary | ICD-10-CM | POA: Insufficient documentation

## 2011-12-20 DIAGNOSIS — I69998 Other sequelae following unspecified cerebrovascular disease: Secondary | ICD-10-CM | POA: Insufficient documentation

## 2011-12-20 DIAGNOSIS — Z5189 Encounter for other specified aftercare: Secondary | ICD-10-CM | POA: Insufficient documentation

## 2011-12-27 ENCOUNTER — Ambulatory Visit: Payer: Medicare Other | Admitting: Physical Therapy

## 2012-01-01 ENCOUNTER — Ambulatory Visit: Payer: Medicare Other | Admitting: Physical Therapy

## 2012-01-02 ENCOUNTER — Ambulatory Visit: Payer: PRIVATE HEALTH INSURANCE | Admitting: Physical Therapy

## 2012-01-04 ENCOUNTER — Ambulatory Visit: Payer: PRIVATE HEALTH INSURANCE | Admitting: Physical Therapy

## 2012-01-09 ENCOUNTER — Ambulatory Visit: Payer: Medicare Other | Admitting: Physical Therapy

## 2012-01-11 ENCOUNTER — Ambulatory Visit: Payer: Medicare Other | Admitting: Physical Therapy

## 2012-01-22 ENCOUNTER — Ambulatory Visit: Payer: Medicare Other | Attending: Internal Medicine | Admitting: Physical Therapy

## 2012-01-22 DIAGNOSIS — Z5189 Encounter for other specified aftercare: Secondary | ICD-10-CM | POA: Insufficient documentation

## 2012-01-22 DIAGNOSIS — R269 Unspecified abnormalities of gait and mobility: Secondary | ICD-10-CM | POA: Insufficient documentation

## 2012-01-22 DIAGNOSIS — I69919 Unspecified symptoms and signs involving cognitive functions following unspecified cerebrovascular disease: Secondary | ICD-10-CM | POA: Insufficient documentation

## 2012-01-22 DIAGNOSIS — M6281 Muscle weakness (generalized): Secondary | ICD-10-CM | POA: Insufficient documentation

## 2012-01-22 DIAGNOSIS — I69998 Other sequelae following unspecified cerebrovascular disease: Secondary | ICD-10-CM | POA: Insufficient documentation

## 2012-01-25 ENCOUNTER — Ambulatory Visit: Payer: Medicare Other | Admitting: Physical Therapy

## 2012-01-28 ENCOUNTER — Ambulatory Visit: Payer: Medicare Other | Admitting: Physical Therapy

## 2012-01-30 ENCOUNTER — Ambulatory Visit: Payer: Medicare Other | Admitting: Physical Therapy

## 2012-02-01 ENCOUNTER — Other Ambulatory Visit: Payer: Self-pay | Admitting: Neurology

## 2012-02-01 DIAGNOSIS — G40209 Localization-related (focal) (partial) symptomatic epilepsy and epileptic syndromes with complex partial seizures, not intractable, without status epilepticus: Secondary | ICD-10-CM

## 2012-02-14 ENCOUNTER — Ambulatory Visit
Admission: RE | Admit: 2012-02-14 | Discharge: 2012-02-14 | Disposition: A | Discharge status: Home / Self Care | Payer: Medicare Other | Source: Ambulatory Visit | Attending: Neurology | Admitting: Neurology

## 2012-02-14 DIAGNOSIS — G40209 Localization-related (focal) (partial) symptomatic epilepsy and epileptic syndromes with complex partial seizures, not intractable, without status epilepticus: Secondary | ICD-10-CM

## 2012-02-14 MED ORDER — GADOBENATE DIMEGLUMINE 529 MG/ML IV SOLN
10.0000 mL | Freq: Once | INTRAVENOUS | Status: AC | PRN
  Administered 2012-02-14: 10 mL via INTRAVENOUS

## 2012-02-27 ENCOUNTER — Ambulatory Visit: Payer: PRIVATE HEALTH INSURANCE | Attending: Internal Medicine | Admitting: Occupational Therapy

## 2012-02-27 DIAGNOSIS — I69998 Other sequelae following unspecified cerebrovascular disease: Secondary | ICD-10-CM | POA: Insufficient documentation

## 2012-02-27 DIAGNOSIS — R269 Unspecified abnormalities of gait and mobility: Secondary | ICD-10-CM | POA: Insufficient documentation

## 2012-02-27 DIAGNOSIS — Z5189 Encounter for other specified aftercare: Secondary | ICD-10-CM | POA: Insufficient documentation

## 2012-02-27 DIAGNOSIS — I69919 Unspecified symptoms and signs involving cognitive functions following unspecified cerebrovascular disease: Secondary | ICD-10-CM | POA: Insufficient documentation

## 2012-02-27 DIAGNOSIS — M6281 Muscle weakness (generalized): Secondary | ICD-10-CM | POA: Insufficient documentation

## 2012-03-05 ENCOUNTER — Ambulatory Visit: Payer: PRIVATE HEALTH INSURANCE | Admitting: Occupational Therapy

## 2012-03-06 ENCOUNTER — Ambulatory Visit: Payer: PRIVATE HEALTH INSURANCE | Admitting: Occupational Therapy

## 2012-03-11 ENCOUNTER — Encounter: Payer: Medicare Other | Admitting: Occupational Therapy

## 2012-03-13 ENCOUNTER — Encounter: Payer: Medicare Other | Admitting: Occupational Therapy

## 2012-03-17 ENCOUNTER — Encounter: Payer: Medicare Other | Admitting: Occupational Therapy

## 2012-03-19 ENCOUNTER — Encounter: Payer: Medicare Other | Admitting: Occupational Therapy

## 2012-03-24 ENCOUNTER — Encounter: Payer: Medicare Other | Admitting: Occupational Therapy

## 2012-03-25 ENCOUNTER — Encounter: Payer: Medicare Other | Admitting: Occupational Therapy

## 2012-03-26 ENCOUNTER — Encounter: Payer: Medicare Other | Admitting: Occupational Therapy

## 2012-04-01 ENCOUNTER — Encounter: Payer: Medicare Other | Admitting: Occupational Therapy

## 2012-04-15 ENCOUNTER — Encounter: Payer: Medicare Other | Admitting: Occupational Therapy

## 2012-12-03 ENCOUNTER — Ambulatory Visit: Payer: PRIVATE HEALTH INSURANCE | Admitting: Rehabilitative and Restorative Service Providers"

## 2012-12-10 ENCOUNTER — Ambulatory Visit: Payer: PRIVATE HEALTH INSURANCE | Admitting: Physical Therapy

## 2012-12-10 ENCOUNTER — Ambulatory Visit: Payer: PRIVATE HEALTH INSURANCE | Admitting: Occupational Therapy

## 2012-12-16 ENCOUNTER — Ambulatory Visit: Payer: PRIVATE HEALTH INSURANCE | Attending: Internal Medicine | Admitting: Physical Therapy

## 2012-12-23 ENCOUNTER — Ambulatory Visit: Payer: PRIVATE HEALTH INSURANCE | Admitting: Occupational Therapy

## 2013-03-25 ENCOUNTER — Ambulatory Visit: Payer: PRIVATE HEALTH INSURANCE | Admitting: Physical Therapy

## 2013-03-26 ENCOUNTER — Telehealth: Payer: Self-pay

## 2013-03-26 ENCOUNTER — Ambulatory Visit: Payer: PRIVATE HEALTH INSURANCE | Attending: Internal Medicine

## 2013-04-15 ENCOUNTER — Ambulatory Visit: Payer: PRIVATE HEALTH INSURANCE | Attending: Internal Medicine | Admitting: Occupational Therapy

## 2013-04-15 ENCOUNTER — Ambulatory Visit: Payer: PRIVATE HEALTH INSURANCE | Admitting: *Deleted

## 2013-04-15 DIAGNOSIS — R269 Unspecified abnormalities of gait and mobility: Secondary | ICD-10-CM | POA: Insufficient documentation

## 2013-04-15 DIAGNOSIS — M6281 Muscle weakness (generalized): Secondary | ICD-10-CM | POA: Insufficient documentation

## 2013-04-15 DIAGNOSIS — IMO0001 Reserved for inherently not codable concepts without codable children: Secondary | ICD-10-CM | POA: Insufficient documentation

## 2013-04-27 ENCOUNTER — Ambulatory Visit: Payer: PRIVATE HEALTH INSURANCE | Admitting: Physical Therapy

## 2013-05-06 ENCOUNTER — Ambulatory Visit: Payer: PRIVATE HEALTH INSURANCE | Admitting: Physical Therapy

## 2013-05-13 ENCOUNTER — Ambulatory Visit: Payer: PRIVATE HEALTH INSURANCE | Attending: Internal Medicine | Admitting: Physical Therapy

## 2013-05-13 DIAGNOSIS — R269 Unspecified abnormalities of gait and mobility: Secondary | ICD-10-CM | POA: Insufficient documentation

## 2013-05-13 DIAGNOSIS — IMO0001 Reserved for inherently not codable concepts without codable children: Secondary | ICD-10-CM | POA: Insufficient documentation

## 2013-05-13 DIAGNOSIS — M6281 Muscle weakness (generalized): Secondary | ICD-10-CM | POA: Insufficient documentation

## 2015-08-11 ENCOUNTER — Other Ambulatory Visit (HOSPITAL_COMMUNITY)
Admission: RE | Admit: 2015-08-11 | Discharge: 2015-08-11 | Disposition: A | Discharge status: Home / Self Care | Payer: Medicare Other | Source: Ambulatory Visit | Attending: Internal Medicine | Admitting: Internal Medicine

## 2015-08-11 ENCOUNTER — Other Ambulatory Visit: Payer: Self-pay | Admitting: Internal Medicine

## 2015-08-11 DIAGNOSIS — Z1151 Encounter for screening for human papillomavirus (HPV): Secondary | ICD-10-CM | POA: Diagnosis present

## 2015-08-11 DIAGNOSIS — Z01419 Encounter for gynecological examination (general) (routine) without abnormal findings: Secondary | ICD-10-CM | POA: Diagnosis present

## 2015-08-15 LAB — CYTOLOGY - PAP

## 2016-12-12 ENCOUNTER — Other Ambulatory Visit (HOSPITAL_COMMUNITY)
Admission: RE | Admit: 2016-12-12 | Discharge: 2016-12-12 | Disposition: A | Discharge status: Home / Self Care | Payer: Medicare Other | Source: Ambulatory Visit | Attending: Internal Medicine | Admitting: Internal Medicine

## 2016-12-12 ENCOUNTER — Other Ambulatory Visit: Payer: Self-pay | Admitting: Internal Medicine

## 2016-12-12 DIAGNOSIS — Z124 Encounter for screening for malignant neoplasm of cervix: Secondary | ICD-10-CM | POA: Insufficient documentation

## 2016-12-17 LAB — CYTOLOGY - PAP: Diagnosis: NEGATIVE

## 2018-12-23 ENCOUNTER — Other Ambulatory Visit: Payer: Self-pay | Admitting: Internal Medicine

## 2018-12-23 DIAGNOSIS — Z1231 Encounter for screening mammogram for malignant neoplasm of breast: Secondary | ICD-10-CM

## 2019-01-21 ENCOUNTER — Ambulatory Visit: Payer: Medicare Other

## 2019-02-03 ENCOUNTER — Encounter (HOSPITAL_COMMUNITY): Payer: Self-pay

## 2019-02-03 ENCOUNTER — Emergency Department (HOSPITAL_COMMUNITY): Payer: Medicare Other

## 2019-02-03 ENCOUNTER — Inpatient Hospital Stay (HOSPITAL_COMMUNITY)
Admission: EM | Admit: 2019-02-03 | Discharge: 2019-02-06 | DRG: 065 | Disposition: A | Discharge status: Rehabilitation Facility | Payer: Medicare Other | Attending: Student in an Organized Health Care Education/Training Program | Admitting: Student in an Organized Health Care Education/Training Program

## 2019-02-03 ENCOUNTER — Other Ambulatory Visit: Payer: Self-pay

## 2019-02-03 ENCOUNTER — Inpatient Hospital Stay (HOSPITAL_COMMUNITY): Payer: Medicare Other

## 2019-02-03 DIAGNOSIS — R29712 NIHSS score 12: Secondary | ICD-10-CM | POA: Diagnosis present

## 2019-02-03 DIAGNOSIS — F1410 Cocaine abuse, uncomplicated: Secondary | ICD-10-CM | POA: Diagnosis present

## 2019-02-03 DIAGNOSIS — F1020 Alcohol dependence, uncomplicated: Secondary | ICD-10-CM | POA: Diagnosis present

## 2019-02-03 DIAGNOSIS — I63511 Cerebral infarction due to unspecified occlusion or stenosis of right middle cerebral artery: Secondary | ICD-10-CM | POA: Diagnosis present

## 2019-02-03 DIAGNOSIS — K5900 Constipation, unspecified: Secondary | ICD-10-CM | POA: Diagnosis present

## 2019-02-03 DIAGNOSIS — I6521 Occlusion and stenosis of right carotid artery: Secondary | ICD-10-CM | POA: Diagnosis present

## 2019-02-03 DIAGNOSIS — F209 Schizophrenia, unspecified: Secondary | ICD-10-CM | POA: Diagnosis present

## 2019-02-03 DIAGNOSIS — Z86711 Personal history of pulmonary embolism: Secondary | ICD-10-CM

## 2019-02-03 DIAGNOSIS — G8114 Spastic hemiplegia affecting left nondominant side: Secondary | ICD-10-CM | POA: Diagnosis not present

## 2019-02-03 DIAGNOSIS — D696 Thrombocytopenia, unspecified: Secondary | ICD-10-CM | POA: Diagnosis present

## 2019-02-03 DIAGNOSIS — Z7902 Long term (current) use of antithrombotics/antiplatelets: Secondary | ICD-10-CM | POA: Diagnosis not present

## 2019-02-03 DIAGNOSIS — I69392 Facial weakness following cerebral infarction: Secondary | ICD-10-CM | POA: Diagnosis not present

## 2019-02-03 DIAGNOSIS — R296 Repeated falls: Secondary | ICD-10-CM | POA: Diagnosis present

## 2019-02-03 DIAGNOSIS — Z8249 Family history of ischemic heart disease and other diseases of the circulatory system: Secondary | ICD-10-CM

## 2019-02-03 DIAGNOSIS — I671 Cerebral aneurysm, nonruptured: Secondary | ICD-10-CM

## 2019-02-03 DIAGNOSIS — I63231 Cerebral infarction due to unspecified occlusion or stenosis of right carotid arteries: Secondary | ICD-10-CM

## 2019-02-03 DIAGNOSIS — R29810 Facial weakness: Secondary | ICD-10-CM | POA: Diagnosis present

## 2019-02-03 DIAGNOSIS — I10 Essential (primary) hypertension: Secondary | ICD-10-CM | POA: Diagnosis not present

## 2019-02-03 DIAGNOSIS — F17200 Nicotine dependence, unspecified, uncomplicated: Secondary | ICD-10-CM | POA: Diagnosis present

## 2019-02-03 DIAGNOSIS — G40909 Epilepsy, unspecified, not intractable, without status epilepticus: Secondary | ICD-10-CM | POA: Diagnosis not present

## 2019-02-03 DIAGNOSIS — F319 Bipolar disorder, unspecified: Secondary | ICD-10-CM | POA: Diagnosis present

## 2019-02-03 DIAGNOSIS — R0989 Other specified symptoms and signs involving the circulatory and respiratory systems: Secondary | ICD-10-CM | POA: Diagnosis present

## 2019-02-03 DIAGNOSIS — D72819 Decreased white blood cell count, unspecified: Secondary | ICD-10-CM | POA: Diagnosis present

## 2019-02-03 DIAGNOSIS — F17210 Nicotine dependence, cigarettes, uncomplicated: Secondary | ICD-10-CM | POA: Diagnosis present

## 2019-02-03 DIAGNOSIS — G8110 Spastic hemiplegia affecting unspecified side: Secondary | ICD-10-CM | POA: Diagnosis not present

## 2019-02-03 DIAGNOSIS — G8194 Hemiplegia, unspecified affecting left nondominant side: Secondary | ICD-10-CM | POA: Diagnosis present

## 2019-02-03 DIAGNOSIS — Z79899 Other long term (current) drug therapy: Secondary | ICD-10-CM | POA: Diagnosis not present

## 2019-02-03 DIAGNOSIS — I69322 Dysarthria following cerebral infarction: Secondary | ICD-10-CM | POA: Diagnosis not present

## 2019-02-03 DIAGNOSIS — E785 Hyperlipidemia, unspecified: Secondary | ICD-10-CM | POA: Diagnosis present

## 2019-02-03 DIAGNOSIS — I720 Aneurysm of carotid artery: Secondary | ICD-10-CM

## 2019-02-03 DIAGNOSIS — I169 Hypertensive crisis, unspecified: Secondary | ICD-10-CM | POA: Diagnosis not present

## 2019-02-03 DIAGNOSIS — Z7982 Long term (current) use of aspirin: Secondary | ICD-10-CM | POA: Diagnosis not present

## 2019-02-03 DIAGNOSIS — I5031 Acute diastolic (congestive) heart failure: Secondary | ICD-10-CM | POA: Diagnosis not present

## 2019-02-03 DIAGNOSIS — G441 Vascular headache, not elsewhere classified: Secondary | ICD-10-CM | POA: Diagnosis present

## 2019-02-03 DIAGNOSIS — F1910 Other psychoactive substance abuse, uncomplicated: Secondary | ICD-10-CM | POA: Diagnosis present

## 2019-02-03 DIAGNOSIS — E46 Unspecified protein-calorie malnutrition: Secondary | ICD-10-CM | POA: Diagnosis present

## 2019-02-03 DIAGNOSIS — I110 Hypertensive heart disease with heart failure: Secondary | ICD-10-CM | POA: Diagnosis present

## 2019-02-03 DIAGNOSIS — E876 Hypokalemia: Secondary | ICD-10-CM | POA: Diagnosis present

## 2019-02-03 DIAGNOSIS — I639 Cerebral infarction, unspecified: Secondary | ICD-10-CM | POA: Diagnosis present

## 2019-02-03 DIAGNOSIS — I69354 Hemiplegia and hemiparesis following cerebral infarction affecting left non-dominant side: Secondary | ICD-10-CM | POA: Diagnosis present

## 2019-02-03 DIAGNOSIS — I5032 Chronic diastolic (congestive) heart failure: Secondary | ICD-10-CM | POA: Diagnosis present

## 2019-02-03 DIAGNOSIS — E871 Hypo-osmolality and hyponatremia: Secondary | ICD-10-CM | POA: Diagnosis present

## 2019-02-03 DIAGNOSIS — Z8673 Personal history of transient ischemic attack (TIA), and cerebral infarction without residual deficits: Secondary | ICD-10-CM | POA: Diagnosis not present

## 2019-02-03 DIAGNOSIS — F1490 Cocaine use, unspecified, uncomplicated: Secondary | ICD-10-CM | POA: Diagnosis not present

## 2019-02-03 DIAGNOSIS — R569 Unspecified convulsions: Secondary | ICD-10-CM | POA: Diagnosis not present

## 2019-02-03 DIAGNOSIS — R509 Fever, unspecified: Secondary | ICD-10-CM | POA: Diagnosis not present

## 2019-02-03 DIAGNOSIS — I6359 Cerebral infarction due to unspecified occlusion or stenosis of other cerebral artery: Secondary | ICD-10-CM | POA: Diagnosis not present

## 2019-02-03 DIAGNOSIS — Z9181 History of falling: Secondary | ICD-10-CM | POA: Diagnosis not present

## 2019-02-03 DIAGNOSIS — R7303 Prediabetes: Secondary | ICD-10-CM | POA: Diagnosis present

## 2019-02-03 HISTORY — DX: Heart failure, unspecified: I50.9

## 2019-02-03 HISTORY — DX: Essential (primary) hypertension: I10

## 2019-02-03 HISTORY — DX: Encounter for other specified aftercare: Z51.89

## 2019-02-03 HISTORY — DX: Cerebral infarction, unspecified: I63.9

## 2019-02-03 LAB — URINALYSIS, ROUTINE W REFLEX MICROSCOPIC
Bacteria, UA: NONE SEEN
Bilirubin Urine: NEGATIVE
Glucose, UA: NEGATIVE mg/dL
Ketones, ur: NEGATIVE mg/dL
Leukocytes,Ua: NEGATIVE
Nitrite: NEGATIVE
Protein, ur: NEGATIVE mg/dL
SPECIFIC GRAVITY, URINE: 1.014 (ref 1.005–1.030)
pH: 7 (ref 5.0–8.0)

## 2019-02-03 LAB — CBC
HCT: 47 % — ABNORMAL HIGH (ref 36.0–46.0)
Hemoglobin: 14.1 g/dL (ref 12.0–15.0)
MCH: 25.3 pg — ABNORMAL LOW (ref 26.0–34.0)
MCHC: 30 g/dL (ref 30.0–36.0)
MCV: 84.2 fL (ref 80.0–100.0)
NRBC: 0 % (ref 0.0–0.2)
Platelets: 170 10*3/uL (ref 150–400)
RBC: 5.58 MIL/uL — ABNORMAL HIGH (ref 3.87–5.11)
RDW: 14.9 % (ref 11.5–15.5)
WBC: 4.9 10*3/uL (ref 4.0–10.5)

## 2019-02-03 LAB — COMPREHENSIVE METABOLIC PANEL
ALBUMIN: 4 g/dL (ref 3.5–5.0)
ALT: 10 U/L (ref 0–44)
AST: 19 U/L (ref 15–41)
Alkaline Phosphatase: 63 U/L (ref 38–126)
Anion gap: 3 — ABNORMAL LOW (ref 5–15)
BILIRUBIN TOTAL: 0.5 mg/dL (ref 0.3–1.2)
BUN: 11 mg/dL (ref 8–23)
CO2: 22 mmol/L (ref 22–32)
Calcium: 9 mg/dL (ref 8.9–10.3)
Chloride: 116 mmol/L — ABNORMAL HIGH (ref 98–111)
Creatinine, Ser: 1.04 mg/dL — ABNORMAL HIGH (ref 0.44–1.00)
GFR calc Af Amer: >60 mL/min (ref >60)
GFR calc non Af Amer: 58 mL/min — ABNORMAL LOW (ref >60)
GLUCOSE: 105 mg/dL — AB (ref 70–99)
Potassium: 3.7 mmol/L (ref 3.5–5.1)
Sodium: 141 mmol/L (ref 135–145)
Total Protein: 7 g/dL (ref 6.5–8.1)

## 2019-02-03 LAB — DIFFERENTIAL
Abs Immature Granulocytes: 0.01 10*3/uL (ref 0.00–0.07)
Basophils Absolute: 0 10*3/uL (ref 0.0–0.1)
Basophils Relative: 0 %
Eosinophils Absolute: 0 10*3/uL (ref 0.0–0.5)
Eosinophils Relative: 1 %
IMMATURE GRANULOCYTES: 0 %
Lymphocytes Relative: 26 %
Lymphs Abs: 1.3 10*3/uL (ref 0.7–4.0)
Monocytes Absolute: 0.4 10*3/uL (ref 0.1–1.0)
Monocytes Relative: 7 %
Neutro Abs: 3.2 10*3/uL (ref 1.7–7.7)
Neutrophils Relative %: 66 %

## 2019-02-03 LAB — HEMOGLOBIN A1C
Hgb A1c MFr Bld: 5.8 % — ABNORMAL HIGH (ref 4.8–5.6)
Mean Plasma Glucose: 119.76 mg/dL

## 2019-02-03 LAB — RAPID URINE DRUG SCREEN, HOSP PERFORMED
Amphetamines: NOT DETECTED
Barbiturates: NOT DETECTED
Benzodiazepines: NOT DETECTED
Cocaine: POSITIVE — AB
Opiates: NOT DETECTED
Tetrahydrocannabinol: NOT DETECTED

## 2019-02-03 LAB — I-STAT CREATININE, ED: Creatinine, Ser: 1 mg/dL (ref 0.44–1.00)

## 2019-02-03 LAB — APTT: aPTT: 28 seconds (ref 24–36)

## 2019-02-03 LAB — CBG MONITORING, ED: Glucose-Capillary: 91 mg/dL (ref 70–99)

## 2019-02-03 LAB — PROTIME-INR
INR: 1 (ref 0.8–1.2)
Prothrombin Time: 13.5 seconds (ref 11.4–15.2)

## 2019-02-03 MED ORDER — SENNOSIDES-DOCUSATE SODIUM 8.6-50 MG PO TABS: 1.0000 | ORAL_TABLET | Freq: Every evening | ORAL | Status: DC | PRN

## 2019-02-03 MED ORDER — SODIUM CHLORIDE 0.9 % IV BOLUS
500.0000 mL | Freq: Once | INTRAVENOUS | Status: AC
  Administered 2019-02-03: 500 mL via INTRAVENOUS

## 2019-02-03 MED ORDER — ACETAMINOPHEN 650 MG RE SUPP: 650.0000 mg | Freq: Once | RECTAL | Status: DC

## 2019-02-03 MED ORDER — ACETAMINOPHEN 650 MG RE SUPP: 650.0000 mg | RECTAL | Status: DC | PRN

## 2019-02-03 MED ORDER — SODIUM CHLORIDE 0.9 % IV SOLN: INTRAVENOUS | Status: AC

## 2019-02-03 MED ORDER — ACETAMINOPHEN 160 MG/5ML PO SOLN: 650.0000 mg | ORAL | Status: DC | PRN

## 2019-02-03 MED ORDER — STROKE: EARLY STAGES OF RECOVERY BOOK
Freq: Once | Status: AC
  Administered 2019-02-03: 21:00:00

## 2019-02-03 MED ORDER — SODIUM CHLORIDE 0.9 % IV SOLN
100.0000 mL/h | INTRAVENOUS | Status: DC
  Administered 2019-02-03 – 2019-02-06 (×3): 100 mL/h via INTRAVENOUS

## 2019-02-03 MED ORDER — ACETAMINOPHEN 500 MG PO TABS
1000.0000 mg | ORAL_TABLET | Freq: Once | ORAL | Status: AC
  Administered 2019-02-03: 1000 mg via ORAL
  Filled 2019-02-03: qty 2

## 2019-02-03 MED ORDER — ASPIRIN 325 MG PO TABS
325.0000 mg | ORAL_TABLET | Freq: Every day | ORAL | Status: DC
  Administered 2019-02-03 – 2019-02-06 (×4): 325 mg via ORAL
  Filled 2019-02-03 (×4): qty 1

## 2019-02-03 MED ORDER — ASPIRIN 300 MG RE SUPP: 300.0000 mg | Freq: Every day | RECTAL | Status: DC

## 2019-02-03 MED ORDER — ENOXAPARIN SODIUM 40 MG/0.4ML ~~LOC~~ SOLN
40.0000 mg | SUBCUTANEOUS | Status: DC
  Administered 2019-02-03 – 2019-02-05 (×3): 40 mg via SUBCUTANEOUS
  Filled 2019-02-03 (×3): qty 0.4

## 2019-02-03 MED ORDER — IOHEXOL 350 MG/ML SOLN
75.0000 mL | Freq: Once | INTRAVENOUS | Status: AC | PRN
  Administered 2019-02-03: 100 mL via INTRAVENOUS

## 2019-02-03 MED ORDER — ATORVASTATIN CALCIUM 40 MG PO TABS
40.0000 mg | ORAL_TABLET | Freq: Every day | ORAL | Status: DC
  Administered 2019-02-04 – 2019-02-05 (×2): 40 mg via ORAL
  Filled 2019-02-03 (×2): qty 1

## 2019-02-03 MED ORDER — ACETAMINOPHEN 325 MG PO TABS
650.0000 mg | ORAL_TABLET | ORAL | Status: DC | PRN
  Administered 2019-02-04 – 2019-02-05 (×3): 650 mg via ORAL
  Filled 2019-02-03 (×3): qty 2

## 2019-02-03 NOTE — ED Triage Notes (Signed)
[facility]- pt from home. Pt has a fall x2 at home. Pt lives in the apartment with family and they witnessed the falls. Pt has a history of stroke in the past with left sided deficits. No LOC. Pt not complaining of any pain but only a headache.    BP 170/90 HR 88 RR 16 Temp 99.8

## 2019-02-03 NOTE — ED Provider Notes (Signed)
Signout from Dr. Jeraldine Loots.  63 year old with prior history of strokes with residual left hemiparesis here with dizziness and falls. Physical Exam  BP (!) 169/95   Pulse 67   Temp 98.6 F (37 C) (Oral)   Resp 16   Ht 5\' 2"  (1.575 m)   Wt 61.2 kg   SpO2 100%   BMI 24.69 kg/m   Physical Exam  ED Course/Procedures   Clinical Course as of Feb 04 1239  Tue Feb 03, 2019  1654 Patient's MRI shows new areas of ischemic stroke.  Discussed with Dr. Jerrell Belfast from neurology who recommends the patient be admitted and they will consult.   [MB]    Clinical Course User Index [MB] Terrilee Files, MD    Procedures  MDM  Plan is to follow-up on MRI.  If no acute stroke likely could be discharged.  If acute stroke will need to consult neurology.       Terrilee Files, MD 02/04/19 1240

## 2019-02-03 NOTE — ED Provider Notes (Addendum)
[facility]   CSN: [id number] Arrival date & time: 02/03/19  1139    History   Chief Complaint Chief Complaint  Patient presents with  . Fall    HPI [name] is a 63 y.o. female.     HPI Patient presents with concern of worsening weakness, and now after several falls. Patient has a history of multiple prior strokes, has baseline left sided deficits. She denies changes in these. Yesterday she developed a headache, and with worsening weakness today has had several falls. She seems to recall all of the falls in their entirety, denies any substantial head trauma, or injuries to other parts of her body. She does, however, have diffuse head pain, described as sore. Is unclear if she has taken any medication for pain control.    Past Medical History:  Diagnosis Date  . Blood transfusion without reported diagnosis   . CHF (congestive heart failure) (HCC)   . Hypertension   . Stroke [facility](HCC)     There are no active problems to display for this patient.   Past Surgical History:  Procedure Laterality Date  . ABDOMINAL SURGERY       OB History   No obstetric history on file.      Home Medications    Prior to Admission medications   Not on File    Family History No family history on file.  Social History Social History   Tobacco Use  . Smoking status: Current Every Day Smoker  . Smokeless tobacco: Never Used  Substance Use Topics  . Alcohol use: Never    Frequency: Never  . Drug use: Never     Allergies   Patient has no known allergies.   Review of Systems Review of Systems  Constitutional:       Per HPI, otherwise negative  HENT:       Per HPI, otherwise negative  Respiratory:       Per HPI, otherwise negative  Cardiovascular:       Per HPI, otherwise negative  Gastrointestinal: Negative for vomiting.  Endocrine:       Negative aside from HPI  Genitourinary:       Neg aside from  HPI   Musculoskeletal:       Per HPI, otherwise negative  Skin: Negative.   Neurological: Positive for weakness and headaches. Negative for syncope.     Physical Exam Updated Vital Signs BP (!) 147/119   Pulse 65   Temp 98.6 F (37 C) (Oral)   Resp 17   Ht 5\' 2"  (1.575 m)   Wt 61.2 kg   SpO2 97%   BMI 24.69 kg/m   Physical Exam Vitals signs and nursing note reviewed.  Constitutional:      Appearance: She is ill-appearing.  HENT:     Head: Normocephalic and atraumatic.     Comments: Many missing teeth, facial asymmetry, left-sided droop, patient notes this is similar to baseline Eyes:     Conjunctiva/sclera: Conjunctivae normal.  Cardiovascular:     Rate and Rhythm: Normal rate and regular rhythm.  Pulmonary:     Effort: Pulmonary effort is normal. No respiratory distress.     Breath sounds: Normal breath sounds. No stridor.  Abdominal:     General: There is no distension.  Skin:    General: Skin is warm and dry.  Neurological:     Mental Status: She is alert and oriented to person, place, and time.  Cranial Nerves: No cranial nerve deficit.     Comments: Left-sided hemiplegia, with left upper arm contracture, left lower extremity weakness that is substantial, 0.5/5 throughout. Right-sided strength notable for atrophy, but she is able to hold her arm and leg against gravity.  Psychiatric:        Cognition and Memory: Cognition is not impaired.      ED Treatments / Results  Labs (all labs ordered are listed, but only abnormal results are displayed) Labs Reviewed  CBC - Abnormal; Notable for the following components:      Result Value   RBC 5.58 (*)    HCT 47.0 (*)    MCH 25.3 (*)    All other components within normal limits  COMPREHENSIVE METABOLIC PANEL - Abnormal; Notable for the following components:   Chloride 116 (*)    Glucose, Bld 105 (*)    Creatinine, Ser 1.04 (*)    GFR calc non Af Amer 58 (*)    Anion gap 3 (*)    All other components  within normal limits  RAPID URINE DRUG SCREEN, HOSP PERFORMED - Abnormal; Notable for the following components:   Cocaine POSITIVE (*)    All other components within normal limits  URINALYSIS, ROUTINE W REFLEX MICROSCOPIC - Abnormal; Notable for the following components:   APPearance CLOUDY (*)    Hgb urine dipstick SMALL (*)    All other components within normal limits  PROTIME-INR  APTT  DIFFERENTIAL  CBG MONITORING, ED  I-STAT CREATININE, ED    EKG EKG Interpretation  Date/Time:  Tuesday February 03 2019 11:42:13 EDT Ventricular Rate:  63 PR Interval:    QRS Duration: 92 QT Interval:  399 QTC Calculation: 409 R Axis:     Text Interpretation:  Sinus rhythm Low voltage, precordial leads Probable anteroseptal infarct, old Baseline wander Artifact Abnormal ekg Confirmed by Gerhard Munch [phone]) on 02/03/2019 12:17:26 PM Also confirmed by Gerhard Munch ([id number]), editor Barbette Hair [phone])  on 02/03/2019 12:40:26 PM   Radiology Ct Head Wo Contrast  Result Date: 02/03/2019 CLINICAL DATA:  Acute headache.  Two falls at home. EXAM: CT HEAD WITHOUT CONTRAST TECHNIQUE: Contiguous axial images were obtained from the base of the skull through the vertex without intravenous contrast. COMPARISON:  MR brain dated February 14, 2012. CT head dated October 26, 2011. FINDINGS: Brain: No evidence of acute infarction, hemorrhage, hydrocephalus, extra-axial collection or mass lesion/mass effect. Extensive right cerebral hemispheric encephalomalacia related to old large MCA and PCA territory infarct, with prominent ex vacuo dilatation of the right lateral ventricle. Progressive moderate to severe chronic microvascular ischemic changes. Vascular: No hyperdense vessel or unexpected calcification. Skull: Normal. Negative for fracture or focal lesion. Sinuses/Orbits: No acute finding. Other: None. IMPRESSION: 1.  No acute intracranial abnormality. 2. Extensive right cerebral hemispheric encephalomalacia related  to old large MCA and PCA territory infarcts. Electronically Signed   By: Obie Dredge M.D.   On: 02/03/2019 13:06    Procedures Procedures (including critical care time)  Medications Ordered in ED Medications  sodium chloride 0.9 % bolus 500 mL (0 mLs Intravenous Stopped 02/03/19 1431)    Followed by  0.9 %  sodium chloride infusion (100 mL/hr Intravenous New Bag/Given 02/03/19 1431)  acetaminophen (TYLENOL) suppository 650 mg (650 mg Rectal Not Given 02/03/19 1431)  acetaminophen (TYLENOL) tablet 1,000 mg (1,000 mg Oral Given 02/03/19 1328)     Initial Impression / Assessment and Plan / ED Course  I have reviewed the triage vital signs and  the nursing notes.  Pertinent labs & imaging results that were available during my care of the patient were reviewed by me and considered in my medical decision making (see chart for details).  Clinical Course as of Feb 04 1927  Tue Feb 03, 2019  1654 Patient's MRI shows new areas of ischemic stroke.  Discussed with Dr. Jerrell Belfast from neurology who recommends the patient be admitted and they will consult.   [MB]    Clinical Course User Index [MB] Terrilee Files, MD       3:11 PM CT unremarkable, initial labs notable for +cocaine on UDS.  This elderly female with a history of prior strokes presents with worsening weakness, and several falls today. Here patient is awake and alert, has baseline, seemingly, neurologic deficiencies, with left arm contracture, left lower extremity weakness, facial asymmetry. Patient is afebrile, awake and alert Though she complained of new falls, she had no cutaneous notable findings, ecchymosis or otherwise. No evidence for new fracture, no deformities, no complaints of physical pain.  Initial labs reassuring, with concern for her new dizziness, CT, MRI planned. Initial CT reassuring, labs reassuring, some suspicion for deconditioning versus vertigo versus cocaine related changes. On signout, the patient is  awaiting MRI imaging. Dr. Charm Barges is aware of her course thus far, will disposition as appropriate.  7:29 PM (02/04/19) chart review notable for MRI positive for acute/subacute stroke, patient admitted for further monitoring, management.   Final Clinical Impressions(s) / ED Diagnoses  Fall, initial encounter Dizziness   Gerhard Munch, MD 02/03/19 1513    Gerhard Munch, MD 02/04/19 1929

## 2019-02-03 NOTE — ED Notes (Signed)
Patient transported to CT 

## 2019-02-03 NOTE — Consult Note (Addendum)
Neurology Consultation  Reason for Consult: Stroke Referring Physician: Jeraldine Loots  History is obtained from: Chart  HPI: [name] is a 63 y.o. female previous right MCA stroke, hypertension, bipolar disorder, schizophrenia, tobacco abuse, alcoholism, congestive heart failure, documented history of pulmonary emboli in notes from 2012, blood transfusion without reported diagnosis and polysubstance abuse.  Patient presented to the emergency department with concern for worsening weakness on the left along with frequent falls.  Unclear last known normal. Patient does have a history of prior strokes with residual left-sided weakness and painful contracture of left arm.  Noncontrast CT of the head showed prior area of encephalomalacia from the large right MCA stroke and no acute changes.  An MRI was pursued. MRI show new subacute/acute stroke in the right MCA territory adjacent to the old strokes.   Upon initial interviewing in the emergency room and prior imaging she did not admit to cocaine use but when her urinary toxicology screen came positive and she was questioned again, patient admitted she was doing cocaine.  She could not tell me over the last time she used cocaine.   Last stroke imaging in our system is from 2012.  LKW: Unknown tpa given?: no, unknown last known normal Premorbid modified Rankin scale (mRS): 4  ROS: ROS was performed and is negative except as noted in the HPI.   Past Medical History:  Diagnosis Date  . Blood transfusion without reported diagnosis   . CHF (congestive heart failure) (HCC)   . Hypertension   . Stroke [facility])     Family History  Problem Relation Age of Onset  . Hypertension Mother   . Hypertension Father    Social History:   reports that she has been smoking. She has never used smokeless tobacco. She reports that she does not drink alcohol or use drugs.  Medications  Current Facility-Administered Medications:  .  [COMPLETED] sodium chloride  0.9 % bolus 500 mL, 500 mL, Intravenous, Once, Stopped at 02/03/19 1431 **FOLLOWED BY** 0.9 %  sodium chloride infusion, 100 mL/hr, Intravenous, Continuous, Gerhard Munch, MD, Last Rate: 100 mL/hr at 02/03/19 1431, 100 mL/hr at 02/03/19 1431 .  acetaminophen (TYLENOL) suppository 650 mg, 650 mg, Rectal, Once, Gerhard Munch, MD No current outpatient medications on file.   Exam: Current vital signs: BP (!) 164/87   Pulse (!) 59   Temp 98.6 F (37 C) (Oral)   Resp 17   Ht  (1.575 m)   Wt 61.2 kg   SpO2 98%   BMI 24.69 kg/m  Vital signs in last 24 hours: Temp:  [98.6 F (37 C)] 98.6 F (37 C) (03/24 1140) Pulse Rate:  [59-71] 59 (03/24 1615) Resp:  [12-19] 17 (03/24 1615) BP: (139-191)/(56-151) 164/87 (03/24 1615) SpO2:  [97 %-100 %] 98 % (03/24 1615) Weight:  [61.2 kg] 61.2 kg (03/24 1141)  Physical Exam  Constitutional: Does not appear well-nourished.  Psych: Abulic Eyes: No scleral injection HENT: No OP obstrucion Head: Normocephalic.  Cardiovascular: Normal rate and regular rhythm.  Respiratory: Effort normal, non-labored breathing GI: Soft.  No distension. There is no tenderness.  Skin: WDI  Neuro: Mental Status: Patient is awake, alert, oriented to person, place, month,. But not able to provide good history from her prior strokes or what brought her to the hospital today. No signs of aphasia or neglect but does have a mild dysarthria Cranial Nerves: II: Left homonymous hemianopsia.  III,IV, VI: EOMI without ptosis or diploplia. Pupils are equal, round, and reactive  to light.   V: Facial sensation is symmetric to temperature VII: Left facial droop.  VIII: hearing is intact to voice X: Uvula elevates symmetrically XI: Shoulder shrug is symmetric. XII: tongue is midline without atrophy or fasciculations.  Motor: Contracture of left arm, 4/5 strength in the left leg without vertical drift, 5/5 strength on right arm and leg Sensory: Sensation is  symmetric to light touch and temperature in the arms and legs. Deep Tendon Reflexes: 2+ and symmetric in the biceps and patellae.  Plantars: Upgoing on the right and mute on the left Cerebellar: Finger-to-nose was normal on the right  NIH stroke scale 1a Level of Conscious.:  0 1b LOC Questions: 0  1c LOC Commands: 0 2 Best Gaze: 0 3 Visual: 2 4 Facial Palsy: 2 5a Motor Arm - left: 4 5b Motor Arm - Right: 0 6a Motor Leg - Left: 0 6b Motor Leg - Right: 0 7 Limb Ataxia: 0 8 Sensory: 0 9 Best Language: 0 10 Dysarthria: 1 11 Extinct. and Inatten.:  TOTAL: 9  Labs I have reviewed labs in epic and the results pertinent to this consultation are:  CBC    Component Value Date/Time   WBC 4.9 02/03/2019 1205   RBC 5.58 (H) 02/03/2019 1205   HGB 14.1 02/03/2019 1205   HCT 47.0 (H) 02/03/2019 1205   PLT 170 02/03/2019 1205   MCV 84.2 02/03/2019 1205   MCH 25.3 (L) 02/03/2019 1205   MCHC 30.0 02/03/2019 1205   RDW 14.9 02/03/2019 1205   LYMPHSABS 1.3 02/03/2019 1205   MONOABS 0.4 02/03/2019 1205   EOSABS 0.0 02/03/2019 1205   BASOSABS 0.0 02/03/2019 1205    CMP     Component Value Date/Time   NA 141 02/03/2019 1205   K 3.7 02/03/2019 1205   CL 116 (H) 02/03/2019 1205   CO2 22 02/03/2019 1205   GLUCOSE 105 (H) 02/03/2019 1205   BUN 11 02/03/2019 1205   CREATININE 1.00 02/03/2019 1232   CALCIUM 9.0 02/03/2019 1205   PROT 7.0 02/03/2019 1205   ALBUMIN 4.0 02/03/2019 1205   AST 19 02/03/2019 1205   ALT 10 02/03/2019 1205   ALKPHOS 63 02/03/2019 1205   BILITOT 0.5 02/03/2019 1205   GFRNONAA 58 (L) 02/03/2019 1205   GFRAA >60 02/03/2019 1205  Urinary toxicology screen positive for cocaine  Imaging I have reviewed the images obtained:  CT-scan of the brain-no acute intracranial abnormality, extensive right cerebral hemispheric encephalomalacia related to old large MCA and PCA territory infarcts  MRI examination of the brain- acute on chronic right cerebral infarct,  nonhemorrhagic, right cerebral hemisphere.  Acute nonhemorrhagic infarct involves the basal ganglia, residual posterior temporal and occipital lobe, as well as regional white matter.  It is noted that there is absent flow in the skull base right internal carotid artery.  While recent right ICA thrombosis may have been the contributory factor to the acute infarction.  It also shows extensive chronic microvascular ischemic changes likely from hypertension  Felicie Morn PA-C [facility] [phone]  M-F  (9:00 am- 5:00 PM)  02/03/2019, 5:25 PM    Attending addendum Patient seen and examined.  I have personally reviewed imaging. Came in for worsening left-sided weakness and increasing frequency of falls.  At baseline has left-sided arm contracture and left hemiparesis. Noncontrast CT of the head unremarkable for acute process-reviewed by me. MRI of the brain done shows multifocal infarcts in the right cerebral hemisphere. Stroke-likely secondary to vasospasm versus small vessel disease  See formal assessment and plan below.  Assessment:  63 year old woman with an extensive past medical history including that of a right MCA stroke with residual left-sided hemiparesis presenting for worsening of her symptoms and on an MRI noted to have multiple areas of restricted diffusion in the right cerebral hemisphere adjacent to the prior stroke. Admits to recent cocaine use, could not really specify when though-urinary toxicology screen positive for cocaine. Stroke likely small vessel etiology versus vasospasm from cocaine. Rule out cardioembolic etiology for the stroke.  Impression: Acute ischemic stroke-likely vasospasm from cocaine abuse versus small vessel disease Evaluate for cardioembolic etiology for strokes Baseline left arm contracture and left leg weakness from prior right MCA stroke  Recommendations: -Admit for stroke work-up -Telemetry -Might need outpatient cardiac  monitoring -CTA of head and neck - Hemoglobin A1c, LDL - PT/OT -Frequent neurochecks -2D echocardiogram -Aspirin 325 -Atorvastatin 80  Stroke team will continue to follow with you.    -- Milon Dikes, MD [facility] Pager: [phone] If 7pm to 7am, please call on call as listed on AMION.

## 2019-02-03 NOTE — ED Notes (Signed)
Patient transported to MRI 

## 2019-02-03 NOTE — ED Notes (Signed)
Pt changed of linen and pads.

## 2019-02-03 NOTE — ED Notes (Signed)
ED TO INPATIENT HANDOFF REPORT  ED Nurse Name and Phone #: Aldean JewettMillie [phone]  S Name/Age/Gender [name] M Lesure 63 y.o. female Room/Bed: 020C/020C  Code Status   Code Status: Not on file  Home/SNF/Other Home Patient oriented to: self, place, time and situation Is this baseline? Yes   Triage Complete: Triage complete  Chief Complaint Fall  Triage Note [facility]- pt from home. Pt has a fall x2 at home. Pt lives in the apartment with family and they witnessed the falls. Pt has a history of stroke in the past with left sided deficits. No LOC. Pt not complaining of any pain but only a headache.    BP 170/90 HR 88 RR 16 Temp 99.8    Allergies No Known Allergies  Level of Care/Admitting Diagnosis ED Disposition    ED Disposition Condition Comment   Admit  [facility] [[id number]]  Level of Care: Telemetry Medical [104]  Diagnosis: CVA (cerebral vascular accident) [facility](HCC) [[id number]][[id number]]  Admitting Physician: Anne ShutterAINES, ALEXANDER N [[id number]]  Attending Physician: Anne ShutterAINES, ALEXANDER N [[id number]]  Estimated length of stay: past midnight tomorrow  Certification:: I certify this patient will need inpatient services for at least 2 midnights  PT Class (Do Not Modify): Inpatient [101]  PT Acc Code (Do Not Modify): Private [1]       B Medical/Surgery History Past Medical History:  Diagnosis Date  . Blood transfusion without reported diagnosis   . CHF (congestive heart failure) (HCC)   . Hypertension   . Stroke [facility](HCC)    Past Surgical History:  Procedure Laterality Date  . ABDOMINAL SURGERY       A IV Location/Drains/Wounds Patient Lines/Drains/Airways Status   Active Line/Drains/Airways    Name:   Placement date:   Placement time:   Site:   Days:   Peripheral IV 02/03/19 Right Antecubital   02/03/19    1228    Antecubital   less than 1   Peripheral IV   -    -    -      External Urinary Catheter   02/03/19    1432    -   less than 1           Intake/Output Last 24 hours  Intake/Output Summary (Last 24 hours) at 02/03/2019 1712 Last data filed at 02/03/2019 1431 Gross per 24 hour  Intake 499.46 ml  Output -  Net 499.46 ml    Labs/Imaging Results for orders placed or performed during the hospital encounter of 02/03/19 (from the past 48 hour(s))  CBG monitoring, ED     Status: None   Collection Time: 02/03/19 11:43 AM  Result Value Ref Range   Glucose-Capillary 91 70 - 99 mg/dL  Protime-INR     Status: None   Collection Time: 02/03/19 12:05 PM  Result Value Ref Range   Prothrombin Time 13.5 11.4 - 15.2 seconds   INR 1.0 0.8 - 1.2    Comment: (NOTE) INR goal varies based on device and disease states. Performed at [facility], [address] [place], [place] [address]   APTT     Status: None   Collection Time: 02/03/19 12:05 PM  Result Value Ref Range   aPTT 28 24 - 36 seconds    Comment: Performed at [facility], [address] [place], [place] [address]  CBC     Status: Abnormal   Collection Time: 02/03/19 12:05 PM  Result Value Ref Range   WBC 4.9  4.0 - 10.5 K/uL   RBC 5.58 (H) 3.87 - 5.11 MIL/uL   Hemoglobin 14.1 12.0 - 15.0 g/dL   HCT 30.0 (H) 92.3 - 30.0 %   MCV 84.2 80.0 - 100.0 fL   MCH 25.3 (L) 26.0 - 34.0 pg   MCHC 30.0 30.0 - 36.0 g/dL   RDW 76.2 26.3 - 33.5 %   Platelets 170 150 - 400 K/uL   nRBC 0.0 0.0 - 0.2 %    Comment: Performed at [facility], [address] [place], [place] [address]  Differential     Status: None   Collection Time: 02/03/19 12:05 PM  Result Value Ref Range   Neutrophils Relative % 66 %   Neutro Abs 3.2 1.7 - 7.7 K/uL   Lymphocytes Relative 26 %   Lymphs Abs 1.3 0.7 - 4.0 K/uL   Monocytes Relative 7 %   Monocytes Absolute 0.4 0.1 - 1.0 K/uL   Eosinophils Relative 1 %   Eosinophils Absolute 0.0 0.0 - 0.5 K/uL   Basophils Relative 0 %   Basophils Absolute 0.0 0.0 - 0.1 K/uL   Immature Granulocytes 0 %   Abs Immature Granulocytes 0.01 0.00 -  0.07 K/uL    Comment: Performed at [facility], [address], [place], [place] [address]  Comprehensive metabolic panel     Status: Abnormal   Collection Time: 02/03/19 12:05 PM  Result Value Ref Range   Sodium 141 135 - 145 mmol/L   Potassium 3.7 3.5 - 5.1 mmol/L   Chloride 116 (H) 98 - 111 mmol/L   CO2 22 22 - 32 mmol/L   Glucose, Bld 105 (H) 70 - 99 mg/dL   BUN 11 8 - 23 mg/dL   Creatinine, Ser 7.34 (H) 0.44 - 1.00 mg/dL   Calcium 9.0 8.9 - 28.7 mg/dL   Total Protein 7.0 6.5 - 8.1 g/dL   Albumin 4.0 3.5 - 5.0 g/dL   AST 19 15 - 41 U/L   ALT 10 0 - 44 U/L   Alkaline Phosphatase 63 38 - 126 U/L   Total Bilirubin 0.5 0.3 - 1.2 mg/dL   GFR calc non Af Amer 58 (L) >60 mL/min   GFR calc Af Amer >60 >60 mL/min   Anion gap 3 (L) 5 - 15    Comment: Performed at [facility], [address], [place], [place] [address]  I-stat Creatinine, ED     Status: None   Collection Time: 02/03/19 12:32 PM  Result Value Ref Range   Creatinine, Ser 1.00 0.44 - 1.00 mg/dL  Urine rapid drug screen (hosp performed)not at [facility]     Status: Abnormal   Collection Time: 02/03/19  1:26 PM  Result Value Ref Range   Opiates NONE DETECTED NONE DETECTED   Cocaine POSITIVE (A) NONE DETECTED   Benzodiazepines NONE DETECTED NONE DETECTED   Amphetamines NONE DETECTED NONE DETECTED   Tetrahydrocannabinol NONE DETECTED NONE DETECTED   Barbiturates NONE DETECTED NONE DETECTED    Comment: (NOTE) DRUG SCREEN FOR MEDICAL PURPOSES ONLY.  IF CONFIRMATION IS NEEDED FOR ANY PURPOSE, NOTIFY LAB WITHIN 5 DAYS. LOWEST DETECTABLE LIMITS FOR URINE DRUG SCREEN Drug Class                     Cutoff (ng/mL) Amphetamine and metabolites    1000 Barbiturate and metabolites    200 Benzodiazepine  200 Tricyclics and metabolites     300 Opiates and metabolites        300 Cocaine and metabolites        300 THC                            50 Performed at [facility], [address]  [place], [place] [address]   Urinalysis, Routine w reflex microscopic (not at [facility])     Status: Abnormal   Collection Time: 02/03/19  1:26 PM  Result Value Ref Range   Color, Urine YELLOW YELLOW   APPearance CLOUDY (A) CLEAR   Specific Gravity, Urine 1.014 1.005 - 1.030   pH 7.0 5.0 - 8.0   Glucose, UA NEGATIVE NEGATIVE mg/dL   Hgb urine dipstick SMALL (A) NEGATIVE   Bilirubin Urine NEGATIVE NEGATIVE   Ketones, ur NEGATIVE NEGATIVE mg/dL   Protein, ur NEGATIVE NEGATIVE mg/dL   Nitrite NEGATIVE NEGATIVE   Leukocytes,Ua NEGATIVE NEGATIVE   RBC / HPF 0-5 0 - 5 RBC/hpf   Bacteria, UA NONE SEEN NONE SEEN   Squamous Epithelial / LPF 0-5 0 - 5   Amorphous Crystal PRESENT     Comment: Performed at [facility], [address] [place], [place] [address]   Ct Head Wo Contrast  Result Date: 02/03/2019 CLINICAL DATA:  Acute headache.  Two falls at home. EXAM: CT HEAD WITHOUT CONTRAST TECHNIQUE: Contiguous axial images were obtained from the base of the skull through the vertex without intravenous contrast. COMPARISON:  MR brain dated February 14, 2012. CT head dated October 26, 2011. FINDINGS: Brain: No evidence of acute infarction, hemorrhage, hydrocephalus, extra-axial collection or mass lesion/mass effect. Extensive right cerebral hemispheric encephalomalacia related to old large MCA and PCA territory infarct, with prominent ex vacuo dilatation of the right lateral ventricle. Progressive moderate to severe chronic microvascular ischemic changes. Vascular: No hyperdense vessel or unexpected calcification. Skull: Normal. Negative for fracture or focal lesion. Sinuses/Orbits: No acute finding. Other: None. IMPRESSION: 1.  No acute intracranial abnormality. 2. Extensive right cerebral hemispheric encephalomalacia related to old large MCA and PCA territory infarcts. Electronically Signed   By: Obie Dredge M.D.   On: 02/03/2019 13:06   Mr Brain Wo Contrast  Result Date: 02/03/2019 CLINICAL DATA:   Increased falls.  Previous CVA. EXAM: MRI HEAD WITHOUT CONTRAST TECHNIQUE: Multiplanar, multiecho pulse sequences of the brain and surrounding structures were obtained without intravenous contrast. COMPARISON:  CT head earlier today.  Prior MR 02/14/2012. FINDINGS: Brain: Large area of encephalomalacia RIGHT hemisphere, marked compensatory enlargement of the ventricle, RIGHT lateral ventricle consistent with a nearly holohemispheric MCA/PCA territory insult. This appearance is stable from 2013. New area of restricted diffusion corresponding low ADC, in the RIGHT hemisphere involves the residual medial posterior temporal and occipital lobe, RIGHT thalamus, RIGHT basal ganglia and regional white matter, consistent with acute infarction. No features to suggest acute hemorrhage. Generalized atrophy. Extensive small vessel disease throughout the white matter, more pronounced on the LEFT due to more visible brain substance. Extensive chronic microvascular ischemic changes also observed in the pons. Vascular: There is absent flow void in the RIGHT petrous and cavernous ICA. It is unclear if this is acute or chronic. Supraclinoid RICA is patent, as is the RIGHT MCA, presumably collateral flow. Skull and upper cervical spine: Normal marrow signal. Sinuses/Orbits: Negative. Other: None. IMPRESSION: Acute on chronic cerebral infarction, nonhemorrhagic, RIGHT cerebral hemisphere. Acute nonhemorrhagic infarction involves  the basal ganglia, residual posterior temporal and occipital lobe, as well as regional white matter. There is absent flow in the skull base RIGHT internal carotid artery. While recent RIGHT ICA thrombosis may have been contributory to the acute infarction, there are no specific imaging features which would allow an estimate of acuity versus chronicity. Extensive chronic microvascular ischemic change, likely hypertensive related. Electronically Signed   By: Elsie Stain M.D.   On: 02/03/2019 16:21    Pending  Labs Unresulted Labs (From admission, onward)   None      Vitals/Pain Today's Vitals   02/03/19 1515 02/03/19 1521 02/03/19 1522 02/03/19 1615  BP:    (!) 164/87  Pulse: 65 67  (!) 59  Resp: Temp:      TempSrc:      SpO2: 99% 100%  98%  Weight:      Height:      PainSc:   8      Isolation Precautions No active isolations  Medications Medications  sodium chloride 0.9 % bolus 500 mL (0 mLs Intravenous Stopped 02/03/19 1431)    Followed by  0.9 %  sodium chloride infusion (100 mL/hr Intravenous New Bag/Given 02/03/19 1431)  acetaminophen (TYLENOL) suppository 650 mg (650 mg Rectal Not Given 02/03/19 1431)  acetaminophen (TYLENOL) tablet 1,000 mg (1,000 mg Oral Given 02/03/19 1328)    Mobility walks with device High fall risk   Focused Assessments Neuro Assessment Handoff:  Swallow screen pass? Yes  Cardiac Rhythm: Sinus bradycardia NIH Stroke Scale ( + Modified Stroke Scale Criteria)  Interval: Initial Level of Consciousness (1a.)   : Alert, keenly responsive LOC Questions (1b. )   +: Answers both questions correctly LOC Commands (1c. )   + : Performs both tasks correctly Best Gaze (2. )  +: Normal Visual (3. )  +: No visual loss Facial Palsy (4. )    : Partial paralysis (Pt stats norm from old CVA) Motor Arm, Left (5a. )   +: No movement Motor Arm, Right (5b. )   +: No drift Motor Leg, Left (6a. )   +: Some effort against gravity Motor Leg, Right (6b. )   +: No drift Limb Ataxia (7. ): Present in two limbs Sensory (8. )   +: Mild-to-moderate sensory loss, patient feels pinprick is less sharp or is dull on the affected side, or there is a loss of superficial pain with pinprick, but patient is aware of being touched Best Language (9. )   +: No aphasia Dysarthria (10. ): Mild-to-moderate dysarthria, patient slurs at least some words and, at worst, can be understood with some difficulty(Pt states speach per norm) Extinction/Inattention (11.)   +: No  Abnormality Modified SS Total  +: 7 Complete NIHSS TOTAL: 16     Neuro Assessment:   Neuro Checks:   Initial (02/03/19 1148)  Last Documented NIHSS Modified Score: 7 (02/03/19 1615) Has TPA been given? No If patient is a Neuro Trauma and patient is going to OR before floor call report to 4N Charge nurse: [phone] or [phone]     R Recommendations: See Admitting Provider Note  Report given to:   Additional Notes: hx of falls x 2 at home

## 2019-02-03 NOTE — ED Notes (Signed)
Linen and pt changed again for incontinence.

## 2019-02-03 NOTE — H&P (Addendum)
Date: 02/03/2019               Patient Name:  [name] MRN: [id number]  DOB: 10-15-1956 Age / Sex: 63 y.o., female   PCP: Patient, No Pcp Per         Medical Service: Internal Medicine Teaching Service         Attending Physician: Dr. Sandre Kitty, Elwin Mocha, MD    First Contact: Dr. Karilyn Cota Pager: [phone]  Second Contact: Dr. 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 She states she was in her usual state of health until yesterday when she had a headache and kept falling afterwards. She states she did not lose consciousness when she fell. She fell on her left side but did not hit her head. She denies feeling lightheaded, dizzy, chest pain or palpitations, changes in her vision, changes in her diet or medications. She denies any prodromal symptoms prior to her fall. She has residual weakness in her left leg from a prior stroke that she states is worse. She states she has been taking all her medications as prescribed. She lives at home with her boyfriend who helps her with her ADL's. She manages getting around with the use of a cane. She is able to feed herself but needs assistance bathing herself. She most recently used cocaine 3 days ago. States she snorts cocaine about every 3 days.   ED course: On arrival, she was hypertensive 191/139. UDS was positive for cocaine. CT head showed no acute intracranial abnormality, and extensive right cerebral hemispheric encephalomalacia related to old large MCA and PCA territory infarcts. MRI showed acute on chronic cerebral infarction, nonhemorrhagic, right cerebral hemisphere.   Meds:  No medications  on file but patient states she takes anti-hypertensive's and a blood thinner.  No outpatient medications have been marked as taking for the 02/03/19 encounter [facility] Encounter).    Allergies: Allergies as of 02/03/2019  . (No Known Allergies)   Past Medical History:  Diagnosis Date  . Blood transfusion without reported diagnosis   . CHF (congestive heart failure) (HCC)   . Hypertension   . Stroke [facility])     Family History:  Family History  Problem Relation Age of Onset  . Hypertension Mother   . Hypertension Father     Social History:  Patient states she lives with her boyfriend. She gets around with the use of a cane. She is able to feed herself but requires assistance with bathing. She smokes 5 cigarettes a day for many years. She snorts cocaine every 3 days. Denies any alcohol use.   Social History   Socioeconomic History  . Marital status: Single    Spouse name: Not on file  . Number of children: Not on file  . Years of education: Not on file  . Highest education level: Not on file  Occupational History  . Not on file  Social Needs  . Financial resource strain: Not on file  . Food insecurity:    Worry: Not on file    Inability: Not on file  . Transportation needs:    Medical:  Not on file    Non-medical: Not on file  Tobacco Use  . Smoking status: Current Every Day Smoker  . Smokeless tobacco: Never Used  Substance and Sexual Activity  . Alcohol use: Never    Frequency: Never  . Drug use: Never  . Sexual activity: Not on file  Lifestyle  . Physical activity:    Days per week: Not on file    Minutes per session: Not on file  . Stress: Not on file  Relationships  . Social connections:    Talks on phone: Not on file    Gets together: Not on file    Attends religious service: Not on file    Active member of club or organization: Not on file    Attends meetings of clubs or organizations: Not on file    Relationship status: Not on file  . Intimate  partner violence:    Fear of current or ex partner: Not on file    Emotionally abused: Not on file    Physically abused: Not on file    Forced sexual activity: Not on file  Other Topics Concern  . Not on file  Social History Narrative  . Not on file    Review of Systems: A complete ROS was negative except as per HPI.   Physical Exam: Blood pressure (!) 143/86, pulse 66, temperature 98.6 F (37 C), temperature source Oral, resp. rate 18, height 5\' 2"  (1.575 m), weight 61.2 kg, SpO2 97 %.  Physical Exam  Constitutional: She is oriented to person, place, and time and well-developed, well-nourished, and in no distress. No distress.  HENT:  Head: Atraumatic.  Eyes: EOM are normal. Right eye exhibits discharge. Left eye exhibits discharge.  Cardiovascular: Normal rate, regular rhythm and normal heart sounds.  No murmur heard. Pulmonary/Chest: Effort normal and breath sounds normal. No respiratory distress. She has no wheezes. She has no rales.  Abdominal: Soft. Bowel sounds are normal. She exhibits no distension. There is no abdominal tenderness.  Musculoskeletal:        General: No tenderness or edema.  Neurological: She is alert and oriented to person, place, and time.  Sensation intact bilaterally, left UE paralysis, left LE 2/5, right UE 5/5, right LE 5/5, finger to nose test intact with use of right hand, CN XI deficit on left, left sided facial droop  Skin: Skin is warm and dry. She is not diaphoretic.  Psychiatric: Memory and judgment normal.  Flat affect    EKG: personally reviewed my interpretation is sinus rhythm   CXR: n/a  MRI Brain IMPRESSION: Acute on chronic cerebral infarction, nonhemorrhagic, RIGHT cerebral hemisphere. Acute nonhemorrhagic infarction involves the basal ganglia, residual posterior temporal and occipital lobe, as well as regional white matter.  There is absent flow in the skull base RIGHT internal carotid artery. While recent RIGHT ICA  thrombosis may have been contributory to the acute infarction, there are no specific imaging features which would allow an estimate of acuity versus chronicity.  Extensive chronic microvascular ischemic change, likely hypertensive Related.  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  CVA: Patient presented due to a headache and subsequent falls, found to have an acute on chronic cerebral infarction, nonhemorrhagic, right cerebral hemisphere. There is absent  flow in the skull base right internal carotid artery. She had a prior right MCA stroke in 2012. She states she is on anti-hypertensives and a blood thinner however no records per chart review. She also uses cocaine every three days. Acute ischemic stroke likely from vasospasm from cocaine use.  - Neurology on board, appreciate recommendations. - Telemetry - CTA head and neck - Hgb A1c and Lipid panel - PT/OT - Frequent neuro checks - Stroke swallow screen - Echo - Aspirin 325 mg  - Atorvastatin 80 mg   HTN: Patient reports a history of HTN but no medications per chart review. Will monitor   Cocaine Use: Patient initially denied any illicit drug use. However, when asked about her UDS she admitted she snorted cocaine 3 days ago and uses about every 3 days. She is interested in quitting. Will get social work on board to help with resources.  Diet: NPO VTE ppx: Lovenox Full Code  Dispo: Admit patient to Inpatient with expected length of stay greater than 2 midnights.  SignedJaci Standard, DO 02/03/2019, 5:56 PM  Pager: [phone]

## 2019-02-03 NOTE — ED Notes (Signed)
Pt stats headache unchanged after medication.

## 2019-02-03 NOTE — ED Notes (Signed)
IV attempted x2 without success.

## 2019-02-04 ENCOUNTER — Telehealth (HOSPITAL_COMMUNITY): Payer: Self-pay | Admitting: Speech Pathology

## 2019-02-04 ENCOUNTER — Inpatient Hospital Stay (HOSPITAL_COMMUNITY): Payer: Medicare Other

## 2019-02-04 DIAGNOSIS — F319 Bipolar disorder, unspecified: Secondary | ICD-10-CM

## 2019-02-04 DIAGNOSIS — F209 Schizophrenia, unspecified: Secondary | ICD-10-CM

## 2019-02-04 DIAGNOSIS — Z7982 Long term (current) use of aspirin: Secondary | ICD-10-CM

## 2019-02-04 DIAGNOSIS — I69354 Hemiplegia and hemiparesis following cerebral infarction affecting left non-dominant side: Secondary | ICD-10-CM

## 2019-02-04 DIAGNOSIS — Z79899 Other long term (current) drug therapy: Secondary | ICD-10-CM

## 2019-02-04 DIAGNOSIS — F1410 Cocaine abuse, uncomplicated: Secondary | ICD-10-CM | POA: Insufficient documentation

## 2019-02-04 DIAGNOSIS — I10 Essential (primary) hypertension: Secondary | ICD-10-CM

## 2019-02-04 DIAGNOSIS — Z9181 History of falling: Secondary | ICD-10-CM

## 2019-02-04 DIAGNOSIS — I63231 Cerebral infarction due to unspecified occlusion or stenosis of right carotid arteries: Secondary | ICD-10-CM

## 2019-02-04 DIAGNOSIS — Z8673 Personal history of transient ischemic attack (TIA), and cerebral infarction without residual deficits: Secondary | ICD-10-CM

## 2019-02-04 DIAGNOSIS — I6359 Cerebral infarction due to unspecified occlusion or stenosis of other cerebral artery: Secondary | ICD-10-CM

## 2019-02-04 DIAGNOSIS — I6521 Occlusion and stenosis of right carotid artery: Secondary | ICD-10-CM

## 2019-02-04 DIAGNOSIS — I69392 Facial weakness following cerebral infarction: Secondary | ICD-10-CM

## 2019-02-04 DIAGNOSIS — I639 Cerebral infarction, unspecified: Secondary | ICD-10-CM

## 2019-02-04 DIAGNOSIS — Z86711 Personal history of pulmonary embolism: Secondary | ICD-10-CM

## 2019-02-04 LAB — ECHOCARDIOGRAM COMPLETE
Height: 62 in
Weight: 2160 oz

## 2019-02-04 LAB — LIPID PANEL
Cholesterol: 168 mg/dL (ref 0–200)
HDL: 55 mg/dL (ref >40)
LDL Cholesterol: 92 mg/dL (ref 0–99)
Total CHOL/HDL Ratio: 3.1 RATIO
Triglycerides: 103 mg/dL (ref <150)
VLDL: 21 mg/dL (ref 0–40)

## 2019-02-04 LAB — HEMOGLOBIN A1C
Hgb A1c MFr Bld: 5.8 % — ABNORMAL HIGH (ref 4.8–5.6)
Mean Plasma Glucose: 119.76 mg/dL

## 2019-02-04 LAB — HIV ANTIBODY (ROUTINE TESTING W REFLEX): HIV Screen 4th Generation wRfx: NONREACTIVE

## 2019-02-04 MED ORDER — RISPERIDONE 0.5 MG PO TABS
1.0000 mg | ORAL_TABLET | Freq: Every day | ORAL | Status: DC
  Administered 2019-02-04 – 2019-02-05 (×2): 1 mg via ORAL
  Filled 2019-02-04 (×3): qty 2

## 2019-02-04 MED ORDER — PANTOPRAZOLE SODIUM 40 MG PO TBEC
40.0000 mg | DELAYED_RELEASE_TABLET | Freq: Every day | ORAL | Status: DC
  Administered 2019-02-04 – 2019-02-06 (×3): 40 mg via ORAL
  Filled 2019-02-04 (×3): qty 1

## 2019-02-04 MED ORDER — PREGABALIN 75 MG PO CAPS
75.0000 mg | ORAL_CAPSULE | Freq: Every day | ORAL | Status: DC
  Administered 2019-02-04 – 2019-02-06 (×3): 75 mg via ORAL
  Filled 2019-02-04 (×3): qty 1

## 2019-02-04 NOTE — Evaluation (Signed)
Speech Language Pathology Evaluation Patient Details Name: [name] MRN: [id number] DOB: 1956-03-09 Today's Date: 02/04/2019 Time: 8478-4128 SLP Time Calculation (min) (ACUTE ONLY): 25 min  Problem List:  Patient Active Problem List   Diagnosis Date Noted  . CVA (cerebral vascular accident) (HCC) 02/03/2019   Past Medical History:  Past Medical History:  Diagnosis Date  . Blood transfusion without reported diagnosis   . CHF (congestive heart failure) (HCC)   . Hypertension   . Stroke [facility])    Past Surgical History:  Past Surgical History:  Procedure Laterality Date  . ABDOMINAL SURGERY     HPI:  Patient is a 63 y.o. female with PMH; HTN, h/o PE, schizophrenia, bipolar disorder, prior CVA in 2012 with residual left arm paralysis and left leg weakness, cocaine abuse who presented to ED secondary to headache and multiple falls at home but without loss of consciousness per patient report. Her boyfriend has been assisting with her ADL's since CVA in 2012. Upon arrival to ED, patient was hypertensive and UDS was positive for cocaine, CT head revealed acute intracranial abnormality and extensive right cerebral hemispheric encephalomalacia related to old MCA and PCA territory infarcts. MRI showed acute on chronic cerebral infarction, nonhemorrhagic in right cerebral hemisphere.    Assessment / Plan / Recommendation Clinical Impression  Patient presents with a mild-mod flaccid dysarthria and a moderate cognitive-linguistic deficit which appears to be an exacerbation of premorbid cognitive, linguistic and speech deficits (from CVA in 2012). SLP was able to contact patient's long term boyfriend (they live together and he helps with her ADL's) and he feels that patient has been declining in past two years and he has to help her/assist more with ADL's but also acutely, he feels that her speech, language and cognition are mildly different from how she was prior to this admission. (based on [place]  comparing patient's recent function at home to functional limitations that this SLP described to him). Patient exhibitied inattention to left visual field, decreased awareness to functional limitations, decreased attention, impaired affect with monotone vocal quality and flat affect, pragmatic language as she would not spontaneously comment or ask questions and would not elaborate on her responses, ie: she said "my head hurts" but it wasn't until SLP asked her follow up quetions did she indicate level of pain and whether she needed medication or RN, etc. Patient was oriented x4 and able to state reason for being in hospital as "I had a stroke.    SLP Assessment  SLP Recommendation/Assessment: Patient needs continued Speech Lanaguage Pathology Services SLP Visit Diagnosis: Cognitive communication deficit (R41.841);Dysarthria and anarthria (R47.1)    Follow Up Recommendations  24 hour supervision/assistance;Home health SLP;Inpatient Rehab;Skilled Nursing facility(TBD: CIR vs SNF vs HH)    Frequency and Duration min 2x/week  1 week      SLP Evaluation Cognition  Overall Cognitive Status: Impaired/Different from baseline Arousal/Alertness: Awake/alert Orientation Level: Oriented X4 Attention: Focused Focused Attention: Impaired Focused Attention Impairment: Verbal basic;Functional basic Memory: Appears intact Awareness: Impaired Awareness Impairment: Emergent impairment Problem Solving: Impaired Problem Solving Impairment: Functional basic;Verbal basic Executive Function: Self Monitoring Self Monitoring: Impaired Self Monitoring Impairment: Verbal basic;Functional basic Safety/Judgment: Impaired       Comprehension  Auditory Comprehension Overall Auditory Comprehension: Appears within functional limits for tasks assessed    Expression Expression Primary Mode of Expression: Verbal Verbal Expression Overall Verbal Expression: Impaired Initiation: Impaired Level of  Generative/Spontaneous Verbalization: Phrase;Word Repetition: No impairment Naming: No impairment Pragmatics: Impairment Impairments: Abnormal affect;Monotone;Eye contact  Interfering Components: Attention Effective Techniques: Open ended questions Non-Verbal Means of Communication: Not applicable Written Expression Written Expression: Not tested   Oral / Motor  Oral Motor/Sensory Function Overall Oral Motor/Sensory Function: Moderate impairment Facial ROM: Reduced left Facial Symmetry: Abnormal symmetry left Facial Strength: Reduced left Facial Sensation: Reduced left Lingual ROM: Within Functional Limits Lingual Symmetry: Within Functional Limits Lingual Strength: Reduced Lingual Sensation: Reduced Motor Speech Overall Motor Speech: Impaired Respiration: Within functional limits Phonation: Normal Resonance: Within functional limits Articulation: Impaired Level of Impairment: Phrase Intelligibility: Intelligibility reduced Word: 75-100% accurate Phrase: 75-100% accurate Sentence: 75-100% accurate Conversation: Not tested Motor Speech Errors: Not applicable Interfering Components: Premorbid status Effective Techniques: Slow rate;Increased vocal intensity   GO                   Angela Nevin, MA, CCC-SLP 02/04/19 1:39 PM

## 2019-02-04 NOTE — Progress Notes (Signed)
   Subjective: [name] reported a headache this morning. She has no other complaints this morning. All questions and concerns addressed.   Objective:  Vital signs in last 24 hours: Vitals:   02/03/19 2355 02/04/19 0029 02/04/19 0330 02/04/19 0337  BP: 137/66 (!) 143/84 (!) 118/107 (!) 152/64  Pulse: 71 70 70   Resp: 18 18 18    Temp: 98.4 F (36.9 C) 98 F (36.7 C) 98.5 F (36.9 C)   TempSrc: Oral Oral Oral   SpO2: 98% 100% 97%   Weight:      Height:       Physical Exam Gen: seen laying in bed, no distress CV: RRR, no murmurs Neuro: left sided facial droop, left UE paralysis, right UE strength 5/5, right LE strength 4/5, left LE strength 2/5, sensation intact bilaterally Ext: no edema, warm and well perfused   Assessment/Plan:  Active Problems:   CVA (cerebral vascular accident) (HCC)  [name] is a 63 yo female with HTN, hx of PE, schizophrenia, bipolar disorder, 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  CVA: Patient presented due to a headache and subsequent falls, found to have an acute on chronic cerebral infarction, nonhemorrhagic, right cerebral hemisphere. CTA head and neck showed that the right internal carotid artery central filling defect within the ICA bulb, appears to be an acute thrombotic or embolic occlusion. Discussed with neurology, suspect this is chronic. Not recommending any interventions at this point and will medically manage. 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 - Neurology on board, appreciate recommendations. - Telemetry - Hgb A1c 5.8  - Lipid panel wnl - PT/OT - Frequent neuro checks - Echo - Aspirin 325 mg  - Atorvastatin 80 mg   HTN: Patient reports a history of HTN but no medications per chart review. Will monitor   Cocaine Use: Last snorted cocaine 3 days ago and  uses about every 3 days. She is interested in quitting. Will get social work on board to help with resources.  Dispo: Anticipated discharge pending stroke workup.  Tracy Standard, DO 02/04/2019, 6:44 AM Pager: [phone]

## 2019-02-04 NOTE — Progress Notes (Addendum)
  Echocardiogram 2D Echocardiogram limited with bubble study has been performed due to patient contact precautions.   Gerda Diss 02/04/2019, 10:21 AM

## 2019-02-04 NOTE — Progress Notes (Signed)
STROKE TEAM PROGRESS NOTE   INTERVAL HISTORY Patient sitting in chair, awake alert, orientated.  Still has right facial droop and right spastic hemiplegia.  Educated on smoking and cocaine cessation.  Vitals:   02/04/19 0330 02/04/19 0337 02/04/19 0739 02/04/19 1214  BP: (!) 118/107 (!) 152/64 (!) 133/94 (!) 156/85  Pulse: 70  72 73  Resp: Temp: 98.5 F (36.9 C)  98.6 F (37 C) 100 F (37.8 C)  TempSrc: Oral  Oral Oral  SpO2: 97%  96% 97%  Weight:      Height:        CBC:  Recent Labs  Lab 02/03/19 1205  WBC 4.9  NEUTROABS 3.2  HGB 14.1  HCT 47.0*  MCV 84.2  PLT 170    Basic Metabolic Panel:  Recent Labs  Lab 02/03/19 1205 02/03/19 1232  NA 141  --   K 3.7  --   CL 116*  --   CO2 22  --   GLUCOSE 105*  --   BUN 11  --   CREATININE 1.04* 1.00  CALCIUM 9.0  --    Lipid Panel:     Component Value Date/Time   CHOL 168 02/04/2019 0510   TRIG 103 02/04/2019 0510   HDL 55 02/04/2019 0510   CHOLHDL 3.1 02/04/2019 0510   VLDL 21 02/04/2019 0510   LDLCALC 92 02/04/2019 0510   HgbA1c:  Lab Results  Component Value Date   HGBA1C 5.8 (H) 02/04/2019   Urine Drug Screen:     Component Value Date/Time   LABOPIA NONE DETECTED 02/03/2019 1326   COCAINSCRNUR POSITIVE (A) 02/03/2019 1326   LABBENZ NONE DETECTED 02/03/2019 1326   AMPHETMU NONE DETECTED 02/03/2019 1326   THCU NONE DETECTED 02/03/2019 1326   LABBARB NONE DETECTED 02/03/2019 1326    Alcohol Level     Component Value Date/Time   ETH <11 05/28/2011 1435    IMAGING Ct Angio Head W Or Wo Contrast  Result Date: 02/03/2019 CLINICAL DATA:  Focal neurological deficit. Falling. Acute infarction on the right. EXAM: CT ANGIOGRAPHY HEAD AND NECK TECHNIQUE: Multidetector CT imaging of the head and neck was performed using the standard protocol during bolus administration of intravenous contrast. Multiplanar CT image reconstructions and MIPs were obtained to evaluate the vascular anatomy. Carotid  stenosis measurements (when applicable) are obtained utilizing NASCET criteria, using the distal internal carotid diameter as the denominator. CONTRAST:  OMNIPAQUE IOHEXOL 350 MG/ML SOLN COMPARISON:  None. FINDINGS: CT HEAD FINDINGS Brain: No visible change since earlier today. Extensive chronic ischemic change throughout the right hemisphere with atrophy, encephalomalacia, gliosis and porencephaly. The acute infarction demonstrated in the right thalamus/basal ganglia and parieto-occipital brain can not be specifically appreciated on the CT. Widespread changes of chronic small vessel disease again evident throughout the hemispheric white matter. Chronic small-vessel ischemic changes throughout the pons. Vascular: Negative Skull: Negative Sinuses: Clear Orbits: Negative Review of the MIP images confirms the above findings CTA NECK FINDINGS Aortic arch: Aortic atherosclerosis. No aneurysm or dissection. Calcification at the brachiocephalic vessel origins but no flow limiting stenosis. Right carotid system: Common carotid artery widely patent to the bifurcation. Acute appearing occlusion of the right internal carotid artery with what appears to be luminal clock with its crescent of surrounding contrast. I do not definitely establish antegrade flow extending to the skull base and brain. Left carotid system: Common carotid artery is widely patent to the bifurcation. There is calcification in the ICA bulb, but  without stenosis compared to the more distal cervical ICA. Vertebral arteries: Both vertebral artery origins are widely patent. Skeleton: Ordinary cervical spondylosis. Other neck: No mass or lymphadenopathy. Upper chest: Scarring in the upper lobes. Review of the MIP images confirms the above findings CTA HEAD FINDINGS Anterior circulation: Left internal carotid artery is patent through the skull base and siphon region. There is some atherosclerotic calcification in the carotid siphon but no stenosis greater  than 30%. No antegrade flow in the right carotid siphon. Left internal carotid artery supplies the left middle cerebral artery territory, both anterior cerebral artery territories in the right middle cerebral artery territory through patent communicating arteries. 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 Posterior circulation: Both vertebral arteries are patent to the basilar. No basilar stenosis. Posterior circulation branch vessels are patent. Patent posterior communicating artery on the left. Venous sinuses: Patent and normal. Anatomic variants: None other significant. Delayed phase: No abnormal enhancement. Review of the MIP images confirms the above findings IMPRESSION: I believe that the right internal carotid artery is likely to be acute based on the appearance at CTA. There is a central filling defect within the ICA bulb with some surrounding contrast, and complete occlusion of the mid to distal cervical ICA. This looks like an acute thrombotic or embolic occlusion. There is no antegrade flow in the right carotid siphon. Atherosclerotic calcification at the left ICA bulb but without stenosis. Left ICA patent through the skull base and siphon region, supplying the anterior circulation on both sides. 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 This has enlarged since 2012. Specialist evaluation of this would be warranted at some point. Electronically Signed   By: Paulina Fusi M.D.   On: 02/03/2019 18:36   Ct Head Wo Contrast  Result Date: 02/03/2019 CLINICAL DATA:  Acute headache.  Two falls at home. EXAM: CT HEAD WITHOUT CONTRAST TECHNIQUE: Contiguous axial images were obtained from the base of the skull through the vertex without intravenous contrast. COMPARISON:  MR brain dated February 14, 2012. CT head dated October 26, 2011. FINDINGS: Brain: No evidence of acute  infarction, hemorrhage, hydrocephalus, extra-axial collection or mass lesion/mass effect. Extensive right cerebral hemispheric encephalomalacia related to old large MCA and PCA territory infarct, with prominent ex vacuo dilatation of the right lateral ventricle. Progressive moderate to severe chronic microvascular ischemic changes. Vascular: No hyperdense vessel or unexpected calcification. Skull: Normal. Negative for fracture or focal lesion. Sinuses/Orbits: No acute finding. Other: None. IMPRESSION: 1.  No acute intracranial abnormality. 2. Extensive right cerebral hemispheric encephalomalacia related to old large MCA and PCA territory infarcts. Electronically Signed   By: Obie Dredge M.D.   On: 02/03/2019 13:06   Ct Angio Neck W Or Wo Contrast  Result Date: 02/03/2019 CLINICAL DATA:  Focal neurological deficit. Falling. Acute infarction on the right. EXAM: CT ANGIOGRAPHY HEAD AND NECK TECHNIQUE: Multidetector CT imaging of the head and neck was performed using the standard protocol during bolus administration of intravenous contrast. Multiplanar CT image reconstructions and MIPs were obtained to evaluate the vascular anatomy. Carotid stenosis measurements (when applicable) are obtained utilizing NASCET criteria, using the distal internal carotid diameter as the denominator. CONTRAST:  OMNIPAQUE IOHEXOL 350 MG/ML SOLN COMPARISON:  None. FINDINGS: CT HEAD FINDINGS Brain: No visible change since earlier today. Extensive chronic ischemic change throughout the right hemisphere with atrophy, encephalomalacia,  gliosis and porencephaly. The acute infarction demonstrated in the right thalamus/basal ganglia and parieto-occipital brain can not be specifically appreciated on the CT. Widespread changes of chronic small vessel disease again evident throughout the hemispheric white matter. Chronic small-vessel ischemic changes throughout the pons. Vascular: Negative Skull: Negative Sinuses: Clear Orbits: Negative  Review of the MIP images confirms the above findings CTA NECK FINDINGS Aortic arch: Aortic atherosclerosis. No aneurysm or dissection. Calcification at the brachiocephalic vessel origins but no flow limiting stenosis. Right carotid system: Common carotid artery widely patent to the bifurcation. Acute appearing occlusion of the right internal carotid artery with what appears to be luminal clock with its crescent of surrounding contrast. I do not definitely establish antegrade flow extending to the skull base and brain. Left carotid system: Common carotid artery is widely patent to the bifurcation. There is calcification in the ICA bulb, but without stenosis compared to the more distal cervical ICA. Vertebral arteries: Both vertebral artery origins are widely patent. Skeleton: Ordinary cervical spondylosis. Other neck: No mass or lymphadenopathy. Upper chest: Scarring in the upper lobes. Review of the MIP images confirms the above findings CTA HEAD FINDINGS Anterior circulation: Left internal carotid artery is patent through the skull base and siphon region. There is some atherosclerotic calcification in the carotid siphon but no stenosis greater than 30%. No antegrade flow in the right carotid siphon. Left internal carotid artery supplies the left middle cerebral artery territory, both anterior cerebral artery territories in the right middle cerebral artery territory through patent communicating arteries. 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 Posterior circulation: Both vertebral arteries are patent to the basilar. No basilar stenosis. Posterior circulation branch vessels are patent. Patent posterior communicating artery on the left. Venous sinuses: Patent and normal. Anatomic variants: None other significant. Delayed phase: No abnormal enhancement. Review of the MIP images confirms the above findings IMPRESSION: I believe that the right  internal carotid artery is likely to be acute based on the appearance at CTA. There is a central filling defect within the ICA bulb with some surrounding contrast, and complete occlusion of the mid to distal cervical ICA. This looks like an acute thrombotic or embolic occlusion. There is no antegrade flow in the right carotid siphon. Atherosclerotic calcification at the left ICA bulb but without stenosis. Left ICA patent through the skull base and siphon region, supplying the anterior circulation on both sides. 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 This has enlarged since 2012. Specialist evaluation of this would be warranted at some point. Electronically Signed   By: Paulina Fusi M.D.   On: 02/03/2019 18:36   Mr Brain Wo Contrast  Result Date: 02/03/2019 CLINICAL DATA:  Increased falls.  Previous CVA. EXAM: MRI HEAD WITHOUT CONTRAST TECHNIQUE: Multiplanar, multiecho pulse sequences of the brain and surrounding structures were obtained without intravenous contrast. COMPARISON:  CT head earlier today.  Prior MR 02/14/2012. FINDINGS: Brain: Large area of encephalomalacia RIGHT hemisphere, marked compensatory enlargement of the ventricle, RIGHT lateral ventricle consistent with a nearly holohemispheric MCA/PCA territory insult. This appearance is stable from 2013. New area of restricted diffusion corresponding low ADC, in the RIGHT hemisphere involves the residual medial posterior temporal and occipital lobe, RIGHT thalamus, RIGHT basal ganglia and regional white matter, consistent with acute infarction. No features to suggest acute hemorrhage. Generalized atrophy. Extensive small vessel disease throughout the  white matter, more pronounced on the LEFT due to more visible brain substance. Extensive chronic microvascular ischemic changes also observed in the pons. Vascular: There is absent flow void in the RIGHT petrous and cavernous ICA. It is  unclear if this is acute or chronic. Supraclinoid RICA is patent, as is the RIGHT MCA, presumably collateral flow. Skull and upper cervical spine: Normal marrow signal. Sinuses/Orbits: Negative. Other: None. IMPRESSION: Acute on chronic cerebral infarction, nonhemorrhagic, RIGHT cerebral hemisphere. Acute nonhemorrhagic infarction involves the basal ganglia, residual posterior temporal and occipital lobe, as well as regional white matter. There is absent flow in the skull base RIGHT internal carotid artery. While recent RIGHT ICA thrombosis may have been contributory to the acute infarction, there are no specific imaging features which would allow an estimate of acuity versus chronicity. Extensive chronic microvascular ischemic change, likely hypertensive related. Electronically Signed   By: Elsie Stain M.D.   On: 02/03/2019 16:21   2D Echocardiogram   1. The left ventricle has normal systolic function, with an ejection fraction of 55-60%. The cavity size was normal. Left ventricular diastolic function could not be evaluated due to nondiagnostic images. No evidence of left ventricular regional wall  motion abnormalities.  2. The right ventricle has normal systolic function. The cavity was normal. There is no increase in right ventricular wall thickness.  3. Agitated saline contrast was given intravenously to evaluate for intracardiac shunting. There was very late appearance of microcavitary buttles in LA and LV. This likely does not represent a PFO or ASD.  PHYSICAL EXAM  Temp:  [98 F (36.7 C)-100 F (37.8 C)] 100 F (37.8 C) (03/25 1214) Pulse Rate:  [59-73] 73 (03/25 1214) Resp:  [14-18] 18 (03/25 1214) BP: (118-169)/(64-122) 156/85 (03/25 1214) SpO2:  [96 %-100 %] 97 % (03/25 1214)  General - Well nourished, well developed, in no apparent distress.  Ophthalmologic - fundi not visualized due to noncooperation.  Cardiovascular - Regular rate and rhythm.  Mental Status -  Level of  arousal and orientation to time, place, and person were intact. Language including expression, naming, repetition, comprehension was assessed and found intact. Moderate dysarthria and bradyphasia   Cranial Nerves II - XII - II - left hemianopia. III, IV, VI - Extraocular movements intact. V - Facial sensation intact bilaterally. VII - left facial droop. VIII - Hearing & vestibular intact bilaterally. X - Palate elevates symmetrically. XI - Chin turning & shoulder shrug intact bilaterally. XII - Tongue protrusion intact.  Motor Strength - The patient's strength was normal in RUE and RLE, spastic hemiplegia on the left, with left UE flex position and 0/5 motor strength, LLE 2-/5 proximal and 2/5 distal and pronator drift was absent.  Bulk was normal and fasciculations were absent.   Motor Tone - Muscle tone was assessed at the neck and appendages and was increased on the LUE and LLE, with LUE> LLE.  Reflexes - The patient's reflexes were 3+ on the LUE and LLE and she had babinski on the left.  Sensory - Light touch, temperature/pinprick were assessed and were symmetrical subjectively.    Coordination - The patient had normal movements in the right hand and foot with no ataxia or dysmetria.  Tremor was absent.  Gait and Station - deferred.   ASSESSMENT/PLAN [name] is a 63 y.o. AA female with history of previous R MCA stroke, HTN, bipolar d/o schizophrenia, tobacco abuse, alcoholism, CHF, PE in 89, blood transfusions and polysubstance abuse presenting with worsening L  side weakness and frequent falls. Mri showed acute R MCA infarct adjacent to previous infarcts. .   Stroke:  Mult right brain infarcts adjacent to previous R MCA infarcts in setting of R ICA acute on chronic occlusion and current cocaine use. Infarct likely secondary to large vessel source  Resultant worsening left facial droop and left spastic hemiplegia  CT head No acute stroke. Old R MCA and PCA  infarcts.     MRI  Acute on chronic R BG, posterior temporal and occipital lobe and regional WM infarcts. No R ICA flow at skull base. Extensive Small vessel disease.   CTA head & neck acute chronic R ICA occlusion. L ICA atherosclerosis. 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 No source of embolus. Late bubbles in LA and LV NOT felt to reflect PFO  LDL 92  HgbA1c 5.8  UDS positive for cocaine  Lovenox 40 mg sq daily for VTE prophylaxis  Diet - regular  clopidogrel 75 mg daily prior to admission, now on aspirin 325 mg daily. Recommend continue aspirin 325 alone at d/c given enlarging left ICA aneurysm  Therapy recommendations:  SLP followup  Disposition:  pending   Hx stroke/TIA  04/2011 admitted for slurred speech and dizziness.  MRI showed large R MCA and PCA infarcts d/t R M1 stenosis concerning for thrombus, EF 60%, UDS positive cocaine, d/c to CIR with aspirin/Plavix and Lipitor.  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 Corliss Skains to possible intervention if appropriate  Hypertension  Stable . Permissive hypertension (OK if <180/105) but gradually normalize in 5-7 days . Long-term BP goal normotensive  Hyperlipidemia  Home meds:  lipitor 20 Now on lipitor 40  LDL 92, goal < 70  Continue statin at discharge  Cocaine abuse  UDS in 2012 and this admission all positive for cocaine  Cocaine cessation education provided  Patient willing to quit  Tobacco abuse  Current smoker  Smoking cessation counseling provided  Pt is willing to quit  Other Stroke Risk Factors  Hx alcohol abuse  Hx Congestive heart failure  Hx PE  Other Active Problems  Schizophrenia with bipolar d/o  Hospital day # 1  Neurology will sign off. Please call with questions. Pt will follow up with stroke  clinic NP at [facility] in about 4 weeks. Thanks for the consult.  Marvel Plan, MD PhD Stroke Neurology 02/04/2019 7:08 PM    To contact Stroke Continuity provider, please refer to [email or web address]. After hours, contact General Neurology

## 2019-02-04 NOTE — H&P (Signed)
Internal Medicine Attending Admission Note Date: 02/04/2019  Patient name: [name] M [name] Medical record number: [id number] Date of birth: [birth year] Age: 63 y.o. Gender: female  I saw and evaluated the patient. I reviewed the resident's note and I agree with the resident's findings and plan as documented in the resident's note.  Chief Complaint(s): Headaches and falls.  History - key components related to admission:  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  She apparently is cared for by her boyfriend who reported he has noted progressive weakness and cognitive changes.  She states the left-sided weakness is worse and denied any orthostatic changes with regard to dizziness, chest pain, palpitations, or shortness of breath at the time of her headache and falls.  Her last use of cocaine was 3 days prior to admission.  In the emergency department she was found to be hypertensive with no acute bleed on noncontrasted head CT.  An MRI demonstrated nonhemorrhagic infarction of the residual right posterior temporal and occipital lobes, basal ganglia, and white matter, felt to be secondary to vasospasm from cocaine versus small vessel disease.  She was therefore admitted to the internal medicine teaching service for further evaluation and care.  When seen on rounds the morning after admission she continued to have diffuse headaches and weakness that was unchanged from presentation.  She was without other acute complaints.  Physical Exam - key components related to admission:  Vitals:   02/04/19 0337 02/04/19 0739 02/04/19 1214 02/04/19 1639  BP: (!) 152/64 (!) 133/94 (!) 156/85 139/83   Pulse:  72 73 72  Resp:  18 18 16   Temp:  98.6 F (37 C) 100 F (37.8 C) 98.1 F (36.7 C)  TempSrc:  Oral Oral Oral  SpO2:  96% 97% 98%  Weight:      Height:       General: Well-developed, well-nourished, woman who appears chronically ill lying comfortably in bed in no acute distress. Neuro: Left facial droop, left upper extremity paralysis with contracture, left lower extremity weakness at 2+/5, right upper and lower extremity weakness at 4/5, resting tremor of the right upper extremity with no cogwheeling.  Lab results:  Basic Metabolic Panel: Recent Labs    02/03/19 1205 02/03/19 1232  NA 141  --   K 3.7  --   CL 116*  --   CO2 22  --   GLUCOSE 105*  --   BUN 11  --   CREATININE 1.04* 1.00  CALCIUM 9.0  --    Liver Function Tests: Recent Labs    02/03/19 1205  AST 19  ALT 10  ALKPHOS 63  BILITOT 0.5  PROT 7.0  ALBUMIN 4.0   CBC: Recent Labs    02/03/19 1205  WBC 4.9  NEUTROABS 3.2  HGB 14.1  HCT 47.0*  MCV 84.2  PLT 170   CBG: Recent Labs    02/03/19 1143  GLUCAP 91   Hemoglobin A1C: Recent Labs    02/04/19 0510  HGBA1C 5.8*   Fasting Lipid Panel: Recent Labs    02/04/19 0510  CHOL 168  HDL 55  LDLCALC 92  TRIG 103  CHOLHDL 3.1   Coagulation: Recent  Labs    02/03/19 1205  INR 1.0   Urine Drug Screen:  Cocaine positive Opiates, benzodiazepines, amphetamines, THC, and barbiturates negative  Urinalysis:  Cloudy, yellow, specific gravity 1.014, pH 7.0, hemoglobin small, nitrite negative, leukocytes negative, red blood cells 0-5 per high-power field  Misc. Labs:  HIV nonreactive  Imaging results:  Ct Angio Head W Or Wo Contrast  Result Date: 02/03/2019 CLINICAL DATA:  Focal neurological deficit. Falling. Acute infarction on the right. EXAM: CT ANGIOGRAPHY HEAD AND NECK TECHNIQUE: Multidetector CT imaging of the head and neck was performed using the standard protocol during bolus administration of intravenous contrast.  Multiplanar CT image reconstructions and MIPs were obtained to evaluate the vascular anatomy. Carotid stenosis measurements (when applicable) are obtained utilizing NASCET criteria, using the distal internal carotid diameter as the denominator. CONTRAST:  OMNIPAQUE IOHEXOL 350 MG/ML SOLN COMPARISON:  None. FINDINGS: CT HEAD FINDINGS Brain: No visible change since earlier today. Extensive chronic ischemic change throughout the right hemisphere with atrophy, encephalomalacia, gliosis and porencephaly. The acute infarction demonstrated in the right thalamus/basal ganglia and parieto-occipital brain can not be specifically appreciated on the CT. Widespread changes of chronic small vessel disease again evident throughout the hemispheric white matter. Chronic small-vessel ischemic changes throughout the pons. Vascular: Negative Skull: Negative Sinuses: Clear Orbits: Negative Review of the MIP images confirms the above findings CTA NECK FINDINGS Aortic arch: Aortic atherosclerosis. No aneurysm or dissection. Calcification at the brachiocephalic vessel origins but no flow limiting stenosis. Right carotid system: Common carotid artery widely patent to the bifurcation. Acute appearing occlusion of the right internal carotid artery with what appears to be luminal clock with its crescent of surrounding contrast. I do not definitely establish antegrade flow extending to the skull base and brain. Left carotid system: Common carotid artery is widely patent to the bifurcation. There is calcification in the ICA bulb, but without stenosis compared to the more distal cervical ICA. Vertebral arteries: Both vertebral artery origins are widely patent. Skeleton: Ordinary cervical spondylosis. Other neck: No mass or lymphadenopathy. Upper chest: Scarring in the upper lobes. Review of the MIP images confirms the above findings CTA HEAD FINDINGS Anterior circulation: Left internal carotid artery is patent through the skull base and  siphon region. There is some atherosclerotic calcification in the carotid siphon but no stenosis greater than 30%. No antegrade flow in the right carotid siphon. Left internal carotid artery supplies the left middle cerebral artery territory, both anterior cerebral artery territories in the right middle cerebral artery territory through patent communicating arteries. 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 Posterior circulation: Both vertebral arteries are patent to the basilar. No basilar stenosis. Posterior circulation branch vessels are patent. Patent posterior communicating artery on the left. Venous sinuses: Patent and normal. Anatomic variants: None other significant. Delayed phase: No abnormal enhancement. Review of the MIP images confirms the above findings IMPRESSION: I believe that the right internal carotid artery is likely to be acute based on the appearance at CTA. There is a central filling defect within the ICA bulb with some surrounding contrast, and complete occlusion of the mid to distal cervical ICA. This looks like an acute thrombotic or embolic occlusion. There is no antegrade flow in the right carotid siphon. Atherosclerotic calcification at the left ICA bulb but without stenosis. Left ICA patent through the skull base and siphon region, supplying the anterior circulation on both sides. 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 This has enlarged since 2012. Specialist evaluation of this would be warranted at some point. Electronically Signed   By: Paulina Fusi M.D.   On: 02/03/2019 18:36   Ct Head Wo Contrast  Result Date: 02/03/2019 CLINICAL DATA:  Acute headache.  Two falls at home. EXAM: CT HEAD WITHOUT CONTRAST TECHNIQUE: Contiguous axial images were obtained from the base of the skull through the vertex without intravenous contrast. COMPARISON:  MR  brain dated February 14, 2012. CT head dated October 26, 2011. FINDINGS: Brain: No evidence of acute infarction, hemorrhage, hydrocephalus, extra-axial collection or mass lesion/mass effect. Extensive right cerebral hemispheric encephalomalacia related to old large MCA and PCA territory infarct, with prominent ex vacuo dilatation of the right lateral ventricle. Progressive moderate to severe chronic microvascular ischemic changes. Vascular: No hyperdense vessel or unexpected calcification. Skull: Normal. Negative for fracture or focal lesion. Sinuses/Orbits: No acute finding. Other: None. IMPRESSION: 1.  No acute intracranial abnormality. 2. Extensive right cerebral hemispheric encephalomalacia related to old large MCA and PCA territory infarcts. Electronically Signed   By: Obie Dredge M.D.   On: 02/03/2019 13:06   Ct Angio Neck W Or Wo Contrast  Result Date: 02/03/2019 CLINICAL DATA:  Focal neurological deficit. Falling. Acute infarction on the right. EXAM: CT ANGIOGRAPHY HEAD AND NECK TECHNIQUE: Multidetector CT imaging of the head and neck was performed using the standard protocol during bolus administration of intravenous contrast. Multiplanar CT image reconstructions and MIPs were obtained to evaluate the vascular anatomy. Carotid stenosis measurements (when applicable) are obtained utilizing NASCET criteria, using the distal internal carotid diameter as the denominator. CONTRAST:  OMNIPAQUE IOHEXOL 350 MG/ML SOLN COMPARISON:  None. FINDINGS: CT HEAD FINDINGS Brain: No visible change since earlier today. Extensive chronic ischemic change throughout the right hemisphere with atrophy, encephalomalacia, gliosis and porencephaly. The acute infarction demonstrated in the right thalamus/basal ganglia and parieto-occipital brain can not be specifically appreciated on the CT. Widespread changes of chronic small vessel disease again evident throughout the hemispheric white matter. Chronic small-vessel  ischemic changes throughout the pons. Vascular: Negative Skull: Negative Sinuses: Clear Orbits: Negative Review of the MIP images confirms the above findings CTA NECK FINDINGS Aortic arch: Aortic atherosclerosis. No aneurysm or dissection. Calcification at the brachiocephalic vessel origins but no flow limiting stenosis. Right carotid system: Common carotid artery widely patent to the bifurcation. Acute appearing occlusion of the right internal carotid artery with what appears to be luminal clock with its crescent of surrounding contrast. I do not definitely establish antegrade flow extending to the skull base and brain. Left carotid system: Common carotid artery is widely patent to the bifurcation. There is calcification in the ICA bulb, but without stenosis compared to the more distal cervical ICA. Vertebral arteries: Both vertebral artery origins are widely patent. Skeleton: Ordinary cervical spondylosis. Other neck: No mass or lymphadenopathy. Upper chest: Scarring in the upper lobes. Review of the MIP images confirms the above findings CTA HEAD FINDINGS Anterior circulation: Left internal carotid artery is patent through the skull base and siphon region. There is some atherosclerotic calcification in the carotid siphon but no stenosis greater than 30%. No antegrade flow in the right carotid siphon. Left internal carotid artery supplies the left middle cerebral artery territory, both anterior cerebral artery territories in the right middle cerebral artery territory through patent communicating arteries. 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 Posterior circulation: Both vertebral arteries are patent to the basilar. No basilar stenosis. Posterior circulation branch vessels are patent. Patent posterior communicating artery on the left. Venous sinuses: Patent and normal. Anatomic variants: None other significant. Delayed phase: No abnormal  enhancement. Review of the MIP images confirms the above findings IMPRESSION: I believe that the right internal carotid artery is likely to be acute based on the appearance at CTA. There is a central filling defect within the ICA bulb with some surrounding contrast, and complete occlusion of the mid to distal cervical ICA. This looks like an acute thrombotic or embolic occlusion. There is no antegrade flow in the right carotid siphon. Atherosclerotic calcification at the left ICA bulb but without stenosis. Left ICA patent through the skull base and siphon region, supplying the anterior circulation on both sides. 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 This has enlarged since 2012. Specialist evaluation of this would be warranted at some point. Electronically Signed   By: Paulina Fusi M.D.   On: 02/03/2019 18:36   Mr Brain Wo Contrast  Result Date: 02/03/2019 CLINICAL DATA:  Increased falls.  Previous CVA. EXAM: MRI HEAD WITHOUT CONTRAST TECHNIQUE: Multiplanar, multiecho pulse sequences of the brain and surrounding structures were obtained without intravenous contrast. COMPARISON:  CT head earlier today.  Prior MR 02/14/2012. FINDINGS: Brain: Large area of encephalomalacia RIGHT hemisphere, marked compensatory enlargement of the ventricle, RIGHT lateral ventricle consistent with a nearly holohemispheric MCA/PCA territory insult. This appearance is stable from 2013. New area of restricted diffusion corresponding low ADC, in the RIGHT hemisphere involves the residual medial posterior temporal and occipital lobe, RIGHT thalamus, RIGHT basal ganglia and regional white matter, consistent with acute infarction. No features to suggest acute hemorrhage. Generalized atrophy. Extensive small vessel disease throughout the white matter, more pronounced on the LEFT due to more visible brain substance. Extensive chronic microvascular ischemic changes also  observed in the pons. Vascular: There is absent flow void in the RIGHT petrous and cavernous ICA. It is unclear if this is acute or chronic. Supraclinoid RICA is patent, as is the RIGHT MCA, presumably collateral flow. Skull and upper cervical spine: Normal marrow signal. Sinuses/Orbits: Negative. Other: None. IMPRESSION: Acute on chronic cerebral infarction, nonhemorrhagic, RIGHT cerebral hemisphere. Acute nonhemorrhagic infarction involves the basal ganglia, residual posterior temporal and occipital lobe, as well as regional white matter. There is absent flow in the skull base RIGHT internal carotid artery. While recent RIGHT ICA thrombosis may have been contributory to the acute infarction, there are no specific imaging features which would allow an estimate of acuity versus chronicity. Extensive chronic microvascular ischemic change, likely hypertensive related. Electronically Signed   By: Elsie Stain M.D.   On: 02/03/2019 16:21   Other results:  EKG: Personally reviewed.  Normal sinus rhythm at 63 bpm, normal axis, first-degree AV block, septal Q waves, no LVH by voltage, no ST or T wave changes.  Unchanged from the previous ECG on May 01, 2011.  Assessment & Plan by Problem:  Ms. 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  An MRI demonstrated nonhemorrhagic  infarction of the residual right posterior temporal and occipital lobes, basal ganglia, and white matter, felt to be secondary to vasospasm from cocaine versus small vessel disease.  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  1) Right sided acute nonhemorrhagic infarcts secondary to cocaine use versus  small vessel disease: She was started on aspirin 325 mg by mouth daily and atorvastatin 80 mg by mouth daily.  Her hemoglobin A1c was 5.8.  She initially was managed with permissive hypertension and we will gradually get control of her chronic hypertension.  She is interested in rehab for cocaine and we will try to arrange this.  Speech and physical therapy feels she is a candidate for a skilled nursing facility and we will begin to work on placement.  2) Disposition: Pending placement in a skilled nursing facility.

## 2019-02-04 NOTE — Progress Notes (Signed)
OT Cancellation Note  Patient Details Name: [name] MRN: [id number] DOB: 06-12-1956   Cancelled Treatment:    Reason Eval/Treat Not Completed: Patient at procedure or test/ unavailable (echo); will follow up for OT eval as able.   Marcy Siren, OT [facility] Pager [phone] Office [phone]   [name] 02/04/2019, 9:56 AM

## 2019-02-04 NOTE — Evaluation (Addendum)
Physical Therapy Evaluation Patient Details Name: [name] MRN: [id number] DOB: September 06, 1956 Today's Date: 02/04/2019   History of Present Illness   [name] is a 63 yo female with a HTN, hx of PE, schizophrenia, bipolar disorder prior stroke with residual left arm paralysis and left leg weakness, and cocaine use presenting with stroke symptoms, HA and falls.  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  Clinical Impression  Pt admitted with/for s/s of stroke.  Imaging corroborates.  Pt needing moderate to maximal assist for basic mobility at this time..  Pt currently limited functionally due to the problems listed. ( See problems list.)   Pt will benefit from PT to maximize function and safety in order to get ready for next venue listed below.     Follow Up Recommendations SNF;Supervision/Assistance - 24 hour    Equipment Recommendations  Other (comment)(TBA)    Recommendations for Other Services       Precautions / Restrictions Precautions Precautions: Fall Precaution Comments: LUE contractures Restrictions Weight Bearing Restrictions: No      Mobility  Bed Mobility Overal bed mobility: Needs Assistance Bed Mobility: Supine to Sit;Sit to Supine     Supine to sit: Max assist Sit to supine: Mod assist   General bed mobility comments: requires assist for LLE, assist for trunk upright, pt able to assist with scooting hips towards EOB  Transfers Overall transfer level: Needs assistance   Transfers: Sit to/from Stand;Lateral/Scoot Transfers Sit to Stand: Mod assist;Max assist        Lateral/Scoot Transfers: Max assist General transfer comment: pt able to attain full upright stance with partial w/bearing on the L LE.  Pt also scoot up toward HOB x3 with assist, before getting back in bed.  Ambulation/Gait                Stairs             Wheelchair Mobility    Modified Rankin (Stroke Patients Only) Modified Rankin (Stroke Patients Only) Pre-Morbid Rankin Score: Moderately severe disability Modified Rankin: Severe disability     Balance Overall balance assessment: Needs assistance Sitting-balance support: Single extremity supported;Feet supported Sitting balance-Leahy Scale: Poor Sitting balance - Comments: pt able to maintain static sitting with close minguard assist, though often with L lateral lean, pt able to correct with intermittent minA Postural control: Left lateral lean Standing balance support: Single extremity supported;Bilateral upper extremity supported Standing balance-Leahy Scale: Poor Standing balance comment: attained and maintained standing at EOB with moderate assist                             Pertinent Vitals/Pain Pain Assessment: Faces Pain Score: 8  Faces Pain Scale: Hurts little more Pain Location: headache, LUE with attempts at passive stretching Pain Descriptors / Indicators: Discomfort;Headache Pain Intervention(s): Monitored during session    Home Living Family/patient expects to be discharged to:: Private residence Living Arrangements: Spouse/significant other(boyfriend, "[name]") Available Help at Discharge: Family;Friend(s);Personal care attendant;Available PRN/intermittently Type of Home: House Home Access: Stairs to enter   Entrance Stairs-Number of Steps: 15 Home Layout: One level Home Equipment: Cane - single point;Shower seat Additional Comments: may need to clarify additional DME and home setup - pt somewhat agitated with questioning this session    Prior Function Level of Independence: Needs assistance   Gait / Transfers Assistance Needed: reports using SPC  for mobility  ADL's / Homemaking Assistance Needed: pt reports she has an aide who assists with ADL/iADL, pt unable to state how many days/wk aide comes, when asked if she comes every day pt reports yes,  reports she is there "most of the day"         Hand Dominance        Extremity/Trunk Assessment        Lower Extremity Assessment Lower Extremity Assessment: RLE deficits/detail;LLE deficits/detail RLE Deficits / Details: generally functional and strength>4/5 LLE Deficits / Details: moves in synergy, gross extension 3-/5, gross flexion 2/5 LLE Sensation: (pt reports good sensation) LLE Coordination: decreased gross motor;decreased fine motor       Communication   Communication: No difficulties  Cognition Arousal/Alertness: Awake/alert Behavior During Therapy: Flat affect;Agitated;Impulsive(agitated with questions start of session) Overall Cognitive Status: No family/caregiver present to determine baseline cognitive functioning Area of Impairment: Attention;Memory;Following commands;Safety/judgement;Awareness                   Current Attention Level: Sustained Memory: Decreased short-term memory Following Commands: Follows one step commands inconsistently;Follows one step commands with increased time Safety/Judgement: Decreased awareness of safety;Decreased awareness of deficits Awareness: Intellectual   General Comments: pt slightly impulsive with mobility and requires instruction to remain seated prior to attempts to transfer; requires repetition of some commands; pt initially attempting to pull more covers over her at start of session, however instead of blankets pt pulling gown over her head       General Comments      Exercises     Assessment/Plan    PT Assessment Patient needs continued PT services  PT Problem List Decreased strength;Decreased activity tolerance;Decreased balance;Decreased mobility;Decreased coordination;Decreased knowledge of use of DME       PT Treatment Interventions      PT Goals (Current goals can be found in the Care Plan section)  Acute Rehab PT Goals Patient Stated Goal: get home. PT Goal Formulation: With patient Time  For Goal Achievement: 02/18/19 Potential to Achieve Goals: Fair    Frequency Min 3X/week   Barriers to discharge        Co-evaluation               AM-PAC PT "6 Clicks" Mobility  Outcome Measure Help needed turning from your back to your side while in a flat bed without using bedrails?: A Lot Help needed moving from lying on your back to sitting on the side of a flat bed without using bedrails?: A Lot Help needed moving to and from a bed to a chair (including a wheelchair)?: A Lot Help needed standing up from a chair using your arms (e.g., wheelchair or bedside chair)?: A Lot Help needed to walk in hospital room?: Total Help needed climbing 3-5 steps with a railing? : Total 6 Click Score: 10    End of Session   Activity Tolerance: Patient tolerated treatment well Patient left: in bed;with call bell/phone within reach;with bed alarm set Nurse Communication: Mobility status PT Visit Diagnosis: Muscle weakness (generalized) (M62.81);Hemiplegia and hemiparesis;Other abnormalities of gait and mobility (R26.89) Hemiplegia - Right/Left: Left Hemiplegia - caused by: Cerebral infarction    Time: 8280-0349 PT Time Calculation (min) (ACUTE ONLY): 17 min   Charges:   PT Evaluation $PT Eval Moderate Complexity: 1 Mod          02/04/2019  Crowder Bing, PT Acute Rehabilitation Services [phone]  (pager) [phone]  (office)  Eliseo Gum [place] 02/04/2019, 4:38 PM

## 2019-02-04 NOTE — Progress Notes (Signed)
Internal Medicine Attending  Date: 02/04/2019  Patient name: [name] Medical record number: [id number] Date of birth: 07/14/1956 Age: 63 y.o. Gender: female  I saw and evaluated the patient. I reviewed the resident's note by Dr. Karilyn Cota and I agree with the resident's findings and plans as documented in her progress note.  Please see my H&P dated February 04, 2019 for the specifics of my evaluation, assessment, and plan from earlier in the day.

## 2019-02-04 NOTE — Progress Notes (Signed)
Occupational Therapy Eval  This 63 y/o female presents with the following deficits. PTA pt reports she was mod independent with mobility using SPC, reports she received assist for ADL from an aide. 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 Pt requiring maxA for bed mobility, close minguard-minA for static sitting balance EOB, and MaxA for completion of squat pivot transfer OOB to recliner this session. Pt currently requires modA for UB ADL, maxA for LB. Pt requires frequent cues for safety due to some impulsivity with mobility, as well as repetition and multimodal cues to follow commands. She will benefit from continued acute and post acute OT services to maximize her safety and independence with ADL and mobility. Will follow.      02/04/19 1150  OT Visit Information  Last OT Received On 02/04/19  Assistance Needed +2  History of Present Illness  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  Precautions  Precautions Fall  Precaution Comments LUE contractures  Restrictions  Weight Bearing Restrictions No  Home Living  Family/patient expects to be discharged to: Private residence  Living Arrangements Spouse/significant other (boyfriend, "[name]")  Available Help at Discharge Family;Friend(s);Personal care attendant;Available PRN/intermittently  Type of Home House  Home Access Stairs to enter  Entrance Stairs-Number of Steps 15  Home Layout One level  Bathroom Shower/Tub Walk-in shower  Bathroom Toilet Handicapped height  Home Equipment [place] - single point;Shower seat  Additional Comments  may need to clarify additional DME and home setup - pt somewhat agitated with questioning this session  Prior Function  Level of Independence Needs assistance  Gait / Transfers Assistance Needed reports using SPC for mobility  ADL's / Homemaking Assistance Needed pt reports she has an aide who assists with ADL/iADL, pt unable to state how many days/wk aide comes, when asked if she comes every day pt reports yes, reports she is there "most of the day"   Communication  Communication No difficulties  Pain Assessment  Pain Assessment Faces  Faces Pain Scale 4  Pain Location headache, LUE with attempts at passive stretching  Pain Descriptors / Indicators Discomfort;Headache  Pain Intervention(s) Repositioned;Monitored during session  Cognition  Arousal/Alertness Awake/alert  Behavior During Therapy Flat affect;Agitated;Impulsive (agitated with questions start of session)  Overall Cognitive Status No family/caregiver present to determine baseline cognitive functioning  Area of Impairment Attention;Memory;Following commands;Safety/judgement;Awareness  Current Attention Level Sustained  Memory Decreased short-term memory  Following Commands Follows one step commands inconsistently;Follows one step commands with increased time  Safety/Judgement Decreased awareness of safety;Decreased awareness of deficits  Awareness Intellectual  General Comments pt slightly impulsive with mobility and requires instruction to remain seated prior to attempts to transfer; requires repetition of some commands; pt initially attempting to pull more covers over her at start of session, however instead of blankets pt pulling gown over her head   Upper Extremity Assessment  Upper Extremity Assessment LUE deficits/detail  LUE Deficits / Details contracted LUE at baseline, minimally able to tolerate passive stretching, painful with attempts to stretch L digits   LUE Sensation  (denies decreased sensation)  ADL  Overall  ADL's  Needs assistance/impaired  Eating/Feeding Set up;Sitting  Eating/Feeding Details (indicate cue type and reason) drinking orange juice during session  Grooming Set up;Sitting;Min guard  Grooming Details (indicate cue type and reason) supported sitting  Upper Body Bathing Moderate assistance;Sitting  Lower Body Bathing Maximal assistance;Sit to/from stand;Sitting/lateral leans  Upper Body Dressing  Moderate assistance;Sitting  Upper Body Dressing Details (indicate cue type and reason) requires increased assist to readjust gown start of session  Lower Body Dressing Maximal assistance;Sit to/from stand;Sitting/lateral leans  Toilet Transfer Maximal assistance;Squat-pivot  Toilet Transfer Details (indicate cue type and reason) simulated via transfer to recliner  Toileting- Clothing Manipulation and Hygiene Maximal assistance;Sit to/from stand  Functional mobility during ADLs Maximal assistance (squat pivot transfers)  General ADL Comments pt with L side weakness, inattention, impaired cognition  Vision- Assessment  Vision Assessment? Vision impaired- to be further tested in functional context  Additional Comments pt noted does not attend to L side, she is able to turn head towards the L and look towards PT standing in doorway with mod cues to do so   Bed Mobility  Overal bed mobility Needs Assistance  Bed Mobility Supine to Sit  Supine to sit Max assist  General bed mobility comments requires assist for LLE, assist for trunk upright, pt able to assist with scooting hips towards EOB  Transfers  Overall transfer level Needs assistance  Equipment used 1 person hand held assist  Transfers Squat Pivot Transfers;Sit to/from Stand  Sit to Stand Max assist  Squat pivot transfers Max assist  General transfer comment attempted sit<>stand from EOB, pt able to come to partial stand but unable to fully extend through LEs to achieve full upright position; x3 attempts for transfer to recliner,  initially attempted having pt reach for armrest of recliner - pt unable to process/follow direction, opted to have pt hold onto therapist elbow via face to face method for transition to recliner, once seated utilized +2 to scoot hips back into recliner   Balance  Overall balance assessment Needs assistance  Sitting-balance support Single extremity supported;Feet supported  Sitting balance-Leahy Scale Poor  Sitting balance - Comments pt able to maintain static sitting with close minguard assist, though often with L lateral lean, pt able to correct with intermittent minA  Postural control Left lateral lean  Standing balance support Single extremity supported  Standing balance-Leahy Scale Poor  OT - End of Session  Equipment Utilized During Treatment Gait belt  Activity Tolerance Patient tolerated treatment well  Patient left in chair;with call bell/phone within reach;with chair alarm set;with nursing/sitter in room  Nurse Communication Mobility status  OT Assessment  OT Recommendation/Assessment Patient needs continued OT Services  OT Visit Diagnosis Other abnormalities of gait and mobility (R26.89);Muscle weakness (generalized) (M62.81);Hemiplegia and hemiparesis  Hemiplegia - Right/Left Left  Hemiplegia - caused by Cerebral infarction  OT Problem List Decreased strength;Decreased range of motion;Decreased activity tolerance;Impaired balance (sitting and/or standing);Impaired vision/perception;Decreased cognition;Decreased coordination;Decreased safety awareness;Impaired tone;Decreased knowledge of precautions;Decreased knowledge of use of DME or AE;Impaired UE functional use  OT Plan  OT Frequency (ACUTE ONLY) Min 2X/week  OT Treatment/Interventions (ACUTE ONLY) Self-care/ADL training;Therapeutic exercise;Neuromuscular education;DME and/or AE instruction;Therapeutic activities;Cognitive remediation/compensation;Visual/perceptual remediation/compensation;Patient/family education;Balance training   AM-PAC OT "6 Clicks" Daily Activity Outcome Measure (Version 2)  Help from another person eating meals? 3  Help from another person taking care of personal grooming? 3  Help from another person toileting, which includes using toliet, bedpan, or urinal? 2  Help from another person bathing (including washing, rinsing, drying)? 2  Help from another person to  put on and taking off regular upper body clothing? 2  Help from another person to put on and taking off regular lower body clothing? 2  6 Click Score 14  OT Recommendation  Follow Up Recommendations SNF;Supervision/Assistance - 24 hour  OT Equipment Other (comment) (defer to next venue)  Individuals Consulted  Consulted and Agree with Results and Recommendations Patient  Acute Rehab OT Goals  Patient Stated Goal to get warm  OT Goal Formulation With patient  Time For Goal Achievement 02/18/19  Potential to Achieve Goals Good  OT Time Calculation  OT Start Time (ACUTE ONLY) 1109  OT Stop Time (ACUTE ONLY) 1141  OT Time Calculation (min) 32 min  OT General Charges  $OT Visit 1 Visit  OT Evaluation  $OT Eval Moderate Complexity 1 Mod  OT Treatments  $Self Care/Home Management  8-22 mins    Marcy Siren, OT Supplemental Rehabilitation Services Pager [phone] Office [phone]

## 2019-02-05 DIAGNOSIS — R296 Repeated falls: Secondary | ICD-10-CM

## 2019-02-05 DIAGNOSIS — I6521 Occlusion and stenosis of right carotid artery: Secondary | ICD-10-CM | POA: Diagnosis present

## 2019-02-05 DIAGNOSIS — I720 Aneurysm of carotid artery: Secondary | ICD-10-CM | POA: Diagnosis present

## 2019-02-05 DIAGNOSIS — F1490 Cocaine use, unspecified, uncomplicated: Secondary | ICD-10-CM

## 2019-02-05 DIAGNOSIS — G8194 Hemiplegia, unspecified affecting left nondominant side: Secondary | ICD-10-CM | POA: Diagnosis present

## 2019-02-05 MED ORDER — KETOROLAC TROMETHAMINE 30 MG/ML IJ SOLN
30.0000 mg | Freq: Once | INTRAMUSCULAR | Status: AC
  Administered 2019-02-05: 30 mg via INTRAVENOUS
  Filled 2019-02-05: qty 1

## 2019-02-05 NOTE — Progress Notes (Signed)
Physical Therapy Treatment Patient Details Name: [name] MRN: [id number] DOB: [birth year] Today's Date: 02/05/2019    History of Present Illness  [name] is a 63 yo female with a HTN, hx of PE, schizophrenia, bipolar disorder prior stroke with residual left arm paralysis and left leg weakness, and cocaine use presenting with stroke symptoms, HA and falls.  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    PT Comments    Pt continues to presents with L sided weakness and neglect.  Performed core rotation and UE stretching on L arm in seated position.  Pt presents with  L lateral lean and required constant cueing to correct.  Pt required assistance to stand but is unable to turn and pivot due to innability to move LLE.  Based on progression to standing will update recommendations to CIR at this time.  Pt has a boyfriend but due to visitor restriction it is unclear if he will be able to provide assistance at d/c.  If he cannot provide assistance then SNF will be the best option.     Follow Up Recommendations  Supervision/Assistance - 24 hour;CIR     Equipment Recommendations  Other (comment)(TBA)    Recommendations for Other Services       Precautions / Restrictions Precautions Precautions: Fall Precaution Comments: LUE spasticity vs. tone able to achieve ROM Restrictions Weight Bearing Restrictions: No    Mobility  Bed Mobility Overal bed mobility: Needs Assistance Bed Mobility: Rolling;Sidelying to Sit Rolling: Mod assist;+2 for physical assistance Sidelying to sit: Mod assist;+2 for physical assistance       General bed mobility comments: Cues for hand placement and LE advancement to edge of bed.  Pt able to bring her LEs to the edge of the bed and required assistance to elevate trunk into sitting.  Presents with L lateral lean in sitting'  Transfers Overall  transfer level: Needs assistance Equipment used: 2 person hand held assist Transfers: Sit to/from Stand;Lateral/Scoot Transfers Sit to Stand: Mod assist;+2 physical assistance        Lateral/Scoot Transfers: Mod assist;+2 physical assistance(after failed attempt to pivot once in standing require +2 lateral scoot.  ) General transfer comment: Pt required blocking of L knee in standing.  Performed standing trials with weight shifting but patient is unable to advance LLE to step toward chair.    Ambulation/Gait Ambulation/Gait assistance: (NT unable to advance steps. )               Stairs             Wheelchair Mobility    Modified Rankin (Stroke Patients Only)       Balance Overall balance assessment: Needs assistance Sitting-balance support: Single extremity supported;Feet supported Sitting balance-Leahy Scale: Poor       Standing balance-Leahy Scale: Poor                              Cognition Arousal/Alertness: Awake/alert Behavior During Therapy: Flat affect Overall Cognitive Status: No family/caregiver present to determine baseline cognitive functioning Area of Impairment: Attention;Memory;Following commands;Safety/judgement;Awareness                   Current Attention Level: Sustained Memory: Decreased short-term memory Following Commands: Follows one step commands inconsistently;Follows one step commands with increased time Safety/Judgement: Decreased awareness of safety;Decreased awareness of deficits Awareness: Intellectual  General Comments: required constant cueing througout session      Exercises      General Comments        Pertinent Vitals/Pain Pain Assessment: Faces Faces Pain Scale: Hurts whole lot Pain Location: headache and LUE with stretching and ROM.   Pain Descriptors / Indicators: Discomfort;Headache Pain Intervention(s): Monitored during session;Repositioned    Home Living                       Prior Function            PT Goals (current goals can now be found in the care plan section) Acute Rehab PT Goals Patient Stated Goal: get home. Potential to Achieve Goals: Fair Progress towards PT goals: Progressing toward goals    Frequency    Min 3X/week      PT Plan Discharge plan needs to be updated    Co-evaluation              AM-PAC PT "6 Clicks" Mobility   Outcome Measure  Help needed turning from your back to your side while in a flat bed without using bedrails?: A Lot Help needed moving from lying on your back to sitting on the side of a flat bed without using bedrails?: A Lot Help needed moving to and from a bed to a chair (including a wheelchair)?: A Lot Help needed standing up from a chair using your arms (e.g., wheelchair or bedside chair)?: A Lot Help needed to walk in hospital room?: Total Help needed climbing 3-5 steps with a railing? : Total 6 Click Score: 10    End of Session Equipment Utilized During Treatment: Gait belt Activity Tolerance: Patient tolerated treatment well Patient left: in bed;with call bell/phone within reach;with bed alarm set Nurse Communication: Mobility status PT Visit Diagnosis: Muscle weakness (generalized) (M62.81);Hemiplegia and hemiparesis;Other abnormalities of gait and mobility (R26.89) Hemiplegia - Right/Left: Left Hemiplegia - caused by: Cerebral infarction     Time: 1314-3888 PT Time Calculation (min) (ACUTE ONLY): 23 min  Charges:  $Therapeutic Activity: 23-37 mins                     Joycelyn Rua, PTA Acute Rehabilitation Services Pager [phone] Office [phone]     Aycen Porreca Artis Delay 02/05/2019, 2:23 PM

## 2019-02-05 NOTE — NC FL2 (Signed)
[place] MEDICAID FL2 LEVEL OF CARE SCREENING TOOL     IDENTIFICATION  Patient Name: [name] Birthdate: 1956/07/05 Sex: female Admission Date (Current Location): 02/03/2019  [facility] and [place] Number:  Producer, television/film/video and Address:  [facility] [place], [address] [place], [place] [address]      Provider Number: [id number]  Attending Physician Name and Address:  Anne Shutter, MD  Relative Name and Phone Number:       Current Level of Care: Hospital Recommended Level of Care: Skilled Nursing Facility Prior Approval Number:    Date Approved/Denied:   PASRR Number: [id number]  Discharge Plan: SNF    Current Diagnoses: Patient Active Problem List   Diagnosis Date Noted  . Cocaine abuse (HCC)   . CVA (cerebral vascular accident) (HCC) 02/03/2019    Orientation RESPIRATION BLADDER Height & Weight     Self, Time, Situation, Place  Normal Continent Weight: 135 lb (61.2 kg) Height:  5\' 2"  (157.5 cm)  BEHAVIORAL SYMPTOMS/MOOD NEUROLOGICAL BOWEL NUTRITION STATUS      Continent Diet(see DC summary)  AMBULATORY STATUS COMMUNICATION OF NEEDS Skin   Extensive Assist Verbally Normal                       Personal Care Assistance Level of Assistance  Bathing, Feeding, Dressing Bathing Assistance: Maximum assistance Feeding assistance: Independent Dressing Assistance: Maximum assistance     Functional Limitations Info  Sight, Hearing, Speech Sight Info: Adequate Hearing Info: Adequate Speech Info: Impaired(dysarthria)    SPECIAL CARE FACTORS FREQUENCY  PT (By licensed PT), OT (By licensed OT)     PT Frequency: 5x/wk OT Frequency: 5x/wk            Contractures Contractures Info: Not present    Additional Factors Info  Code Status, Allergies, Psychotropic Code Status Info: Full Allergies Info: NKA Psychotropic Info: Risperdal 1mg  daily at bed         Current Medications (02/05/2019):  This is the current  hospital active medication list Current Facility-Administered Medications  Medication Dose Route Frequency Provider Last Rate Last Dose  . 0.9 %  sodium chloride infusion  100 mL/hr Intravenous Continuous Reymundo Poll, MD 100 mL/hr at 02/04/19 0530 100 mL/hr at 02/04/19 0530  . acetaminophen (TYLENOL) tablet 650 mg  650 mg Oral Q4H PRN Reymundo Poll, MD   650 mg at 02/05/19 1039   Or  . acetaminophen (TYLENOL) solution 650 mg  650 mg Per Tube Q4H PRN Reymundo Poll, MD       Or  . acetaminophen (TYLENOL) suppository 650 mg  650 mg Rectal Q4H PRN Reymundo Poll, MD      . acetaminophen (TYLENOL) suppository 650 mg  650 mg Rectal Once Reymundo Poll, MD      . aspirin suppository 300 mg  300 mg Rectal Daily Reymundo Poll, MD       Or  . aspirin tablet 325 mg  325 mg Oral Daily Reymundo Poll, MD   325 mg at 02/05/19 1041  . atorvastatin (LIPITOR) tablet 40 mg  40 mg Oral q1800 Reymundo Poll, MD   40 mg at 02/04/19 1808  . enoxaparin (LOVENOX) injection 40 mg  40 mg Subcutaneous Q24H Reymundo Poll, MD   40 mg at 02/04/19 1809  . pantoprazole (PROTONIX) EC tablet 40 mg  40 mg Oral Daily Rehman, Areeg N, DO   40 mg at 02/05/19 1041  . pregabalin (LYRICA) capsule 75 mg  75 mg Oral Daily Rehman, Areeg N, DO   75 mg at 02/05/19 1041  . risperiDONE (RISPERDAL) tablet 1 mg  1 mg Oral QHS Rehman, Areeg N, DO   1 mg at 02/04/19 2152  . senna-docusate (Senokot-S) tablet 1 tablet  1 tablet Oral QHS PRN Reymundo Poll, MD         Discharge Medications: Please see discharge summary for a list of discharge medications.  Relevant Imaging Results:  Relevant Lab Results:   Additional Information SS#: [id number]  Baldemar Lenis, LCSW

## 2019-02-05 NOTE — Progress Notes (Signed)
Occupational Therapy Treatment Patient Details Name: [name] MRN: [id number] DOB: 06/17/1956 Today's Date: 02/05/2019    History of present illness  [name] is a 63 yo female with a HTN, hx of PE, schizophrenia, bipolar disorder prior stroke with residual left arm paralysis and left leg weakness, and cocaine use presenting with stroke symptoms, HA and falls.  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   OT comments  Pt tolerating LUE PROM to reduce further contractures- L hand- good hygiene noted and no nails digging into palm, placed washcloth for barrier. Pt tolerating x3 sit to stands with maxA +1 and unable to fully stand upright due to severe weakness on L side and unable to advance either foot forward. Pt performing grooming seated with RUE in set-upA. Pt performing sitting upright engaging abdominals  In unsupported sitting with fair to poor sitting balance as pt would require assist in sitting after 1-2 mins of unsupported sitting. Pt requires continued OT skilled services and requires +2 assist to progress with any transfers or mobility. Pt following 1 step commands well and able to answer  Prior level questions. Pt to be seen acutely for ADL, mobility and progression of HEP. L sided facial droop was present prior. OT to follow acutely.    Follow Up Recommendations  SNF;Supervision/Assistance - 24 hour    Equipment Recommendations  Other (comment)(to be determined)    Recommendations for Other Services      Precautions / Restrictions Precautions Precautions: Fall Precaution Comments: LUE spasticity vs. tone able to achieve ROM Restrictions Weight Bearing Restrictions: No       Mobility Bed Mobility Overal bed mobility: Needs Assistance Bed Mobility: Rolling;Sidelying to Sit Rolling: Mod assist;+2 for physical assistance Sidelying to sit: Mod assist;+2 for  physical assistance       General bed mobility comments: up in recliner  Transfers Overall transfer level: Needs assistance Equipment used: Rolling walker (2 wheeled) Transfers: Sit to/from Stand Sit to Stand: Max assist(sit to stand only)        Lateral/Scoot Transfers: Mod assist;+2 physical assistance(after failed attempt to pivot once in standing require +2 lateral scoot.  ) General transfer comment: Unable to advance L foot at all; pt leaning on recliner in standing, unable to full stand upright without MaxA    Balance Overall balance assessment: Needs assistance Sitting-balance support: Single extremity supported;Feet supported Sitting balance-Leahy Scale: Poor       Standing balance-Leahy Scale: Poor Standing balance comment: leans on back of recliner- unable to stand on own with max+1                           ADL either performed or assessed with clinical judgement   ADL Overall ADL's : Needs assistance/impaired                                     Functional mobility during ADLs: Maximal assistance( sit to stand only x3) General ADL Comments: pt with L side weakness, inattention, impaired cognition; grooming task with set-upA     Vision       Perception     Praxis      Cognition Arousal/Alertness: Awake/alert Behavior During Therapy: Flat affect Overall Cognitive Status: No family/caregiver present to determine baseline cognitive functioning Area of Impairment: Attention;Memory;Safety/judgement;Awareness;Problem  solving                   Current Attention Level: Sustained Memory: Decreased short-term memory Following Commands: Follows one step commands inconsistently;Follows one step commands with increased time Safety/Judgement: Decreased awareness of safety;Decreased awareness of deficits Awareness: Intellectual   General Comments: required constant cueing througout session        Exercises     Shoulder  Instructions       General Comments O2 >90% on RA and HR <100 BPM after exertion.    Pertinent Vitals/ Pain       Pain Assessment: 0-10 Pain Score: 3  Faces Pain Scale: Hurts a little bit Pain Location: headache and LUE with stretching and ROM.   Pain Descriptors / Indicators: Discomfort;Headache Pain Intervention(s): Monitored during session  Home Living                                          Prior Functioning/Environment              Frequency  Min 3X/week        Progress Toward Goals  OT Goals(current goals can now be found in the care plan section)  Progress towards OT goals: Progressing toward goals  Acute Rehab OT Goals Patient Stated Goal: get home. OT Goal Formulation: With patient Time For Goal Achievement: 02/18/19 Potential to Achieve Goals: Good ADL Goals Pt Will Perform Grooming: with supervision;sitting Pt Will Perform Upper Body Dressing: with min assist;sitting Pt Will Perform Lower Body Dressing: with min assist;sit to/from stand Pt Will Transfer to Toilet: with min assist;stand pivot transfer Pt Will Perform Toileting - Clothing Manipulation and hygiene: with min assist;sit to/from stand Pt/caregiver will Perform Home Exercise Program: Increased ROM;Left upper extremity;With minimal assist Additional ADL Goal #1: Pt will follow 2-3 step commands 75% of the time during functional task. Additional ADL Goal #2: Pt will attend to L side of body/L environment during functional task with no more than min cues.  Plan Discharge plan remains appropriate;Frequency remains appropriate    Co-evaluation                 AM-PAC OT "6 Clicks" Daily Activity     Outcome Measure   Help from another person eating meals?: A Little Help from another person taking care of personal grooming?: A Little Help from another person toileting, which includes using toliet, bedpan, or urinal?: A Lot Help from another person bathing (including  washing, rinsing, drying)?: A Lot Help from another person to put on and taking off regular upper body clothing?: A Lot Help from another person to put on and taking off regular lower body clothing?: A Lot 6 Click Score: 14    End of Session Equipment Utilized During Treatment: Gait belt;Rolling walker(for RUE only)  OT Visit Diagnosis: Other abnormalities of gait and mobility (R26.89);Muscle weakness (generalized) (M62.81);Hemiplegia and hemiparesis Hemiplegia - Right/Left: Left Hemiplegia - dominant/non-dominant: Non-Dominant Hemiplegia - caused by: Cerebral infarction   Activity Tolerance Patient tolerated treatment well   Patient Left in chair;with call bell/phone within reach;with chair alarm set;with nursing/sitter in room   Nurse Communication Mobility status        Time: 1610-9604 OT Time Calculation (min): 21 min  Charges: OT General Charges $OT Visit: 1 Visit OT Treatments $Neuromuscular Re-education: 8-22 mins  Cristi Loron) Glendell Docker OTR/L Acute Rehabilitation Services Pager: [phone] Office:  [phone]    [name] 02/05/2019, 4:29 PM

## 2019-02-05 NOTE — Progress Notes (Signed)
   Subjective: [name] reports she is having a headache this morning. No other complaints at this time. She is amenable to SNF placement.   Objective:  Vital signs in last 24 hours: Vitals:   02/04/19 2033 02/05/19 0009 02/05/19 0342 02/05/19 0829  BP: 125/73 (!) 142/80 140/85 (!) 160/87  Pulse: 78 73 68 66  Resp: 17 18 18 17   Temp: 100.2 F (37.9 C) 98.8 F (37.1 C) 98.5 F (36.9 C) 98.3 F (36.8 C)  TempSrc: Oral Oral Oral Oral  SpO2: 97% 95% 96% 99%  Weight:      Height:       Physical Exam Gen: seen laying in bed, no distress CV: RRR, no murmurs Neuro: left sided facial droop, left UE paralysis, right UE strength 5/5, right LE strength 4/5, left LE strength 2/5, sensation intact bilaterally Ext: no edema, warm and well perfused   Assessment/Plan:  Active Problems:   CVA (cerebral vascular accident) (HCC)   Cocaine abuse (HCC)  [name] is a 63 yo female withHTN, hx of PE, schizophrenia, bipolar disorder,prior stroke with residual left arm paralysis and left leg weakness,andcocaine use presenting with a stroke.  CVA: Patient presented due to a headache and subsequent falls, found to have an acute on chronic cerebral infarction, nonhemorrhagic, right cerebral hemisphere. 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 Echo showed EF 55-60%, no regional wall abnormalities. Neuro recommending aspirin alone given enlarging left ICA aneurysm and CIR. - Telemetry - PT/OT recommending SNF - Aspirin 325 mg  - Atorvastatin 40mg    HTN: Per boyfriend, patient is on amlodipine 5 mg, will hold for now and monitor.   Cocaine Use: Last snorted cocaine 3 days ago and uses about every 3 days. She is interested in quitting. Will get social work on board to help with resources.  Schizophrenia with bipolar disorder: restarted risperidone 1 mg yesterday   Dispo: Anticipated discharge  pending SNF placement.   Jaci Standard, DO 02/05/2019, 9:21 AM Pager: [phone]

## 2019-02-05 NOTE — Progress Notes (Signed)
  Speech Language Pathology Treatment: Cognitive-Linquistic  Patient Details Name: [name] MRN: [id number] DOB: Jul 07, 1956 Today's Date: 02/05/2019 Time: 6962-9528 SLP Time Calculation (min) (ACUTE ONLY): 18 min  Assessment / Plan / Recommendation Clinical Impression  SLP targeted dysarthria and cognitive impairments with pt requiring moderate and verbal assist. Assisted pt to verbally problem solving situations that have occurred throughout her day. For instance, she reported requesting pain medicine this morning and not yet receiving. Pt demonstrated use of call bell with strategies to locate (find cord and follow with fingers) due to left neglect. SLP verbally cued pt to use increased volume when speaking with front desk which she demonstrated without difficulty. Problem solved locating buttons to turn on and manipulate tv with strategies to assist. She was independently oriented to month and was close to re: date and day week. Recommend continue ST for lower assist needed to intelligibly communicate and engage in functional activities.    HPI HPI: Patient is a 63 y.o. female with PMH; HTN, h/o PE, schizophrenia, bipolar disorder, prior CVA in 2012 with residual left arm paralysis and left leg weakness, cocaine abuse who presented to ED secondary to headache and multiple falls at home but without loss of consciousness per patient report. Her boyfriend has been assisting with her ADL's since CVA in 2012. Upon arrival to ED, patient was hypertensive and UDS was positive for cocaine, CT head revealed acute intracranial abnormality and extensive right cerebral hemispheric encephalomalacia related to old MCA and PCA territory infarcts. MRI showed acute on chronic cerebral infarction, nonhemorrhagic in right cerebral hemisphere.       SLP Plan  Continue with current plan of care       Recommendations                   Follow up Recommendations: 24 hour supervision/assistance SLP  Visit Diagnosis: Dysarthria and anarthria (R47.1);Cognitive communication deficit (U13.244) Plan: Continue with current plan of care       GO                [name] 02/05/2019, 4:15 PM   [name] [name].Ed Nurse, children's [phone] Office [phone]

## 2019-02-05 NOTE — Progress Notes (Signed)
Internal Medicine Teaching Service Attending:   I saw and examined the patient. I reviewed the resident's note and I agree with the resident's findings and plan as documented in the resident's note.  Active Problems:   CVA (cerebral vascular accident) [facility])   Carotid artery aneurysm (HCC)   Right carotid artery occlusion   Left hemiparesis (HCC)  63 year old person admitted with acute on chronic right sided cerebral nonhemorrhagic infarction causing worsening left hemiparesis.  She is stable today, only complaint is of a mild headache.  On neuro exam she has left sided weakness with increased muscle tone of the left upper and lower extremities.  Greatly appreciate neurology consultation.  Plan for secondary stroke prevention with full dose aspirin 325 mg daily, no Plavix due to the carotid artery aneurysm on the left side, high intensity statin with atorvastatin 40 mg daily.  Currently undergoing evaluation by inpatient rehabilitation to decide on best disposition.  Erlinda Hong, MD FACP

## 2019-02-05 NOTE — Progress Notes (Signed)
Inpatient Rehab Admissions:  Inpatient Rehab Consult received.  I met with patient at the bedside for rehabilitation assessment and to discuss goals and expectations of an inpatient rehab admission. No support at bedside due to current visitor restrictions.  Pt interested in an intensive rehab program prior to discharge home.  She states she lives with her significant other, who is home 24/7, and that she also has an aide 8 hrs/day 5 days/week.  She gave permission to call her significant other, Suezanne Jacquet, to confirm 24/7 at discharge.  I will contact him to see about discharge plan and follow up tomorrow.    Signed: Shann Medal, PT, DPT Admissions Coordinator [phone] 02/05/19  3:25 PM

## 2019-02-06 ENCOUNTER — Inpatient Hospital Stay (HOSPITAL_COMMUNITY)
Admission: RE | Admit: 2019-02-06 | Discharge: 2019-03-02 | DRG: 057 | Disposition: A | Discharge status: Skilled Nursing Facility | Payer: Medicare Other | Source: Intra-hospital | Attending: Physical Medicine & Rehabilitation | Admitting: Physical Medicine & Rehabilitation

## 2019-02-06 ENCOUNTER — Other Ambulatory Visit: Payer: Self-pay

## 2019-02-06 DIAGNOSIS — I6521 Occlusion and stenosis of right carotid artery: Secondary | ICD-10-CM | POA: Diagnosis present

## 2019-02-06 DIAGNOSIS — Z8249 Family history of ischemic heart disease and other diseases of the circulatory system: Secondary | ICD-10-CM

## 2019-02-06 DIAGNOSIS — F319 Bipolar disorder, unspecified: Secondary | ICD-10-CM | POA: Diagnosis present

## 2019-02-06 DIAGNOSIS — E871 Hypo-osmolality and hyponatremia: Secondary | ICD-10-CM | POA: Diagnosis present

## 2019-02-06 DIAGNOSIS — R7303 Prediabetes: Secondary | ICD-10-CM | POA: Diagnosis present

## 2019-02-06 DIAGNOSIS — G8110 Spastic hemiplegia affecting unspecified side: Secondary | ICD-10-CM

## 2019-02-06 DIAGNOSIS — K5900 Constipation, unspecified: Secondary | ICD-10-CM | POA: Diagnosis present

## 2019-02-06 DIAGNOSIS — I169 Hypertensive crisis, unspecified: Secondary | ICD-10-CM | POA: Diagnosis not present

## 2019-02-06 DIAGNOSIS — R296 Repeated falls: Secondary | ICD-10-CM | POA: Diagnosis present

## 2019-02-06 DIAGNOSIS — G8114 Spastic hemiplegia affecting left nondominant side: Secondary | ICD-10-CM

## 2019-02-06 DIAGNOSIS — R509 Fever, unspecified: Secondary | ICD-10-CM | POA: Diagnosis not present

## 2019-02-06 DIAGNOSIS — R569 Unspecified convulsions: Secondary | ICD-10-CM

## 2019-02-06 DIAGNOSIS — Z7902 Long term (current) use of antithrombotics/antiplatelets: Secondary | ICD-10-CM | POA: Diagnosis not present

## 2019-02-06 DIAGNOSIS — D696 Thrombocytopenia, unspecified: Secondary | ICD-10-CM | POA: Diagnosis present

## 2019-02-06 DIAGNOSIS — I5032 Chronic diastolic (congestive) heart failure: Secondary | ICD-10-CM | POA: Diagnosis present

## 2019-02-06 DIAGNOSIS — F17200 Nicotine dependence, unspecified, uncomplicated: Secondary | ICD-10-CM | POA: Diagnosis present

## 2019-02-06 DIAGNOSIS — I110 Hypertensive heart disease with heart failure: Secondary | ICD-10-CM | POA: Diagnosis present

## 2019-02-06 DIAGNOSIS — R0989 Other specified symptoms and signs involving the circulatory and respiratory systems: Secondary | ICD-10-CM

## 2019-02-06 DIAGNOSIS — I69322 Dysarthria following cerebral infarction: Secondary | ICD-10-CM

## 2019-02-06 DIAGNOSIS — E876 Hypokalemia: Secondary | ICD-10-CM

## 2019-02-06 DIAGNOSIS — I671 Cerebral aneurysm, nonruptured: Secondary | ICD-10-CM | POA: Diagnosis present

## 2019-02-06 DIAGNOSIS — G441 Vascular headache, not elsewhere classified: Secondary | ICD-10-CM | POA: Diagnosis present

## 2019-02-06 DIAGNOSIS — I10 Essential (primary) hypertension: Secondary | ICD-10-CM | POA: Diagnosis not present

## 2019-02-06 DIAGNOSIS — Z86711 Personal history of pulmonary embolism: Secondary | ICD-10-CM | POA: Diagnosis not present

## 2019-02-06 DIAGNOSIS — D72819 Decreased white blood cell count, unspecified: Secondary | ICD-10-CM | POA: Diagnosis present

## 2019-02-06 DIAGNOSIS — I63511 Cerebral infarction due to unspecified occlusion or stenosis of right middle cerebral artery: Secondary | ICD-10-CM | POA: Diagnosis present

## 2019-02-06 DIAGNOSIS — E785 Hyperlipidemia, unspecified: Secondary | ICD-10-CM | POA: Diagnosis present

## 2019-02-06 DIAGNOSIS — F1910 Other psychoactive substance abuse, uncomplicated: Secondary | ICD-10-CM | POA: Diagnosis present

## 2019-02-06 DIAGNOSIS — G40909 Epilepsy, unspecified, not intractable, without status epilepticus: Secondary | ICD-10-CM | POA: Diagnosis not present

## 2019-02-06 DIAGNOSIS — I69354 Hemiplegia and hemiparesis following cerebral infarction affecting left non-dominant side: Principal | ICD-10-CM

## 2019-02-06 DIAGNOSIS — E46 Unspecified protein-calorie malnutrition: Secondary | ICD-10-CM | POA: Diagnosis present

## 2019-02-06 DIAGNOSIS — E8809 Other disorders of plasma-protein metabolism, not elsewhere classified: Secondary | ICD-10-CM

## 2019-02-06 DIAGNOSIS — I5031 Acute diastolic (congestive) heart failure: Secondary | ICD-10-CM

## 2019-02-06 DIAGNOSIS — I639 Cerebral infarction, unspecified: Secondary | ICD-10-CM | POA: Diagnosis not present

## 2019-02-06 MED ORDER — ACETAMINOPHEN 160 MG/5ML PO SOLN: 650.0000 mg | ORAL | Status: DC | PRN

## 2019-02-06 MED ORDER — CARVEDILOL 3.125 MG PO TABS: 12.5000 mg | ORAL_TABLET | Freq: Two times a day (BID) | ORAL | Status: DC

## 2019-02-06 MED ORDER — PREGABALIN 75 MG PO CAPS
75.0000 mg | ORAL_CAPSULE | Freq: Every day | ORAL | Status: DC
  Administered 2019-02-07 – 2019-03-02 (×23): 75 mg via ORAL
  Filled 2019-02-06 (×23): qty 1

## 2019-02-06 MED ORDER — RISPERIDONE 1 MG PO TABS
1.0000 mg | ORAL_TABLET | Freq: Every day | ORAL | Status: DC
  Administered 2019-02-06 – 2019-03-01 (×24): 1 mg via ORAL
  Filled 2019-02-06 (×26): qty 1

## 2019-02-06 MED ORDER — SENNOSIDES-DOCUSATE SODIUM 8.6-50 MG PO TABS: 1.0000 | ORAL_TABLET | Freq: Every evening | ORAL | Status: DC | PRN

## 2019-02-06 MED ORDER — ASPIRIN 325 MG PO TABS
325.0000 mg | ORAL_TABLET | Freq: Every day | ORAL | Status: DC
  Administered 2019-02-07 – 2019-03-02 (×24): 325 mg via ORAL
  Filled 2019-02-06 (×24): qty 1

## 2019-02-06 MED ORDER — ASPIRIN 325 MG PO TABS: 325.0000 mg | ORAL_TABLET | Freq: Every day | ORAL | Status: DC

## 2019-02-06 MED ORDER — SORBITOL 70 % SOLN
30.0000 mL | Freq: Every day | Status: DC | PRN
  Administered 2019-02-26: 30 mL via ORAL
  Filled 2019-02-06: qty 30

## 2019-02-06 MED ORDER — ATORVASTATIN CALCIUM 40 MG PO TABS: 40.0000 mg | ORAL_TABLET | Freq: Every day | ORAL | Status: AC

## 2019-02-06 MED ORDER — ACETAMINOPHEN 650 MG RE SUPP: 650.0000 mg | RECTAL | Status: DC | PRN

## 2019-02-06 MED ORDER — ASPIRIN 300 MG RE SUPP
300.0000 mg | Freq: Every day | RECTAL | Status: DC
  Filled 2019-02-06 (×3): qty 1

## 2019-02-06 MED ORDER — ENOXAPARIN SODIUM 40 MG/0.4ML ~~LOC~~ SOLN: 40.0000 mg | SUBCUTANEOUS | Status: DC

## 2019-02-06 MED ORDER — ENOXAPARIN SODIUM 40 MG/0.4ML ~~LOC~~ SOLN
40.0000 mg | Freq: Every day | SUBCUTANEOUS | Status: DC
  Administered 2019-02-06 – 2019-03-01 (×24): 40 mg via SUBCUTANEOUS
  Filled 2019-02-06 (×24): qty 0.4

## 2019-02-06 MED ORDER — PANTOPRAZOLE SODIUM 40 MG PO TBEC
40.0000 mg | DELAYED_RELEASE_TABLET | Freq: Every day | ORAL | Status: DC
  Administered 2019-02-07 – 2019-03-02 (×23): 40 mg via ORAL
  Filled 2019-02-06 (×23): qty 1

## 2019-02-06 MED ORDER — ATORVASTATIN CALCIUM 40 MG PO TABS
40.0000 mg | ORAL_TABLET | Freq: Every day | ORAL | Status: DC
  Administered 2019-02-06 – 2019-03-01 (×24): 40 mg via ORAL
  Filled 2019-02-06 (×25): qty 1

## 2019-02-06 MED ORDER — ACETAMINOPHEN 325 MG PO TABS
650.0000 mg | ORAL_TABLET | ORAL | Status: DC | PRN
  Administered 2019-02-06 – 2019-02-21 (×25): 650 mg via ORAL
  Filled 2019-02-06 (×27): qty 2

## 2019-02-06 NOTE — TOC Initial Note (Signed)
Transition of Care [facility]) - Initial/Assessment Note    Patient Details  Name: [name] MRN: [id number] Date of Birth: [birth year]  Transition of [facility]) CM/SW Contact:    Kermit Balo, RN Phone Number: 02/06/2019, 10:28 AM  Clinical Narrative:                 Plan is for CIR. CM following.  Expected Discharge Plan: IP Rehab Facility Barriers to Discharge: Active Substance Use - Placement   Patient Goals and CMS Choice        Expected Discharge Plan and Services Expected Discharge Plan: IP Rehab Facility   Discharge Planning Services: CM Consult   Living arrangements for the past 2 months: Single Family Home(2 level but can stay on ground level)                          Prior Living Arrangements/Services Living arrangements for the past 2 months: Single Family Home(2 level but can stay on ground level) Lives with:: Significant Other Patient language and need for interpreter reviewed:: Yes(no needs) Do you feel safe going back to the place where you live?: Yes      Need for Family Participation in Patient Care: Yes (Comment)(24 hour supervision recommended) Care giver support system in place?: Yes (comment)(boyfriend states he is able to provide this surpevision) Current home services: DME(walker, wheelchair, shower chair) Criminal Activity/Legal Involvement Pertinent to Current Situation/Hospitalization: No - Comment as needed  Activities of Daily Living      Permission Sought/Granted                  Emotional Assessment Appearance:: Appears stated age Attitude/Demeanor/Rapport: Gracious Affect (typically observed): Accepting, Appropriate Orientation: : Oriented to Self, Oriented to Place, Oriented to  Time, Oriented to Situation   Psych Involvement: No (comment)  Admission diagnosis:  Fall Patient Active Problem List   Diagnosis Date Noted  . 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 Right carotid artery occlusion 02/05/2019  . Left  hemiparesis (HCC) 02/05/2019  . CVA (cerebral vascular accident) (HCC) 02/03/2019   PCP:  Lorenda Ishihara, MD Pharmacy:   [facility] [phone] [address], [place] - [address] [place] [address] Phone: [phone] Fax: [phone]     Social Determinants of Health (SDOH) Interventions    Readmission Risk Interventions No flowsheet data found.

## 2019-02-06 NOTE — Care Management Important Message (Signed)
Important Message  Patient Details  Name: [name] MRN: [id number] Date of Birth: Dec 26, 1955   Medicare Important Message Given:  Yes    Dorena Bodo 02/06/2019, 4:45 PM

## 2019-02-06 NOTE — PMR Pre-admission (Signed)
PMR Admission Coordinator Pre-Admission Assessment  Patient: [name] is an 63 y.o., female MRN: [id number] DOB: [birth year] Height: '5\' 2"'$  (157.5 cm) Weight: 61.2 kg  Insurance Information HMO: yes    PPO:      PCP:      IPA:      80/20:      OTHER:  PRIMARY: [employer] Medicare      Policy#: [id number]      Subscriber: patient CM Name: Elvin So      Phone#: [phone]     Fax#:  Pre-Cert#: [id number] with updates due to Dorthula Nettles on 02/12/2019 (phone [phone], fax [phone])      Employer:  Benefits:  Phone #: [phone]     Name:  Eff. Date: 11/12/2018     Deduct: $0      Out of Pocket Max: [phone] ($0 met)      Life Max: n/a CIR: $225/admission      SNF: $0/day for days 1-20, $176/day days 21-100 Outpatient: $0/visit, medical necessity     Co-Pay:  Home Health: 100% medical necessity      Co-Pay:  DME:  80%     Co-Pay: 20%  SECONDARY: Medicaid       Policy#: [id number]      Subscriber: patient CM Name:       Phone#:      Fax#:  Pre-Cert#: verified by phone 02/06/2019      Employer:  Benefits:  Phone #: [phone]     Name:  Eff. Date: Eligible as of 02/06/2019     Deduct:       Out of Pocket Max:       Life Max:  CIR:       SNF:  Outpatient:      Co-Pay:  Home Health:       Co-Pay:  DME:      Co-Pay:   Medicaid Application Date:       Case Manager:  Disability Application Date:       Case Worker:    Emergency Contact Information Contact Information    Name Relation Home Work Mobile   evans, Kaanapali Significant other [phone]        Current Medical History  Patient Admitting Diagnosis: R MCA CVA History of Present Illness: [name] is a 63 year old right handed female history of pulmonary emboli 2012, hypertension maintained on Norvasc 5 mg daily, Coreg 12.5 mg twice a day, schizophrenia, bipolar disorder maintained on Lexapro 10 mg daily, Risperdal 1 mg daily at bedtime as well as 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 Presented 02/04/2019 with headache,increased left side weakness and frequent falls.CT/MRI showed acute on chronic cerebral infarction, nonhemorrhagic, right cerebral hemisphere. Acute nonhemorrhagic infarction involves the basal ganglia, residual posterior temporal occipital lobe as well as regional white matter. 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 Echocardiogram with ejection fraction of 60% without embolus. Urine drug screen was positive for cocaine. Neurology follow-up currently maintained on aspirin 325 mg daily for CVA prophylaxis. Subcutaneous Lovenox for DVT prophylaxis.  Complete NIHSS TOTAL: 12  Patient's medical record from [facility] has been reviewed by the rehabilitation admission coordinator and physician.  Past Medical History  Past Medical History:  Diagnosis Date  . Blood transfusion without reported  diagnosis   . CHF (congestive heart failure) ([place])   . Hypertension   . Stroke [facility])     Family History   family history includes Hypertension in her father and mother.  Prior Rehab/Hospitalizations Has the patient had prior rehab or hospitalizations prior to admission? No  Has the patient had major surgery during 100 days prior to admission? No   Current Medications  Current Facility-Administered Medications:  .  [COMPLETED] sodium chloride 0.9 % bolus 500 mL, 500 mL, Intravenous, Once, Stopped at 02/03/19 1431 **FOLLOWED BY** 0.9 %  sodium chloride infusion, 100 mL/hr, Intravenous, Continuous, Guilloud, Hoyle Sauer, MD, Last Rate: 100 mL/hr at 02/06/19 0004, 100 mL/hr at 02/06/19 0004 .  acetaminophen (TYLENOL) tablet 650 mg, 650 mg, Oral, Q4H PRN, 650 mg at 02/05/19 2020 **OR** acetaminophen (TYLENOL) solution 650 mg, 650 mg, Per Tube, Q4H PRN **OR**  acetaminophen (TYLENOL) suppository 650 mg, 650 mg, Rectal, Q4H PRN, Velna Ochs, MD .  acetaminophen (TYLENOL) suppository 650 mg, 650 mg, Rectal, Once, Velna Ochs, MD .  aspirin suppository 300 mg, 300 mg, Rectal, Daily **OR** aspirin tablet 325 mg, 325 mg, Oral, Daily, Velna Ochs, MD, 325 mg at 02/06/19 1013 .  atorvastatin (LIPITOR) tablet 40 mg, 40 mg, Oral, q1800, Velna Ochs, MD, 40 mg at 02/05/19 1826 .  enoxaparin (LOVENOX) injection 40 mg, 40 mg, Subcutaneous, Q24H, Velna Ochs, MD, 40 mg at 02/05/19 1826 .  pantoprazole (PROTONIX) EC tablet 40 mg, 40 mg, Oral, Daily, Rehman, Areeg N, DO, 40 mg at 02/06/19 1013 .  pregabalin (LYRICA) capsule 75 mg, 75 mg, Oral, Daily, Rehman, Areeg N, DO, 75 mg at 02/06/19 1013 .  risperiDONE (RISPERDAL) tablet 1 mg, 1 mg, Oral, QHS, Rehman, Areeg N, DO, 1 mg at 02/05/19 2220 .  senna-docusate (Senokot-S) tablet 1 tablet, 1 tablet, Oral, QHS PRN, Velna Ochs, MD  Patients Current Diet:  Diet Order            Diet regular Room service appropriate? Yes with Assist; Fluid consistency: Thin  Diet effective now              Precautions / Restrictions Precautions Precautions: Fall Precaution Comments: LUE spasticity vs. tone able to achieve ROM Restrictions Weight Bearing Restrictions: No   Has the patient had 2 or more falls or a fall with injury in the past year? Yes  Prior Activity Level Household: per significant other, [name], pt rarely left the house due to difficulty with stairs  Prior Functional Level Self Care: Did the patient need help bathing, dressing, using the toilet or eating? Needed some help  Indoor Mobility: Did the patient need assistance with walking from room to room (with or without device)? Needed some help  Stairs: Did the patient need assistance with internal or external stairs (with or without device)? Needed some help  Functional Cognition: Did the patient need help planning  regular tasks such as shopping or remembering to take medications? Needed some help  Home Assistive Devices / Equipment Home Equipment: Cane - single point, Shower seat  Prior Device Use: Indicate devices/aids used by the patient prior to current illness, exacerbation or injury? cane  Current Functional Level Cognition  Arousal/Alertness: Awake/alert Overall Cognitive Status: No family/caregiver present to determine baseline cognitive functioning Current Attention Level: Sustained Orientation Level: Oriented X4 Following Commands: Follows one step commands inconsistently, Follows one step commands with increased time Safety/Judgement: Decreased awareness of safety, Decreased awareness of deficits General Comments: required constant cueing throughout session Attention: Focused Focused Attention:  Impaired Focused Attention Impairment: Verbal basic, Functional basic Memory: Appears intact Awareness: Impaired Awareness Impairment: Emergent impairment Problem Solving: Impaired Problem Solving Impairment: Functional basic, Verbal basic Executive Function: Self Monitoring Self Monitoring: Impaired Self Monitoring Impairment: Verbal basic, Functional basic Safety/Judgment: Impaired    Extremity Assessment (includes Sensation/Coordination)  Upper Extremity Assessment: Generalized weakness LUE Deficits / Details: able to passively stretch L elbow into extension, pt tolerates passive stretching to digits to approx 90*, stiff L scapula with attempts at shoulder ROM LUE Sensation: (denies decreased sensation)  Lower Extremity Assessment: Defer to PT evaluation RLE Deficits / Details: generally functional and strength>4/5 LLE Deficits / Details: did not move LLE LLE Sensation: (pt reports good sensation) LLE Coordination: decreased gross motor, decreased fine motor    ADLs  Overall ADL's : Needs assistance/impaired Eating/Feeding: Set up, Sitting Eating/Feeding Details (indicate cue type  and reason): drinking orange juice during session Grooming: Set up, Supervision/safety, Sitting, Wash/dry face, Oral care Grooming Details (indicate cue type and reason): seated in recliner; pt able to use L hand grossly to grip toothbrush while using R hand to rinse and spit into container Upper Body Bathing: Moderate assistance, Sitting Lower Body Bathing: Maximal assistance, Sit to/from stand, Sitting/lateral leans Upper Body Dressing : Moderate assistance, Sitting Upper Body Dressing Details (indicate cue type and reason): requires increased assist to readjust gown start of session Lower Body Dressing: Maximal assistance, Sit to/from stand, Sitting/lateral leans Lower Body Dressing Details (indicate cue type and reason): with external assist for sitting balance pt able to reach towards floor and pull sock back over L heel; increased effort and attempts to perform; encouragement to perform on her own Toilet Transfer: Maximal assistance, Squat-pivot Toilet Transfer Details (indicate cue type and reason): simulated via transfer to recliner Toileting- Clothing Manipulation and Hygiene: Maximal assistance, Sit to/from stand Functional mobility during ADLs: Maximal assistance, Moderate assistance, +2 for physical assistance, +2 for safety/equipment(stand pivot transfer) General ADL Comments: pt with L side weakness, inattention, impaired cognition; grooming task with set-upA    Mobility  Overal bed mobility: Needs Assistance Bed Mobility: Rolling, Sidelying to Sit Rolling: Mod assist, +2 for physical assistance Sidelying to sit: Mod assist, +2 for physical assistance Supine to sit: Max assist Sit to supine: Mod assist General bed mobility comments: seated EOB with PT upon arrival    Transfers  Overall transfer level: Needs assistance Equipment used: 2 person hand held assist Transfers: Sit to/from Stand, Stand Pivot Transfers Sit to Stand: Mod assist, +2 physical assistance, +2  safety/equipment Stand pivot transfers: Mod assist, Max assist, +2 physical assistance, +2 safety/equipment Squat pivot transfers: Max assist  Lateral/Scoot Transfers: Mod assist, +2 physical assistance(after failed attempt to pivot once in standing require +2 lateral scoot.  ) General transfer comment: assist to stand from EOB, increased cues and assist to facilitate upright posture; with assist to offwt L side pt able to minimally scoot L foot along floor, requires L knee block, +2 assist to take pivotal steps towards recliner    Ambulation / Gait / Stairs / Wheelchair Mobility  Ambulation/Gait Ambulation/Gait assistance: (NT unable to advance steps. )    Posture / Balance Dynamic Sitting Balance Sitting balance - Comments: pt able to maintain static sitting with close minguard assist, though often with L lateral lean, pt able to correct with intermittent minA Balance Overall balance assessment: Needs assistance Sitting-balance support: Single extremity supported, Feet supported Sitting balance-Leahy Scale: Poor Sitting balance - Comments: pt able to maintain static sitting with close minguard  assist, though often with L lateral lean, pt able to correct with intermittent minA Postural control: Left lateral lean Standing balance support: Single extremity supported, Bilateral upper extremity supported Standing balance-Leahy Scale: Poor Standing balance comment: reliant on external assist for standing balance    Special needs/care consideration BiPAP/CPAP no CPM no Continuous Drip IV 0.95 NACL Dialysis no        Days n/a Life Vest no Oxygen no Special Bed no Trach Size no Wound Vac (area) no      Location n/a Skin abrasion to L knee                               Bowel mgmt: incontinent, last BM 02/01/2019 Bladder mgmt: incontinent, foley Diabetic mgmt: no Behavioral consideration no Chemo/radiation no   Previous Home Environment (from acute therapy documentation) Living  Arrangements: Spouse/significant other(boyfriend, "[name]")  Lives With: Significant other Available Help at Discharge: Family, Friend(s), Personal care attendant, Available PRN/intermittently Type of Home: House Home Layout: One level Home Access: Stairs to enter Technical brewer of Steps: 15 Bathroom Shower/Tub: Multimedia programmer: Handicapped height Bathroom Accessibility: Yes How Accessible: Accessible via walker Additional Comments: may need to clarify additional DME and home setup - pt somewhat agitated with questioning this session  Discharge Living Setting Plans for Discharge Living Setting: Patient's home Type of Home at Discharge: House Discharge Home Layout: Two level, Bed/bath upstairs Alternate Level Stairs-Rails: Right Alternate Level Stairs-Number of Steps: 15 Discharge Home Access: Stairs to enter Entrance Stairs-Rails: Right(wall on the L) Entrance Stairs-Number of Steps: 1 Discharge Bathroom Shower/Tub: Walk-in shower Discharge Bathroom Toilet: Handicapped height Discharge Bathroom Accessibility: Yes How Accessible: Accessible via walker Does the patient have any problems obtaining your medications?: No  Social/Family/Support Systems Patient Roles: Partner Anticipated Caregiver: Suezanne Jacquet [phone], and hired caregivers Anticipated Caregiver's Contact Information: [phone] Ability/Limitations of Caregiver: Suezanne Jacquet can provide up to min assist Caregiver Availability: 24/7 Discharge Plan Discussed with Primary Caregiver: Yes Is Caregiver In Agreement with Plan?: Yes Does Caregiver/Family have Issues with Lodging/Transportation while Pt is in Rehab?: No  Goals/Additional Needs Patient/Family Goal for Rehab: PT/OT/SLP min assist Expected length of stay: 18-21 days Cultural Considerations: none Dietary Needs: none Equipment Needs: tbd Special Service Needs: none Pt/Family Agrees to Admission and willing to participate: Yes Program Orientation  Provided & Reviewed with Pt/Caregiver Including Roles  & Responsibilities: Yes Additional Information Needs: n/a Information Needs to be Provided By: n/a  Barriers to Discharge: Home environment access/layout  Barriers to Discharge Comments: pt's significant other reports bed is upstairs with no room for hospital bed on first level as of now.  May be able to arrange first floor set up if pt has trouble on the stairs  Possible need for SNF placement upon discharge: not anticipated  Patient Condition: I have reviewed medical records from [facility], spoken with CSW, CM, and patient, and significant other. I met with patient at the bedside and discussed with significant other via phone for inpatient rehabilitation assessment.  Patient will benefit from ongoing PT, OT, and SLP, can actively participate in 3 hours of therapy a day 5 days of the week, and can make measurable gains during the admission.  Patient will also benefit from the coordinated team approach during an Inpatient Acute Rehabilitation admission.  The patient will receive intensive therapy as well as Rehabilitation physician, nursing, social worker, and care management interventions.  Due to bowel management, bladder management, safety,  skin/wound care, disease management, medical administration, pain management, and patient education the patient requires 24 hour a day rehabilitation nursing.  The patient is currently mod to max +2 with mobility and basic ADLs.  Discharge setting and therapy post discharge at home with home health is anticipated.  Patient has agreed to participate in the Acute Inpatient Rehabilitation Program and will admit today.  Preadmission Screen Completed By:  Michel Santee, 02/06/2019 12:40 PM ______________________________________________________________________   Discussed status with Dr. Naaman Plummer on 02/06/19 at 12:59 PM and received approval for admission today.  Admission Coordinator:  Michel Santee, PT, time  12:59 PM Sudie Grumbling 02/06/19    Assessment/Plan: Diagnosis: right brain, cortical infarcts likely due to large vessel disease in setting of prior CVA 1. Does the need for close, 24 hr/day Medical supervision in concert with the patient's rehab needs make it unreasonable for this patient to be served in a less intensive setting? Yes 2. Co-Morbidities requiring supervision/potential complications: previous CVA's with left hemiparesis, spasticity, HTN, bipolar schizophrenia 3. Due to bladder management, bowel management, safety, skin/wound care, disease management, medication administration, pain management and patient education, does the patient require 24 hr/day rehab nursing? Yes 4. Does the patient require coordinated care of a physician, rehab nurse, PT (1-2 hrs/day, 5 days/week), OT (1-2 hrs/day, 5 days/week) and SLP (1-2 hrs/day, 5 days/week) to address physical and functional deficits in the context of the above medical diagnosis(es)? Yes Addressing deficits in the following areas: balance, endurance, locomotion, strength, transferring, bowel/bladder control, bathing, dressing, feeding, grooming, toileting, cognition, speech, swallowing and psychosocial support 5. Can the patient actively participate in an intensive therapy program of at least 3 hrs of therapy 5 days a week? Yes 6. The potential for patient to make measurable gains while on inpatient rehab is excellent 7. Anticipated functional outcomes upon discharge from inpatients are: min assist PT, min assist OT, min assist SLP 8. Estimated rehab length of stay to reach the above functional goals is: 18-21 days 9. Anticipated D/C setting: Home 10. Anticipated post D/C treatments: [place] therapy 11. Overall Rehab/Functional Prognosis: excellent  MD Signature: Meredith Staggers, MD, [facility] 02/06/2019

## 2019-02-06 NOTE — Progress Notes (Addendum)
Inpatient Rehab Admissions Coordinator:   I met with patient at bedside and discussed with her significant other, [name], via phone.  They continued to be interested in a CIR stay prior to returning home.  [name] reports between himself and pt's aids (47 hrs/week) she will have 24/7 assist and he is able to provide minimal physical assistance if needed.  I have insurance approval and will contact MD for possible rehab admission today.  Please contact me with questions.   Addendum: I have MD approval and will plan for rehab admission today.   Shann Medal, PT, DPT Admissions Coordinator [phone] 02/06/19  11:41 AM

## 2019-02-06 NOTE — H&P (Signed)
Physical Medicine and Rehabilitation Admission H&P    Chief Complaint  Patient presents with  . 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 Per chart review patient lives with significant other/[name] who provides 24/7 assist she also has a home health aid 8 hours a day 5 days a week.one level home with multiple steps to entry. Patient used a straight point cane prior to admission. Presented 02/04/2019 with headache,increased left side weakness and frequent falls.CT/MRI showed acute on chronic cerebral infarction, nonhemorrhagic, right cerebral hemisphere. Acute nonhemorrhagic infarction involves the basal ganglia, residual posterior temporal occipital lobe as well as regional white matter. 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 Echocardiogram with ejection fraction of 60% without embolus. Urine drug screen was positive for cocaine. Neurology follow-up currently maintained on aspirin 325 mg daily for CVA prophylaxis. Subcutaneous Lovenox for DVT prophylaxis. Tolerating a regular diet. Therapy evaluations completed with recommendations of physical medicine rehabilitation consult. Patient was admitted for a comprehensive rehabilitation program.  Review of Systems  Constitutional: Negative for chills and fever.  HENT: Negative for hearing loss.   Eyes: Negative for blurred vision and double vision.  Respiratory: Positive for shortness of breath.  Negative for cough.   Cardiovascular: Positive for chest pain and leg swelling. Negative for palpitations.  Gastrointestinal: Positive for constipation. Negative for heartburn and nausea.  Genitourinary: Negative for dysuria, flank pain and hematuria.  Musculoskeletal: Positive for joint pain.  Skin: Negative for rash.  Neurological: Positive for dizziness, sensory change, speech change, focal weakness and headaches.  Psychiatric/Behavioral: The patient has insomnia.        Bipolar disorder  All other systems reviewed and are negative.  Past Medical History:  Diagnosis Date  . Blood transfusion without reported diagnosis   . CHF (congestive heart failure) (HCC)   . Hypertension   . Stroke [facility])    Past Surgical History:  Procedure Laterality Date  . ABDOMINAL SURGERY     Family History  Problem Relation Age of Onset  . Hypertension Mother   . Hypertension Father    Social History:  reports that she has been smoking. She has never used smokeless tobacco. She reports that she does not drink alcohol or use drugs. Allergies: No Known Allergies Medications Prior to Admission  Medication Sig Dispense Refill  . amLODipine (NORVASC) 5 MG tablet Take 5 mg by mouth daily.    Marland Kitchen atorvastatin (LIPITOR) 20 MG tablet Take 20 mg by mouth daily.    . carvedilol (COREG) 12.5 MG tablet Take 12.5 mg by mouth 2 (two) times daily with a meal.    . clopidogrel (PLAVIX) 75 MG tablet Take 75 mg by mouth daily.    Marland Kitchen escitalopram (LEXAPRO) 10 MG tablet Take 10 mg by mouth daily.    . pantoprazole (PROTONIX) 40 MG tablet Take 40 mg by mouth daily.    . pregabalin (LYRICA) 75 MG capsule Take 75 mg by mouth 3 (three) times daily.    Marland Kitchen  ranitidine (ZANTAC) 150 MG tablet Take 150 mg by mouth 2 (two) times daily.    . risperiDONE (RISPERDAL) 1 MG tablet Take 1 mg by mouth at bedtime.    . topiramate (TOPAMAX) 25 MG tablet Take 25 mg by mouth 2 (two) times daily.      Drug Regimen Review Drug regimen was  reviewed and remains appropriate with no significant issues identified  Home: Home Living Family/patient expects to be discharged to:: Private residence Living Arrangements: Spouse/significant other(boyfriend, "[name]") Available Help at Discharge: Family, Friend(s), Personal care attendant, Available PRN/intermittently Type of Home: House Home Access: Stairs to enter Secretary/administrator of Steps: 15 Home Layout: One level Bathroom Shower/Tub: Health visitor: Handicapped height Home Equipment: Medical laboratory scientific officer - single point, Information systems manager Additional Comments: may need to clarify additional DME and home setup - pt somewhat agitated with questioning this session  Lives With: Significant other   Functional History: Prior Function Level of Independence: Needs assistance Gait / Transfers Assistance Needed: reports using SPC for mobility ADL's / Homemaking Assistance Needed: pt reports she has an aide who assists with ADL/iADL, pt unable to state how many days/wk aide comes, when asked if she comes every day pt reports yes, reports she is there "most of the day"   Functional Status:  Mobility: Bed Mobility Overal bed mobility: Needs Assistance Bed Mobility: Rolling, Sidelying to Sit Rolling: Mod assist, +2 for physical assistance Sidelying to sit: Mod assist, +2 for physical assistance Supine to sit: Max assist Sit to supine: Mod assist General bed mobility comments: up in recliner Transfers Overall transfer level: Needs assistance Equipment used: Rolling walker (2 wheeled) Transfers: Sit to/from Stand Sit to Stand: Max assist(sit to stand only) Squat pivot transfers: Max assist  Lateral/Scoot Transfers: Mod assist, +2 physical assistance(after failed attempt to pivot once in standing require +2 lateral scoot.  ) General transfer comment: Unable to advance L foot at all; pt leaning on recliner in standing, unable to full stand upright without MaxA Ambulation/Gait  Ambulation/Gait assistance: (NT unable to advance steps. )    ADL: ADL Overall ADL's : Needs assistance/impaired Eating/Feeding: Set up, Sitting Eating/Feeding Details (indicate cue type and reason): drinking orange juice during session Grooming: Set up, Sitting, Min guard Grooming Details (indicate cue type and reason): supported sitting Upper Body Bathing: Moderate assistance, Sitting Lower Body Bathing: Maximal assistance, Sit to/from stand, Sitting/lateral leans Upper Body Dressing : Moderate assistance, Sitting Upper Body Dressing Details (indicate cue type and reason): requires increased assist to readjust gown start of session Lower Body Dressing: Maximal assistance, Sit to/from stand, Sitting/lateral leans Toilet Transfer: Maximal assistance, Squat-pivot Toilet Transfer Details (indicate cue type and reason): simulated via transfer to recliner Toileting- Clothing Manipulation and Hygiene: Maximal assistance, Sit to/from stand Functional mobility during ADLs: Maximal assistance( sit to stand only x3) General ADL Comments: pt with L side weakness, inattention, impaired cognition; grooming task with set-upA  Cognition: Cognition Overall Cognitive Status: No family/caregiver present to determine baseline cognitive functioning Arousal/Alertness: Awake/alert Orientation Level: Oriented X4 Attention: Focused Focused Attention: Impaired Focused Attention Impairment: Verbal basic, Functional basic Memory: Appears intact Awareness: Impaired Awareness Impairment: Emergent impairment Problem Solving: Impaired Problem Solving Impairment: Functional basic, Verbal basic Executive Function: Self Monitoring Self Monitoring: Impaired Self Monitoring Impairment: Verbal basic, Functional basic Safety/Judgment: Impaired Cognition Arousal/Alertness: Awake/alert Behavior During Therapy: Flat affect Overall Cognitive Status: No family/caregiver present to determine baseline cognitive  functioning Area of Impairment: Attention, Memory, Safety/judgement, Awareness, Problem solving Current Attention Level: Sustained Memory:  Decreased short-term memory Following Commands: Follows one step commands inconsistently, Follows one step commands with increased time Safety/Judgement: Decreased awareness of safety, Decreased awareness of deficits Awareness: Intellectual General Comments: required constant cueing througout session  Physical Exam: Blood pressure (!) 148/86, pulse 70, temperature 98.2 F (36.8 C), temperature source Oral, resp. rate 16, height  (1.575 m), weight 61.2 kg, SpO2 100 %. Physical Exam  Constitutional: No distress.  HENT:  Head: Normocephalic.  Eyes: Pupils are equal, round, and reactive to light. EOM are normal.  Neck: Normal range of motion.  Cardiovascular: Normal rate and regular rhythm.  No murmur heard. Respiratory: Effort normal and breath sounds normal. No respiratory distress. She has no wheezes.  GI: Soft. She exhibits no distension. There is no abdominal tenderness.  Musculoskeletal:        General: No edema.  Neurological:  Patient is alert. Fair to limited insight and awareness.  Normal language. Speech slurred. Left central 7 and tongue deviation. Left inattention.  LUE and LLE 0/5. Flexion contractures left wrist/fingers/elbow, left heel cord. DTR's 2++ on left. Underlying tone 2-3/4. RUE 4 to 5/5. Senses pain in all 4.       Skin: Skin is warm.  Psychiatric:  Flat but pleasant    No results found for this or any previous visit (from the past 48 hour(s)). No results found.     Medical Problem List and Plan: 1.  Left side weakness secondary to multiple right brain infarcts adjacent to previous right MCA infarcts in the setting of right ICA acute on chronic occlusion/left ICA cavernous aneurysm and current cocaine use. Follow-up outpatient interventional radiology for ICA aneurysm  -admit to inpatient rehab   -WHO, PRAFO  ordered for left side 2.  Antithrombotics: -DVT/anticoagulation:  Subcutaneous Lovenox  -antiplatelet therapy: Aspirin 325 mg daily 3. Pain Management: Lyrica 75 mg daily, Tylenol as needed 4. Mood/bipolar disorder  -antipsychotic agents: Risperdal 1 mg daily at bedtime 5. Neuropsych: This patient is capable of making decisions on her own behalf. 6. Skin/Wound Care:  Routine skin checks 7. Fluids/Electrolytes/Nutrition:  Routine in and out's with follow-up chemistries 8. Permissive hypertension. Monitor with increased mobility. Patient on Norvasc 5 mg daily, Coreg 12.5 mg twice a day prior to admission. Resume as needed 9. Diastolic congestive heart failure. Monitor for any signs of fluid overload  -daily weights 10. History of tobacco/cocaine use. Provide counseling 11. Hyperlipidemia. Lipitor     Mcarthur Rossetti Angiulli, PA-C 02/06/2019

## 2019-02-06 NOTE — Progress Notes (Signed)
Physical Therapy Treatment Patient Details Name: [name] MRN: [id number] DOB: 08-19-1956 Today's Date: 02/06/2019    History of Present Illness  [name] is a 63 yo female with a HTN, hx of PE, schizophrenia, bipolar disorder prior stroke with residual left arm paralysis and left leg weakness, and cocaine use presenting with stroke symptoms, HA and falls.  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    PT Comments    Pt performed seated balance including core rotation and trunk flexion.  Pt continues to lack ability to balance her self in a seated position.  +2 assistance required to stand and pivot to recliner.  OT dovetailed session to address LUE needs.      Follow Up Recommendations  Supervision/Assistance - 24 hour;CIR     Equipment Recommendations  Other (comment)(TBD)    Recommendations for Other Services       Precautions / Restrictions Precautions Precautions: Fall Precaution Comments: LUE spasticity vs. tone able to achieve ROM Restrictions Weight Bearing Restrictions: No    Mobility  Bed Mobility Overal bed mobility: Needs Assistance Bed Mobility: Rolling;Sidelying to Sit Rolling: Max assist Sidelying to sit: Max assist       General bed mobility comments: Pt required max assistance to move to edge of bed and elevate trunk into sitting.  Performed seated balance tasks, including core rotation, trunk flexion and maintaining midline.  LOB noted posterior and to the L.    Transfers Overall transfer level: Needs assistance Equipment used: 2 person hand held assist Transfers: Sit to/from [employer] Sit to Stand: Mod assist;+2 physical assistance;+2 safety/equipment Stand pivot transfers: Mod assist;Max assist;+2 physical assistance;+2 safety/equipment       General transfer comment: assist to stand from EOB, increased cues and  assist to facilitate upright posture; with assist to offwt L side pt able to minimally scoot L foot along floor, requires L knee block, +2 assist to take pivotal steps towards recliner  Ambulation/Gait                 Stairs             Wheelchair Mobility    Modified Rankin (Stroke Patients Only)       Balance     Sitting balance-Leahy Scale: Poor Sitting balance - Comments: pt able to maintain static sitting with close minguard assist, though often with L lateral lean, pt able to correct with intermittent minA     Standing balance-Leahy Scale: Poor Standing balance comment: reliant on external assist for standing balance                            Cognition Arousal/Alertness: Awake/alert Behavior During Therapy: Flat affect Overall Cognitive Status: No family/caregiver present to determine baseline cognitive functioning Area of Impairment: Attention;Memory;Safety/judgement;Awareness;Problem solving                   Current Attention Level: Sustained Memory: Decreased short-term memory Following Commands: Follows one step commands inconsistently;Follows one step commands with increased time Safety/Judgement: Decreased awareness of safety;Decreased awareness of deficits Awareness: Intellectual Problem Solving: Slow processing;Difficulty sequencing;Requires verbal cues;Requires tactile cues General Comments: required constant cueing throughout session      Exercises      General Comments        Pertinent Vitals/Pain Pain Assessment: Faces Faces Pain Scale: Hurts little more Pain Location:  headache and LUE with stretching and ROM.   Pain Descriptors / Indicators: Discomfort;Headache Pain Intervention(s): Monitored during session;Repositioned    Home Living                      Prior Function            PT Goals (current goals can now be found in the care plan section) Acute Rehab PT Goals Patient Stated Goal: get  home. Potential to Achieve Goals: Fair Progress towards PT goals: Progressing toward goals    Frequency    Min 3X/week      PT Plan Current plan remains appropriate    Co-evaluation PT/OT/SLP Co-Evaluation/Treatment: (dovetailed session in which cross over took place during bed to chair transfer. )            AM-PAC PT "6 Clicks" Mobility   Outcome Measure  Help needed turning from your back to your side while in a flat bed without using bedrails?: A Lot Help needed moving from lying on your back to sitting on the side of a flat bed without using bedrails?: A Lot Help needed moving to and from a bed to a chair (including a wheelchair)?: A Lot Help needed standing up from a chair using your arms (e.g., wheelchair or bedside chair)?: A Lot Help needed to walk in hospital room?: Total Help needed climbing 3-5 steps with a railing? : Total 6 Click Score: 10    End of Session Equipment Utilized During Treatment: Gait belt Activity Tolerance: Patient tolerated treatment well Patient left: in bed;with call bell/phone within reach;with bed alarm set Nurse Communication: Mobility status PT Visit Diagnosis: Muscle weakness (generalized) (M62.81);Hemiplegia and hemiparesis;Other abnormalities of gait and mobility (R26.89) Hemiplegia - Right/Left: Left     Time: 7416-3845 PT Time Calculation (min) (ACUTE ONLY): 14 min  Charges:  $Therapeutic Activity: 8-22 mins                     Joycelyn Rua, PTA Acute Rehabilitation Services Pager [phone] Office [phone]     Kashon Kraynak Artis Delay 02/06/2019, 4:59 PM

## 2019-02-06 NOTE — TOC Transition Note (Signed)
Transition of Care [facility]) - CM/SW Discharge Note   Patient Details  Name: [name] MRN: [id number] Date of Birth: September 11, 1956  [facility] of [facility] CM/SW Contact:  Kermit Balo, RN Phone Number: 02/06/2019, 12:17 PM   Clinical Narrative:    Pt discharging to CIR today.   Final next level of care: IP Rehab Facility Barriers to Discharge: No Barriers Identified   Patient Goals and CMS Choice     Choice offered to / list presented to : Patient  Discharge Placement                       Discharge Plan and Services   Discharge Planning Services: CM Consult                      Social Determinants of Health (SDOH) Interventions     Readmission Risk Interventions No flowsheet data found.

## 2019-02-06 NOTE — Progress Notes (Signed)
Internal Medicine Teaching Service Attending:   I saw and examined the patient. I reviewed the resident's note and I agree with the resident's findings and plan as documented in the resident's note.  Active Problems:   CVA (cerebral vascular accident) [facility])   Carotid artery aneurysm (HCC)   Right carotid artery occlusion   Left hemiparesis [facility])  Hospital day #2 for this 63 year old person admitted with an acute on chronic right-sided cerebral infarction which has caused worsening of her left upper and lower extremity hemiparesis.  Stroke evaluation has shown acute occlusion of the right internal carotid artery, and a stable aneurysm of the left internal carotid artery which needs outpatient evaluation with neurovascular service.  We will continue medical therapy with full dose aspirin 325 mg and atorvastatin 40 mg daily.  Working on disposition planning to either CIR or skilled nursing facility, medically stable for discharge once that is arranged.  Erlinda Hong, MD FACP

## 2019-02-06 NOTE — Telephone Encounter (Signed)
Called and spoke with patient's boyfriend Sharla Kidney) to determine patient's premorbid status. Information from this call in SLP's Speech-Language Evaluation note.   Angela Nevin, MA, CCC-SLP 02/06/19 11:00 AM

## 2019-02-06 NOTE — Discharge Summary (Addendum)
Name: [name] MRN: [id number] DOB: 06-25-1956 63 y.o. PCP: Lorenda Ishihara, MD  Date of Admission: 02/03/2019 11:39 AM Date of Discharge: 02/06/2019 Attending Physician: Tyson Alias, *  Discharge Diagnosis: 1. CVA 2. Carotid artery aneurysm 3. Right carotid artery occlusion 4. Left hemiparesis  5. HTN  Discharge Medications: Allergies as of 02/06/2019   No Known Allergies     Medication List    STOP taking these medications   amLODipine 5 MG tablet Commonly known as:  NORVASC   clopidogrel 75 MG tablet Commonly known as:  PLAVIX     TAKE these medications   aspirin 325 MG tablet Take 1 tablet (325 mg total) by mouth daily. Start taking on:  February 07, 2019   atorvastatin 40 MG tablet Commonly known as:  LIPITOR Take 1 tablet (40 mg total) by mouth daily at 6 PM. What changed:    medication strength  how much to take  when to take this   carvedilol 3.125 MG tablet Commonly known as:  COREG Take 4 tablets (12.5 mg total) by mouth 2 (two) times daily with a meal. What changed:  medication strength   escitalopram 10 MG tablet Commonly known as:  LEXAPRO Take 10 mg by mouth daily.   pantoprazole 40 MG tablet Commonly known as:  PROTONIX Take 40 mg by mouth daily.   pregabalin 75 MG capsule Commonly known as:  LYRICA Take 75 mg by mouth 3 (three) times daily.   ranitidine 150 MG tablet Commonly known as:  ZANTAC Take 150 mg by mouth 2 (two) times daily.   risperiDONE 1 MG tablet Commonly known as:  RISPERDAL Take 1 mg by mouth at bedtime.   topiramate 25 MG tablet Commonly known as:  TOPAMAX Take 25 mg by mouth 2 (two) times daily.       Disposition and follow-up:   [name] was discharged from [facility] in Stable condition.  At the hospital follow up visit please address:  1.  HTN- patient's home meds include amlodipine 5 mg and carvedilol 12.5 mg bid; held amlodipine and restarted a lower  dose of carvedilol, 3.125 mg bid. If BP continues to rise, systolic >150 consider restarting amlodipine and/or increasing carvedilol dose   2.  Labs / imaging needed at time of follow-up: none  3.  Pending labs/ test needing follow-up: none  Follow-up Appointments: Follow-up Information    [facility]. Schedule an appointment as soon as possible for a visit in 4 week(s).   Specialty:  Neurology Contact information: [address] [place] [address] [phone]         Will need to follow up with PCP in 1-2 weeks.  Hospital Course by problem list: 1. CVA- Patient presented due to a headache and subsequent falls, found to have an acute on chronic right cerebral infarction, nonhemorrhagic, right cerebral hemisphere. She had residual left UE paralysis from a stroke in 2012 and some left LE weakness that worsened due to this stroke. Neurology recommended to continue aspirin alone given enlarging left ICA aneurysm and CIR. Continued on aspirin 325 mg and lipitor 40 mg daily.  2. Carotid artery aneurysm- referred to neurosurgery at discharge for enlarging left ICA aneurysm and continued on aspirin 325 mg alone.  3. Right carotid artery occlusion-  CTA head and neck showed that the right internal carotid artery central filling defect within the ICA bulb, appears to be an thrombotic or embolic occlusion. Discussed  with neurology who suspect this is chronic and will medically manage.  4. Left hemiparesis - see above  5. HTN- patient's home meds include amlodipine 5 mg and carvedilol 12.5 mg bid; held amlodipine and restarted a lower dose of carvedilol, 3.125 mg bid. If BP continues to rise, systolic >150 consider restarting amlodipine and/or increasing carvedilol dose   Discharge Vitals:   BP (!) 150/86 (BP Location: Left Arm)    Pulse 87    Temp 98.1 F (36.7 C) (Oral)    Resp 16    Ht  (1.575 m)    Wt 61.2 kg    SpO2 99%    BMI 24.69 kg/m    Pertinent Labs, Studies, and Procedures:   CMP Latest Ref Rng & Units 02/03/2019 02/03/2019 06/06/2011  Glucose 70 - 99 mg/dL - 161(W) 960(A)  BUN 8 - 23 mg/dL - 11 19  Creatinine 5.40 - 1.00 mg/dL 9.81 1.91(Y) 7.82  Sodium 135 - 145 mmol/L - 141 140  Potassium 3.5 - 5.1 mmol/L - 3.7 4.0  Chloride 98 - 111 mmol/L - 116(H) 103  CO2 22 - 32 mmol/L - 22 30  Calcium 8.9 - 10.3 mg/dL - 9.0 9.5  Total Protein 6.5 - 8.1 g/dL - 7.0 6.8  Total Bilirubin 0.3 - 1.2 mg/dL - 0.5 9.5(A)  Alkaline Phos 38 - 126 U/L - 63 59  AST 15 - 41 U/L - 19 35  ALT 0 - 44 U/L - 10 10   CBC Latest Ref Rng & Units 02/03/2019 06/06/2011 05/28/2011  WBC 4.0 - 10.5 K/uL 4.9 4.2 4.4  Hemoglobin 12.0 - 15.0 g/dL 21.3 08.6 11.9(L)  Hematocrit 36.0 - 46.0 % 47.0(H) 36.8 36.5  Platelets 150 - 400 K/uL 170 99(L) 142(L)   Ct Angio Head W Or Wo Contrast  Result Date: 02/03/2019 CLINICAL DATA:  Focal neurological deficit. Falling. Acute infarction on the right. EXAM: CT ANGIOGRAPHY HEAD AND NECK TECHNIQUE: Multidetector CT imaging of the head and neck was performed using the standard protocol during bolus administration of intravenous contrast. Multiplanar CT image reconstructions and MIPs were obtained to evaluate the vascular anatomy. Carotid stenosis measurements (when applicable) are obtained utilizing NASCET criteria, using the distal internal carotid diameter as the denominator. CONTRAST:  OMNIPAQUE IOHEXOL 350 MG/ML SOLN COMPARISON:  None. FINDINGS: CT HEAD FINDINGS Brain: No visible change since earlier today. Extensive chronic ischemic change throughout the right hemisphere with atrophy, encephalomalacia, gliosis and porencephaly. The acute infarction demonstrated in the right thalamus/basal ganglia and parieto-occipital brain can not be specifically appreciated on the CT. Widespread changes of chronic small vessel disease again evident throughout the hemispheric white matter. Chronic small-vessel ischemic changes  throughout the pons. Vascular: Negative Skull: Negative Sinuses: Clear Orbits: Negative Review of the MIP images confirms the above findings CTA NECK FINDINGS Aortic arch: Aortic atherosclerosis. No aneurysm or dissection. Calcification at the brachiocephalic vessel origins but no flow limiting stenosis. Right carotid system: Common carotid artery widely patent to the bifurcation. Acute appearing occlusion of the right internal carotid artery with what appears to be luminal clock with its crescent of surrounding contrast. I do not definitely establish antegrade flow extending to the skull base and brain. Left carotid system: Common carotid artery is widely patent to the bifurcation. There is calcification in the ICA bulb, but without stenosis compared to the more distal cervical ICA. Vertebral arteries: Both vertebral artery origins are widely patent. Skeleton: Ordinary cervical spondylosis. Other neck: No mass or lymphadenopathy. Upper chest:  Scarring in the upper lobes. Review of the MIP images confirms the above findings CTA HEAD FINDINGS Anterior circulation: Left internal carotid artery is patent through the skull base and siphon region. There is some atherosclerotic calcification in the carotid siphon but no stenosis greater than 30%. No antegrade flow in the right carotid siphon. Left internal carotid artery supplies the left middle cerebral artery territory, both anterior cerebral artery territories in the right middle cerebral artery territory through patent communicating arteries. 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 Posterior circulation: Both vertebral arteries are patent to the basilar. No basilar stenosis. Posterior circulation branch vessels are patent. Patent posterior communicating artery on the left. Venous sinuses: Patent and normal. Anatomic variants: None other significant. Delayed phase: No abnormal enhancement. Review  of the MIP images confirms the above findings IMPRESSION: I believe that the right internal carotid artery is likely to be acute based on the appearance at CTA. There is a central filling defect within the ICA bulb with some surrounding contrast, and complete occlusion of the mid to distal cervical ICA. This looks like an acute thrombotic or embolic occlusion. There is no antegrade flow in the right carotid siphon. Atherosclerotic calcification at the left ICA bulb but without stenosis. Left ICA patent through the skull base and siphon region, supplying the anterior circulation on both sides. 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 This has enlarged since 2012. Specialist evaluation of this would be warranted at some point. Electronically Signed   By: Mark  Shogry M.D.   On: 02/03/2019 18:36   Ct Head Wo Contrast  Result Date: 02/03/2019 CLINICAL DATA:  Acute headache.  Two falls at home. EXAM: CT HEAD WITHOUT CONTRAST TECHNIQUE: Contiguous axial images were obtained from the base of the skull through the vertex without intravenous contrast. COMPARISON:  MR brain dated February 14, 2012. CT head dated October 26, 2011. FINDINGS: Brain: No evidence of acute infarction, hemorrhage, hydrocephalus, extra-axial collection or mass lesion/mass effect. Extensive right cerebral hemispheric encephalomalacia related to old large MCA and PCA territory infarct, with prominent ex vacuo dilatation of the right lateral ventricle. Progressive moderate to severe chronic microvascular ischemic changes. Vascular: No hyperdense vessel or unexpected calcification. Skull: Normal. Negative for fracture or focal lesion. Sinuses/Orbits: No acute finding. Other: None. IMPRESSION: 1.  No acute intracranial abnormality. 2. Extensive right cerebral hemispheric encephalomalacia related to old large MCA and PCA territory infarcts. Electronically Signed   By: William T Derry M.D.    On: 02/03/2019 13:06   Ct Angio Neck W Or Wo Contrast  Result Date: 02/03/2019 CLINICAL DATA:  Focal neurological deficit. Falling. Acute infarction on the right. EXAM: CT ANGIOGRAPHY HEAD AND NECK TECHNIQUE: Multidetector CT imaging of the head and neck was performed using the standard protocol during bolus administration of intravenous contrast. Multiplanar CT image reconstructions and MIPs were obtained to evaluate the vascular anatomy. Carotid stenosis measurements (when applicable) are obtained utilizing NASCET criteria, using the distal internal carotid diameter as the denominator. CONTRAST:  <MEASUREMENT<MEASUREMEN J([id number] ArnIOHEXOL 350 MG/ML SOLN COMPARISON:  None. FINDINGS: CT HEAD FINDINGS Brain: No visible change since earlier today. Extensive chronic ischemic change throughout the right hemisphere with atrophy, encephalomalacia, gliosis and porencephaly. The acute infarction demonstrated in the right thalamus/basal ganglia and parieto-occipital brain can not be specifically appreciated on the CT. Widespread changes of chronic small vessel disease  again evident throughout the hemispheric white matter. Chronic small-vessel ischemic changes throughout the pons. Vascular: Negative Skull: Negative Sinuses: Clear Orbits: Negative Review of the MIP images confirms the above findings CTA NECK FINDINGS Aortic arch: Aortic atherosclerosis. No aneurysm or dissection. Calcification at the brachiocephalic vessel origins but no flow limiting stenosis. Right carotid system: Common carotid artery widely patent to the bifurcation. Acute appearing occlusion of the right internal carotid artery with what appears to be luminal clock with its crescent of surrounding contrast. I do not definitely establish antegrade flow extending to the skull base and brain. Left carotid system: Common carotid artery is widely patent to the bifurcation. There is calcification in the ICA bulb, but without stenosis compared to the more distal cervical  ICA. Vertebral arteries: Both vertebral artery origins are widely patent. Skeleton: Ordinary cervical spondylosis. Other neck: No mass or lymphadenopathy. Upper chest: Scarring in the upper lobes. Review of the MIP images confirms the above findings CTA HEAD FINDINGS Anterior circulation: Left internal carotid artery is patent through the skull base and siphon region. There is some atherosclerotic calcification in the carotid siphon but no stenosis greater than 30%. No antegrade flow in the right carotid siphon. Left internal carotid artery supplies the left middle cerebral artery territory, both anterior cerebral artery territories in the right middle cerebral artery territory through patent communicating arteries. 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 Posterior circulation: Both vertebral arteries are patent to the basilar. No basilar stenosis. Posterior circulation branch vessels are patent. Patent posterior communicating artery on the left. Venous sinuses: Patent and normal. Anatomic variants: None other significant. Delayed phase: No abnormal enhancement. Review of the MIP images confirms the above findings IMPRESSION: I believe that the right internal carotid artery is likely to be acute based on the appearance at CTA. There is a central filling defect within the ICA bulb with some surrounding contrast, and complete occlusion of the mid to distal cervical ICA. This looks like an acute thrombotic or embolic occlusion. There is no antegrade flow in the right carotid siphon. Atherosclerotic calcification at the left ICA bulb but without stenosis. Left ICA patent through the skull base and siphon region, supplying the anterior circulation on both sides. 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 This has enlarged since 2012. Specialist evaluation of this would be warranted at  some point. Electronically Signed   By: Paulina Fusi M.D.   On: 02/03/2019 18:36   Mr Brain Wo Contrast  Result Date: 02/03/2019 CLINICAL DATA:  Increased falls.  Previous CVA. EXAM: MRI HEAD WITHOUT CONTRAST TECHNIQUE: Multiplanar, multiecho pulse sequences of the brain and surrounding structures were obtained without intravenous contrast. COMPARISON:  CT head earlier today.  Prior MR 02/14/2012. FINDINGS: Brain: Large area of encephalomalacia RIGHT hemisphere, marked compensatory enlargement of the ventricle, RIGHT lateral ventricle consistent with a nearly holohemispheric MCA/PCA territory insult. This appearance is stable from 2013. New area of restricted diffusion corresponding low ADC, in the RIGHT hemisphere involves the residual medial posterior temporal and occipital lobe, RIGHT thalamus, RIGHT basal ganglia and regional white matter, consistent with acute infarction. No features to suggest acute hemorrhage. Generalized atrophy. Extensive small vessel disease throughout the white matter, more pronounced on the LEFT due to more visible brain substance. Extensive chronic microvascular ischemic changes also observed in the pons. Vascular: There is absent flow void in  the RIGHT petrous and cavernous ICA. It is unclear if this is acute or chronic. Supraclinoid RICA is patent, as is the RIGHT MCA, presumably collateral flow. Skull and upper cervical spine: Normal marrow signal. Sinuses/Orbits: Negative. Other: None. IMPRESSION: Acute on chronic cerebral infarction, nonhemorrhagic, RIGHT cerebral hemisphere. Acute nonhemorrhagic infarction involves the basal ganglia, residual posterior temporal and occipital lobe, as well as regional white matter. There is absent flow in the skull base RIGHT internal carotid artery. While recent RIGHT ICA thrombosis may have been contributory to the acute infarction, there are no specific imaging features which would allow an estimate of acuity versus chronicity. Extensive  chronic microvascular ischemic change, likely hypertensive related. Electronically Signed   By: Elsie Stain M.D.   On: 02/03/2019 16:21   Discharge Instructions: Discharge Instructions    Ambulatory referral to Neurology   Complete by:  As directed    Follow up with stroke clinic NP (Jessica Vanschaick or Darrol Angel, if both not available, consider Manson Allan, or [facility]) at [facility] in about 4 weeks. Thanks.   Ambulatory referral to Neurosurgery   Complete by:  As directed    Enlarging ICA aneurysm   Diet - low sodium heart healthy   Complete by:  As directed    Discharge instructions   Complete by:  As directed    Ms. Rie, [place]  You were hospitalized due to a stroke. I want you to continue taking aspirin 325 mg daily and increase your Lipitor to 40 mg daily. I have stopped your plavix.  I have also decreased your Coreg dose to 3.125 mg twice a day and want you to stop taking amlodipine for now. If your blood pressure continues to rise about 150 then I want you to increase your Coreg back to 12.5 mg twice a day and add your amlodipine 5 mg back.   Thank you for allowing [place] to be a part of your care!   Increase activity slowly   Complete by:  As directed       Signed: Devlon Dosher N, DO 02/06/2019, 2:50 PM

## 2019-02-06 NOTE — Progress Notes (Signed)
Meredith Staggers, MD  Physician  Physical Medicine and Rehabilitation  PMR Pre-admission  Signed  Date of Service:  02/06/2019 12:40 PM       Related encounter: ED to Hosp-Admission (Current) from 02/03/2019 in [facility]      Signed         Show:Clear all [x] Manual[x] Template[x] Copied  Added by: [x] Meredith Staggers, MD[x] Michel Santee, PT  [] Hover for details PMR Admission Coordinator Pre-Admission Assessment  Patient: NAQUISHA WHITEHAIR is an 63 y.o., female MRN: [id number] DOB: 1956-06-24 Height: 5' 2"  (157.5 cm) Weight: 61.2 kg  Insurance Information HMO: yes    PPO:      PCP:      IPA:      80/20:      OTHER:  PRIMARY: [employer] Medicare      Policy#: [id number]      Subscriber: patient CM Name: Elvin So      Phone#: [phone]     Fax#:  Pre-Cert#: [id number] with updates due to Dorthula Nettles on 02/12/2019 (phone [phone], fax [phone])      Employer:  Benefits:  Phone #: [phone]     Name:  Eff. Date: 11/12/2018     Deduct: $0      Out of Pocket Max: [phone] ($0 met)      Life Max: n/a CIR: $225/admission      SNF: $0/day for days 1-20, $176/day days 21-100 Outpatient: $0/visit, medical necessity     Co-Pay:  Home Health: 100% medical necessity      Co-Pay:  DME:  80%     Co-Pay: 20%  SECONDARY: Medicaid       Policy#: [id number]      Subscriber: patient CM Name:       Phone#:      Fax#:  Pre-Cert#: verified by phone 02/06/2019      Employer:  Benefits:  Phone #: [phone]     Name:  Eff. Date: Eligible as of 02/06/2019     Deduct:       Out of Pocket Max:       Life Max:  CIR:       SNF:  Outpatient:      Co-Pay:  Home Health:       Co-Pay:  DME:      Co-Pay:   Medicaid Application Date:       Case Manager:  Disability Application Date:       Case Worker:    Emergency Contact Information         Contact Information    Name Relation Home Work Mobile   evans, Mancos Significant other [phone]         Current Medical History  Patient Admitting Diagnosis: R MCA CVA History of Present Illness: KANDI BRUSSEAU is a 63 year old right handed female history of pulmonary emboli 2012, hypertension maintained on Norvasc 5 mg daily, Coreg 12.5 mg twice a day, schizophrenia, bipolar disorder maintained on Lexapro 10 mg daily, Risperdal 1 mg daily at bedtime as well as 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 Presented 02/04/2019 with headache,increased left side weakness and frequent falls.CT/MRI showed acute on chronic cerebral infarction, nonhemorrhagic, right cerebral hemisphere. Acute nonhemorrhagic infarction involves the basal ganglia, residual posterior temporal occipital lobe as well as regional white matter. 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 Echocardiogram with ejection fraction of 60% without embolus. Urine drug screen was positive for cocaine. Neurology follow-up currently maintained on aspirin 325 mg daily for CVA prophylaxis. Subcutaneous Lovenox for DVT prophylaxis.  Complete NIHSS TOTAL: 12  Patient's medical record from [facility] has been reviewed by the rehabilitation admission coordinator and physician.  Past Medical History      Past Medical History:  Diagnosis Date  . Blood transfusion without reported diagnosis   . CHF (congestive heart failure) ([place])   . Hypertension   . Stroke [facility])     Family History   family history includes Hypertension in her father and mother.  Prior Rehab/Hospitalizations Has the patient had prior rehab or hospitalizations prior to admission? No  Has the patient had major surgery during 100 days prior to admission? No              Current Medications  Current Facility-Administered  Medications:  .  [COMPLETED] sodium chloride 0.9 % bolus 500 mL, 500 mL, Intravenous, Once, Stopped at 02/03/19 1431 **FOLLOWED BY** 0.9 %  sodium chloride infusion, 100 mL/hr, Intravenous, Continuous, Guilloud, Hoyle Sauer, MD, Last Rate: 100 mL/hr at 02/06/19 0004, 100 mL/hr at 02/06/19 0004 .  acetaminophen (TYLENOL) tablet 650 mg, 650 mg, Oral, Q4H PRN, 650 mg at 02/05/19 2020 **OR** acetaminophen (TYLENOL) solution 650 mg, 650 mg, Per Tube, Q4H PRN **OR** acetaminophen (TYLENOL) suppository 650 mg, 650 mg, Rectal, Q4H PRN, Velna Ochs, MD .  acetaminophen (TYLENOL) suppository 650 mg, 650 mg, Rectal, Once, Velna Ochs, MD .  aspirin suppository 300 mg, 300 mg, Rectal, Daily **OR** aspirin tablet 325 mg, 325 mg, Oral, Daily, Velna Ochs, MD, 325 mg at 02/06/19 1013 .  atorvastatin (LIPITOR) tablet 40 mg, 40 mg, Oral, q1800, Velna Ochs, MD, 40 mg at 02/05/19 1826 .  enoxaparin (LOVENOX) injection 40 mg, 40 mg, Subcutaneous, Q24H, Velna Ochs, MD, 40 mg at 02/05/19 1826 .  pantoprazole (PROTONIX) EC tablet 40 mg, 40 mg, Oral, Daily, Rehman, Areeg N, DO, 40 mg at 02/06/19 1013 .  pregabalin (LYRICA) capsule 75 mg, 75 mg, Oral, Daily, Rehman, Areeg N, DO, 75 mg at 02/06/19 1013 .  risperiDONE (RISPERDAL) tablet 1 mg, 1 mg, Oral, QHS, Rehman, Areeg N, DO, 1 mg at 02/05/19 2220 .  senna-docusate (Senokot-S) tablet 1 tablet, 1 tablet, Oral, QHS PRN, Velna Ochs, MD  Patients Current Diet:     Diet Order                  Diet regular Room service appropriate? Yes with Assist; Fluid consistency: Thin  Diet effective now               Precautions / Restrictions Precautions Precautions: Fall Precaution Comments: LUE spasticity vs. tone able to achieve ROM Restrictions Weight Bearing Restrictions: No   Has the patient had 2 or more falls or a fall with injury in the past year? Yes  Prior Activity Level Household: per significant other, [name], pt  rarely left the house due to difficulty with stairs  Prior Functional Level Self Care: Did the patient need help bathing, dressing, using the toilet or eating? Needed some help  Indoor Mobility: Did the patient need assistance with walking from room to room (with or without device)? Needed some help  Stairs: Did the patient need assistance with internal or external stairs (with or without device)? Needed some help  Functional Cognition: Did the patient need  help planning regular tasks such as shopping or remembering to take medications? Needed some help  Home Assistive Devices / Equipment Home Equipment: Cane - single point, Shower seat  Prior Device Use: Indicate devices/aids used by the patient prior to current illness, exacerbation or injury? cane  Current Functional Level Cognition  Arousal/Alertness: Awake/alert Overall Cognitive Status: No family/caregiver present to determine baseline cognitive functioning Current Attention Level: Sustained Orientation Level: Oriented X4 Following Commands: Follows one step commands inconsistently, Follows one step commands with increased time Safety/Judgement: Decreased awareness of safety, Decreased awareness of deficits General Comments: required constant cueing throughout session Attention: Focused Focused Attention: Impaired Focused Attention Impairment: Verbal basic, Functional basic Memory: Appears intact Awareness: Impaired Awareness Impairment: Emergent impairment Problem Solving: Impaired Problem Solving Impairment: Functional basic, Verbal basic Executive Function: Self Monitoring Self Monitoring: Impaired Self Monitoring Impairment: Verbal basic, Functional basic Safety/Judgment: Impaired    Extremity Assessment (includes Sensation/Coordination)  Upper Extremity Assessment: Generalized weakness LUE Deficits / Details: able to passively stretch L elbow into extension, pt tolerates passive stretching to digits to  approx 90*, stiff L scapula with attempts at shoulder ROM LUE Sensation: (denies decreased sensation)  Lower Extremity Assessment: Defer to PT evaluation RLE Deficits / Details: generally functional and strength>4/5 LLE Deficits / Details: did not move LLE LLE Sensation: (pt reports good sensation) LLE Coordination: decreased gross motor, decreased fine motor    ADLs  Overall ADL's : Needs assistance/impaired Eating/Feeding: Set up, Sitting Eating/Feeding Details (indicate cue type and reason): drinking orange juice during session Grooming: Set up, Supervision/safety, Sitting, Wash/dry face, Oral care Grooming Details (indicate cue type and reason): seated in recliner; pt able to use L hand grossly to grip toothbrush while using R hand to rinse and spit into container Upper Body Bathing: Moderate assistance, Sitting Lower Body Bathing: Maximal assistance, Sit to/from stand, Sitting/lateral leans Upper Body Dressing : Moderate assistance, Sitting Upper Body Dressing Details (indicate cue type and reason): requires increased assist to readjust gown start of session Lower Body Dressing: Maximal assistance, Sit to/from stand, Sitting/lateral leans Lower Body Dressing Details (indicate cue type and reason): with external assist for sitting balance pt able to reach towards floor and pull sock back over L heel; increased effort and attempts to perform; encouragement to perform on her own Toilet Transfer: Maximal assistance, Squat-pivot Toilet Transfer Details (indicate cue type and reason): simulated via transfer to recliner Toileting- Clothing Manipulation and Hygiene: Maximal assistance, Sit to/from stand Functional mobility during ADLs: Maximal assistance, Moderate assistance, +2 for physical assistance, +2 for safety/equipment(stand pivot transfer) General ADL Comments: pt with L side weakness, inattention, impaired cognition; grooming task with set-upA    Mobility  Overal bed mobility:  Needs Assistance Bed Mobility: Rolling, Sidelying to Sit Rolling: Mod assist, +2 for physical assistance Sidelying to sit: Mod assist, +2 for physical assistance Supine to sit: Max assist Sit to supine: Mod assist General bed mobility comments: seated EOB with PT upon arrival    Transfers  Overall transfer level: Needs assistance Equipment used: 2 person hand held assist Transfers: Sit to/from Stand, Stand Pivot Transfers Sit to Stand: Mod assist, +2 physical assistance, +2 safety/equipment Stand pivot transfers: Mod assist, Max assist, +2 physical assistance, +2 safety/equipment Squat pivot transfers: Max assist  Lateral/Scoot Transfers: Mod assist, +2 physical assistance(after failed attempt to pivot once in standing require +2 lateral scoot.  ) General transfer comment: assist to stand from EOB, increased cues and assist to facilitate upright posture; with assist to offwt L side  pt able to minimally scoot L foot along floor, requires L knee block, +2 assist to take pivotal steps towards recliner    Ambulation / Gait / Stairs / Wheelchair Mobility  Ambulation/Gait Ambulation/Gait assistance: (NT unable to advance steps. )    Posture / Balance Dynamic Sitting Balance Sitting balance - Comments: pt able to maintain static sitting with close minguard assist, though often with L lateral lean, pt able to correct with intermittent minA Balance Overall balance assessment: Needs assistance Sitting-balance support: Single extremity supported, Feet supported Sitting balance-Leahy Scale: Poor Sitting balance - Comments: pt able to maintain static sitting with close minguard assist, though often with L lateral lean, pt able to correct with intermittent minA Postural control: Left lateral lean Standing balance support: Single extremity supported, Bilateral upper extremity supported Standing balance-Leahy Scale: Poor Standing balance comment: reliant on external assist for standing balance     Special needs/care consideration BiPAP/CPAP no CPM no Continuous Drip IV 0.95 NACL Dialysis no        Days n/a Life Vest no Oxygen no Special Bed no Trach Size no Wound Vac (area) no      Location n/a Skin abrasion to L knee                               Bowel mgmt: incontinent, last BM 02/01/2019 Bladder mgmt: incontinent, foley Diabetic mgmt: no Behavioral consideration no Chemo/radiation no   Previous Home Environment (from acute therapy documentation) Living Arrangements: Spouse/significant other(boyfriend, "[name]")  Lives With: Significant other Available Help at Discharge: Family, Friend(s), Personal care attendant, Available PRN/intermittently Type of Home: House Home Layout: One level Home Access: Stairs to enter Technical brewer of Steps: 15 Bathroom Shower/Tub: Multimedia programmer: Handicapped height Bathroom Accessibility: Yes How Accessible: Accessible via walker Additional Comments: may need to clarify additional DME and home setup - pt somewhat agitated with questioning this session  Discharge Living Setting Plans for Discharge Living Setting: Patient's home Type of Home at Discharge: House Discharge Home Layout: Two level, Bed/bath upstairs Alternate Level Stairs-Rails: Right Alternate Level Stairs-Number of Steps: 15 Discharge Home Access: Stairs to enter Entrance Stairs-Rails: Right(wall on the L) Entrance Stairs-Number of Steps: 1 Discharge Bathroom Shower/Tub: Walk-in shower Discharge Bathroom Toilet: Handicapped height Discharge Bathroom Accessibility: Yes How Accessible: Accessible via walker Does the patient have any problems obtaining your medications?: No  Social/Family/Support Systems Patient Roles: Partner Anticipated Caregiver: Suezanne Jacquet [phone], and hired caregivers Anticipated Caregiver's Contact Information: [phone] Ability/Limitations of Caregiver: Suezanne Jacquet can provide up to min assist Caregiver Availability: 24/7  Discharge Plan Discussed with Primary Caregiver: Yes Is Caregiver In Agreement with Plan?: Yes Does Caregiver/Family have Issues with Lodging/Transportation while Pt is in Rehab?: No  Goals/Additional Needs Patient/Family Goal for Rehab: PT/OT/SLP min assist Expected length of stay: 18-21 days Cultural Considerations: none Dietary Needs: none Equipment Needs: tbd Special Service Needs: none Pt/Family Agrees to Admission and willing to participate: Yes Program Orientation Provided & Reviewed with Pt/Caregiver Including Roles  & Responsibilities: Yes Additional Information Needs: n/a Information Needs to be Provided By: n/a  Barriers to Discharge: Home environment access/layout  Barriers to Discharge Comments: pt's significant other reports bed is upstairs with no room for hospital bed on first level as of now.  May be able to arrange first floor set up if pt has trouble on the stairs  Possible need for SNF placement upon discharge: not anticipated  Patient Condition: I have reviewed  medical records from [facility], spoken with CSW, CM, and patient, and significant other. I met with patient at the bedside and discussed with significant other via phone for inpatient rehabilitation assessment.  Patient will benefit from ongoing PT, OT, and SLP, can actively participate in 3 hours of therapy a day 5 days of the week, and can make measurable gains during the admission.  Patient will also benefit from the coordinated team approach during an Inpatient Acute Rehabilitation admission.  The patient will receive intensive therapy as well as Rehabilitation physician, nursing, social worker, and care management interventions.  Due to bowel management, bladder management, safety, skin/wound care, disease management, medical administration, pain management, and patient education the patient requires 24 hour a day rehabilitation nursing.  The patient is currently mod to max +2 with mobility and basic ADLs.   Discharge setting and therapy post discharge at home with home health is anticipated.  Patient has agreed to participate in the Acute Inpatient Rehabilitation Program and will admit today.  Preadmission Screen Completed By:  Michel Santee, 02/06/2019 12:40 PM ______________________________________________________________________   Discussed status with Dr. Naaman Plummer on 02/06/19 at 12:59 PM and received approval for admission today.  Admission Coordinator:  Michel Santee, PT, time 12:59 PM Sudie Grumbling 02/06/19    Assessment/Plan: Diagnosis: right brain, cortical infarcts likely due to large vessel disease in setting of prior CVA 1. Does the need for close, 24 hr/day Medical supervision in concert with the patient's rehab needs make it unreasonable for this patient to be served in a less intensive setting? Yes 2. Co-Morbidities requiring supervision/potential complications: previous CVA's with left hemiparesis, spasticity, HTN, bipolar schizophrenia 3. Due to bladder management, bowel management, safety, skin/wound care, disease management, medication administration, pain management and patient education, does the patient require 24 hr/day rehab nursing? Yes 4. Does the patient require coordinated care of a physician, rehab nurse, PT (1-2 hrs/day, 5 days/week), OT (1-2 hrs/day, 5 days/week) and SLP (1-2 hrs/day, 5 days/week) to address physical and functional deficits in the context of the above medical diagnosis(es)? Yes Addressing deficits in the following areas: balance, endurance, locomotion, strength, transferring, bowel/bladder control, bathing, dressing, feeding, grooming, toileting, cognition, speech, swallowing and psychosocial support 5. Can the patient actively participate in an intensive therapy program of at least 3 hrs of therapy 5 days a week? Yes 6. The potential for patient to make measurable gains while on inpatient rehab is excellent 7. Anticipated functional outcomes upon discharge  from inpatients are: min assist PT, min assist OT, min assist SLP 8. Estimated rehab length of stay to reach the above functional goals is: 18-21 days 9. Anticipated D/C setting: Home 10. Anticipated post D/C treatments: [place] therapy 11. Overall Rehab/Functional Prognosis: excellent  MD Signature: Meredith Staggers, MD, [facility] 02/06/2019         Revision History

## 2019-02-06 NOTE — H&P (Signed)
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 Per chart review patient lives with significant other/[name] who provides 24/7 assist she also has a home health aid 8 hours a day 5 days a week.one level home with multiple steps to entry. Patient used a straight point cane prior to admission. Presented 02/04/2019 with headache,increased left side weakness and frequent falls.CT/MRI showed acute on chronic cerebral infarction, nonhemorrhagic, right cerebral hemisphere. Acute nonhemorrhagic infarction involves the basal ganglia, residual posterior temporal occipital lobe as well as regional white matter. 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 Echocardiogram with ejection fraction of 60% without embolus. Urine drug screen was positive for cocaine. Neurology follow-up currently maintained on aspirin 325 mg daily for CVA prophylaxis. Subcutaneous Lovenox for DVT prophylaxis. Tolerating a regular diet. Therapy evaluations completed with recommendations of physical medicine rehabilitation consult. Patient was admitted for a comprehensive rehabilitation program.   Review of Systems  Constitutional: Negative for chills and fever.  HENT: Negative for hearing loss.   Eyes: Negative for blurred vision and double vision.  Respiratory: Positive for shortness of  breath. Negative for cough.   Cardiovascular: Positive for chest pain and leg swelling. Negative for palpitations.  Gastrointestinal: Positive for constipation. Negative for heartburn and nausea.  Genitourinary: Negative for dysuria, flank pain and hematuria.  Musculoskeletal: Positive for joint pain.  Skin: Negative for rash.  Neurological: Positive for dizziness, sensory change, speech change, focal weakness and headaches.  Psychiatric/Behavioral: The patient has insomnia.        Bipolar disorder  All other systems reviewed and are negative.       Past Medical History:  Diagnosis Date   Blood transfusion without reported diagnosis     CHF (congestive heart failure) (HCC)     Hypertension     Stroke [facility])           Past Surgical History:  Procedure Laterality Date   ABDOMINAL SURGERY             Family History  Problem Relation Age of Onset   Hypertension Mother     Hypertension Father      Social History:  reports that she has been smoking. She has never used smokeless tobacco. She reports that she does not drink alcohol or use drugs. Allergies: No Known Allergies       Medications Prior to Admission  Medication Sig Dispense Refill   amLODipine (NORVASC) 5 MG tablet Take 5 mg by mouth daily.       atorvastatin (LIPITOR) 20 MG tablet Take 20 mg by mouth daily.       carvedilol (COREG) 12.5 MG tablet Take 12.5 mg by mouth 2 (two) times daily with a meal.       clopidogrel (PLAVIX) 75 MG tablet Take 75 mg by mouth daily.  escitalopram (LEXAPRO) 10 MG tablet Take 10 mg by mouth daily.       pantoprazole (PROTONIX) 40 MG tablet Take 40 mg by mouth daily.       pregabalin (LYRICA) 75 MG capsule Take 75 mg by mouth 3 (three) times daily.       ranitidine (ZANTAC) 150 MG tablet Take 150 mg by mouth 2 (two) times daily.       risperiDONE (RISPERDAL) 1 MG tablet Take 1 mg by mouth at bedtime.       topiramate (TOPAMAX) 25 MG tablet Take 25 mg by mouth 2 (two)  times daily.          Drug Regimen Review Drug regimen was reviewed and remains appropriate with no significant issues identified   Home: Home Living Family/patient expects to be discharged to:: Private residence Living Arrangements: Spouse/significant other(boyfriend, "[name]") Available Help at Discharge: Family, Friend(s), Personal care attendant, Available PRN/intermittently Type of Home: House Home Access: Stairs to enter Secretary/administrator of Steps: 15 Home Layout: One level Bathroom Shower/Tub: Health visitor: Handicapped height Home Equipment: Medical laboratory scientific officer - single point, Information systems manager Additional Comments: may need to clarify additional DME and home setup - pt somewhat agitated with questioning this session  Lives With: Significant other   Functional History: Prior Function Level of Independence: Needs assistance Gait / Transfers Assistance Needed: reports using SPC for mobility ADL's / Homemaking Assistance Needed: pt reports she has an aide who assists with ADL/iADL, pt unable to state how many days/wk aide comes, when asked if she comes every day pt reports yes, reports she is there "most of the day"    Functional Status:  Mobility: Bed Mobility Overal bed mobility: Needs Assistance Bed Mobility: Rolling, Sidelying to Sit Rolling: Mod assist, +2 for physical assistance Sidelying to sit: Mod assist, +2 for physical assistance Supine to sit: Max assist Sit to supine: Mod assist General bed mobility comments: up in recliner Transfers Overall transfer level: Needs assistance Equipment used: Rolling walker (2 wheeled) Transfers: Sit to/from Stand Sit to Stand: Max assist(sit to stand only) Squat pivot transfers: Max assist  Lateral/Scoot Transfers: Mod assist, +2 physical assistance(after failed attempt to pivot once in standing require +2 lateral scoot.  ) General transfer comment: Unable to advance L foot at all; pt leaning on recliner in standing, unable to  full stand upright without MaxA Ambulation/Gait Ambulation/Gait assistance: (NT unable to advance steps. )   ADL: ADL Overall ADL's : Needs assistance/impaired Eating/Feeding: Set up, Sitting Eating/Feeding Details (indicate cue type and reason): drinking orange juice during session Grooming: Set up, Sitting, Min guard Grooming Details (indicate cue type and reason): supported sitting Upper Body Bathing: Moderate assistance, Sitting Lower Body Bathing: Maximal assistance, Sit to/from stand, Sitting/lateral leans Upper Body Dressing : Moderate assistance, Sitting Upper Body Dressing Details (indicate cue type and reason): requires increased assist to readjust gown start of session Lower Body Dressing: Maximal assistance, Sit to/from stand, Sitting/lateral leans Toilet Transfer: Maximal assistance, Squat-pivot Toilet Transfer Details (indicate cue type and reason): simulated via transfer to recliner Toileting- Clothing Manipulation and Hygiene: Maximal assistance, Sit to/from stand Functional mobility during ADLs: Maximal assistance( sit to stand only x3) General ADL Comments: pt with L side weakness, inattention, impaired cognition; grooming task with set-upA   Cognition: Cognition Overall Cognitive Status: No family/caregiver present to determine baseline cognitive functioning Arousal/Alertness: Awake/alert Orientation Level: Oriented X4 Attention: Focused Focused Attention: Impaired Focused Attention Impairment: Verbal basic, Functional basic Memory: Appears intact Awareness: Impaired  Awareness Impairment: Emergent impairment Problem Solving: Impaired Problem Solving Impairment: Functional basic, Verbal basic Executive Function: Self Monitoring Self Monitoring: Impaired Self Monitoring Impairment: Verbal basic, Functional basic Safety/Judgment: Impaired Cognition Arousal/Alertness: Awake/alert Behavior During Therapy: Flat affect Overall Cognitive Status: No  family/caregiver present to determine baseline cognitive functioning Area of Impairment: Attention, Memory, Safety/judgement, Awareness, Problem solving Current Attention Level: Sustained Memory: Decreased short-term memory Following Commands: Follows one step commands inconsistently, Follows one step commands with increased time Safety/Judgement: Decreased awareness of safety, Decreased awareness of deficits Awareness: Intellectual General Comments: required constant cueing througout session   Physical Exam: Blood pressure (!) 148/86, pulse 70, temperature 98.2 F (36.8 C), temperature source Oral, resp. rate 16, height  (1.575 m), weight 61.2 kg, SpO2 100 %. Physical Exam  Constitutional: No distress.  HENT:  Head: Normocephalic.  Eyes: Pupils are equal, round, and reactive to light. EOM are normal.  Neck: Normal range of motion.  Cardiovascular: Normal rate and regular rhythm.  No murmur heard. Respiratory: Effort normal and breath sounds normal. No respiratory distress. She has no wheezes.  GI: Soft. She exhibits no distension. There is no abdominal tenderness.  Musculoskeletal:        General: No edema.  Neurological:  Patient is alert. Fair to limited insight and awareness.  Normal language. Speech slurred. Left central 7 and tongue deviation. Left inattention.  LUE and LLE 0/5. Flexion contractures left wrist/fingers/elbow, left heel cord. DTR's 2++ on left. Underlying tone 2-3/4. RUE 4 to 5/5. Senses pain in all 4.       Skin: Skin is warm.  Psychiatric:  Flat but pleasant      Lab Results Last 48 Hours  No results found for this or any previous visit (from the past 48 hour(s)).   Imaging Results (Last 48 hours)  No results found.           Medical Problem List and Plan: 1.  Left side weakness secondary to multiple right brain infarcts adjacent to previous right MCA infarcts in the setting of right ICA acute on chronic occlusion/left ICA cavernous aneurysm  and current cocaine use. Follow-up outpatient interventional radiology for ICA aneurysm             -admit to inpatient rehab                      -WHO, PRAFO ordered for left side for spasticity/contracture mgt 2.  Antithrombotics: -DVT/anticoagulation:  Subcutaneous Lovenox             -antiplatelet therapy: Aspirin 325 mg daily 3. Pain Management: Lyrica 75 mg daily, Tylenol as needed 4. Mood/bipolar disorder             -antipsychotic agents: Risperdal 1 mg daily at bedtime 5. Neuropsych: This patient is capable of making decisions on her own behalf. 6. Skin/Wound Care:  Routine skin checks 7. Fluids/Electrolytes/Nutrition:  Routine in and out's with follow-up chemistries 8. Permissive hypertension. Monitor with increased mobility. Patient on Norvasc 5 mg daily, Coreg 12.5 mg twice a day prior to admission. Resume as needed 9. Diastolic congestive heart failure. Monitor for any signs of fluid overload             -daily weights 10. History of tobacco/cocaine use. Provide counseling 11. Hyperlipidemia. Lipitor   Post Admission Physician Evaluation: 1. Functional deficits secondary  to right brain infarcts. 2. Patient is admitted to receive collaborative, interdisciplinary care between the physiatrist, rehab nursing staff, and therapy team.  3. Patient's level of medical complexity and substantial therapy needs in context of that medical necessity cannot be provided at a lesser intensity of care such as a SNF. 4. Patient has experienced substantial functional loss from his/her baseline which was documented above under the "Functional History" and "Functional Status" headings.  Judging by the patient's diagnosis, physical exam, and functional history, the patient has potential for functional progress which will result in measurable gains while on inpatient rehab.  These gains will be of substantial and practical use upon discharge  in facilitating mobility and self-care at the household  level. 5. Physiatrist will provide 24 hour management of medical needs as well as oversight of the therapy plan/treatment and provide guidance as appropriate regarding the interaction of the two. 6. The Preadmission Screening has been reviewed and patient status is unchanged unless otherwise stated above. 7. 24 hour rehab nursing will assist with bladder management, bowel management, safety, skin/wound care, disease management, medication administration, pain management and patient education  and help integrate therapy concepts, techniques,education, etc. 8. PT will assess and treat for/with: Lower extremity strength, range of motion, stamina, balance, functional mobility, safety, adaptive techniques and equipment, NMR, family education community reentry.   Goals are: min assist. 9. OT will assess and treat for/with: ADL's, functional mobility, safety, upper extremity strength, adaptive techniques and equipment, NMR, family ed, community reentry.   Goals are: min assist. Therapy may proceed with showering this patient. 10. SLP will assess and treat for/with: cognition, communication, .  Goals are: min assist. 11. Case Management and Social Worker will assess and treat for psychological issues and discharge planning. 12. Team conference will be held weekly to assess progress toward goals and to determine barriers to discharge. 13. Patient will receive at least 3 hours of therapy per day at least 5 days per week. 14. ELOS: 18-21 days       15. Prognosis:  excellent   I have personally performed a face to face diagnostic evaluation of this patient and formulated the key components of the plan.  Additionally, I have personally reviewed laboratory data, imaging studies, as well as relevant notes and concur with the physician assistant's documentation above.  Ranelle Oyster, MD, FAAPMR     Mcarthur Rossetti Angiulli, PA-C 02/06/2019

## 2019-02-06 NOTE — Progress Notes (Signed)
Occupational Therapy Treatment Patient Details Name: [name] MRN: [id number] DOB: 1955/12/16 Today's Date: 02/06/2019    History of present illness  [name] is a 63 yo female with a HTN, hx of PE, schizophrenia, bipolar disorder prior stroke with residual left arm paralysis and left leg weakness, and cocaine use presenting with stroke symptoms, HA and falls.  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   OT comments  Pt presents sitting EOB working with PT, dovetail with PT to further address functional transfers and pt agreeable to continued OT session after transition to recliner. Pt requiring two person assist for safely completing stand pivot transfers as pt with LLE weakness/instability and difficulty advancing LLE. Completed LUE passive stretching while seated and able to achieve close to full elbow extension, L digit extension to approx 90*. Pt with painful L shoulder with attempts at PROM/stretching (suspect decreased scapular mobility). Pt completed seated grooming ADL with setup/supervision assist in supported sitting of recliner, utilizing L hand as gross grasp for items during task completion. Discharge recommendations have been updated, given pt progress and pt's PLOF feel she is an appropriate candidate for CIR level services at time of discharge to maximize her safety and independence with ADL and mobility.. Will continue to follow acutely.   Follow Up Recommendations  CIR;Supervision/Assistance - 24 hour    Equipment Recommendations  Other (comment)(defer to next venue)          Precautions / Restrictions Precautions Precautions: Fall Precaution Comments: LUE spasticity vs. tone able to achieve ROM Restrictions Weight Bearing Restrictions: No       Mobility Bed Mobility               General bed mobility comments: seated EOB with PT upon  arrival  Transfers Overall transfer level: Needs assistance Equipment used: 2 person hand held assist Transfers: Sit to/from [employer] Sit to Stand: Mod assist;+2 physical assistance;+2 safety/equipment Stand pivot transfers: Mod assist;Max assist;+2 physical assistance;+2 safety/equipment       General transfer comment: assist to stand from EOB, increased cues and assist to facilitate upright posture; with assist to offwt L side pt able to minimally scoot L foot along floor, requires L knee block, +2 assist to take pivotal steps towards recliner    Balance Overall balance assessment: Needs assistance   Sitting balance-Leahy Scale: Poor       Standing balance-Leahy Scale: Poor Standing balance comment: reliant on external assist for standing balance                           ADL either performed or assessed with clinical judgement   ADL Overall ADL's : Needs assistance/impaired     Grooming: Set up;Supervision/safety;Sitting;Wash/dry face;Oral care Grooming Details (indicate cue type and reason): seated in recliner; pt able to use L hand grossly to grip toothbrush while using R hand to rinse and spit into container               Lower Body Dressing Details (indicate cue type and reason): with external assist for sitting balance pt able to reach towards floor and pull sock back over L heel; increased effort and attempts to perform; encouragement to perform on her own             Functional mobility during ADLs: Maximal assistance;Moderate assistance;+2 for physical assistance;+2 for safety/equipment(stand pivot transfer)  Vision       Perception     Praxis      Cognition Arousal/Alertness: Awake/alert Behavior During Therapy: Flat affect Overall Cognitive Status: No family/caregiver present to determine baseline cognitive functioning Area of Impairment: Attention;Memory;Safety/judgement;Awareness;Problem solving                    Current Attention Level: Sustained Memory: Decreased short-term memory Following Commands: Follows one step commands inconsistently;Follows one step commands with increased time Safety/Judgement: Decreased awareness of safety;Decreased awareness of deficits Awareness: Intellectual Problem Solving: Slow processing;Difficulty sequencing;Requires verbal cues;Requires tactile cues General Comments: required constant cueing throughout session        Exercises Exercises: Other exercises Other Exercises Other Exercises: passive stretching to LUE within available limits   Shoulder Instructions       General Comments      Pertinent Vitals/ Pain       Pain Assessment: Faces Faces Pain Scale: Hurts little more Pain Location: headache and LUE with stretching and ROM.   Pain Descriptors / Indicators: Discomfort;Headache Pain Intervention(s): Monitored during session;Repositioned;Limited activity within patient's tolerance  Home Living                                          Prior Functioning/Environment              Frequency  Min 3X/week        Progress Toward Goals  OT Goals(current goals can now be found in the care plan section)  Progress towards OT goals: Progressing toward goals  Acute Rehab OT Goals Patient Stated Goal: get home. OT Goal Formulation: With patient Time For Goal Achievement: 02/18/19 Potential to Achieve Goals: Good ADL Goals Pt Will Perform Grooming: with supervision;sitting Pt Will Perform Upper Body Dressing: with min assist;sitting Pt Will Perform Lower Body Dressing: with min assist;sit to/from stand Pt Will Transfer to Toilet: with min assist;stand pivot transfer Pt Will Perform Toileting - Clothing Manipulation and hygiene: with min assist;sit to/from stand Pt/caregiver will Perform Home Exercise Program: Increased ROM;Left upper extremity;With minimal assist Additional ADL Goal #1: Pt will follow 2-3 step  commands 75% of the time during functional task. Additional ADL Goal #2: Pt will attend to L side of body/L environment during functional task with no more than min cues.  Plan Discharge plan needs to be updated    Co-evaluation    PT/OT/SLP Co-Evaluation/Treatment: Yes(overlap with PT for transfer) Reason for Co-Treatment: For patient/therapist safety;To address functional/ADL transfers   OT goals addressed during session: Strengthening/ROM(for functional transfers)      AM-PAC OT "6 Clicks" Daily Activity     Outcome Measure   Help from another person eating meals?: A Little Help from another person taking care of personal grooming?: A Little Help from another person toileting, which includes using toliet, bedpan, or urinal?: A Lot Help from another person bathing (including washing, rinsing, drying)?: A Lot Help from another person to put on and taking off regular upper body clothing?: A Lot Help from another person to put on and taking off regular lower body clothing?: A Lot 6 Click Score: 14    End of Session Equipment Utilized During Treatment: Gait belt  OT Visit Diagnosis: Other abnormalities of gait and mobility (R26.89);Muscle weakness (generalized) (M62.81);Hemiplegia and hemiparesis Hemiplegia - Right/Left: Left Hemiplegia - dominant/non-dominant: Non-Dominant Hemiplegia - caused by: Cerebral infarction   Activity Tolerance Patient  tolerated treatment well   Patient Left in chair;with call bell/phone within reach;with chair alarm set   Nurse Communication Mobility status        Time: 8280-0349 OT Time Calculation (min): 20 min  Charges: OT General Charges $OT Visit: 1 Visit OT Treatments $Self Care/Home Management : 8-22 mins  Marcy Siren, OT Supplemental Rehabilitation Services Pager [phone] Office [phone]     Orlando Penner 02/06/2019, 11:44 AM

## 2019-02-06 NOTE — Progress Notes (Signed)
   Subjective: No overnight events. Patient reported feeling okay this morning. She states her headache has improved some. No other complaints.  Objective:  Vital signs in last 24 hours: Vitals:   02/05/19 1944 02/05/19 2333 02/06/19 0351 02/06/19 0805  BP: (!) 148/72 (!) 152/84 (!) 148/86 (!) 178/101  Pulse: 62 65 70 67  Resp: 15 18 16 16   Temp: 98.6 F (37 C) 98.4 F (36.9 C) 98.2 F (36.8 C) 98 F (36.7 C)  TempSrc: Oral Oral Oral Oral  SpO2: 100% 98% 100% 98%  Weight:      Height:       Physical Exam Gen: seen comfortably resting in bed, no distress Ext: no edema, left UE and LE rigidity on passive motion, warm and well perfused   Assessment/Plan:  Active Problems:   CVA (cerebral vascular accident) [facility])   Carotid artery aneurysm (HCC)   Right carotid artery occlusion   Left hemiparesis (HCC)  [name] is a 63 yo female withHTN, hx of PE, schizophrenia, bipolar disorder,prior stroke with residual left arm paralysis and left leg weakness,andcocaine use presenting with a stroke.  CVA: Patient presented due to a headache and subsequent falls, found to have an acute on chronic cerebral infarction, nonhemorrhagic, right cerebral hemisphere. Neuro recommended to continue aspirin alone given enlarging left ICA aneurysm and CIR.  - Telemetry - PT/OT recommending SNF - Aspirin 325 mg  - Atorvastatin 40mg   - Will need outpatient referral to neuro IR for aneurysm   HTN: Per boyfriend, patient is on amlodipine 5 mg, will hold for now and monitor.  148/86  Dispo: Anticipated discharge pending SNF placement.    Jaci Standard, DO 02/06/2019, 10:03 AM Pager: [phone]

## 2019-02-06 NOTE — Care Management Important Message (Signed)
Important Message  Patient Details  Name: [name] MRN: [id number] Date of Birth: [birth year]   Medicare Important Message Given:  Yes    Dorena Bodo 02/06/2019, 4:47 PM

## 2019-02-07 ENCOUNTER — Inpatient Hospital Stay (HOSPITAL_COMMUNITY): Payer: Medicare Other | Admitting: Occupational Therapy

## 2019-02-07 ENCOUNTER — Inpatient Hospital Stay (HOSPITAL_COMMUNITY): Payer: Medicare Other | Admitting: Speech Pathology

## 2019-02-07 ENCOUNTER — Inpatient Hospital Stay (HOSPITAL_COMMUNITY): Payer: Medicare Other | Admitting: Physical Therapy

## 2019-02-07 DIAGNOSIS — I69322 Dysarthria following cerebral infarction: Secondary | ICD-10-CM

## 2019-02-07 DIAGNOSIS — I639 Cerebral infarction, unspecified: Secondary | ICD-10-CM

## 2019-02-07 NOTE — Progress Notes (Signed)
[place] PHYSICAL MEDICINE & REHABILITATION PROGRESS NOTE   Subjective/Complaints: Pt oriented to [facility], follows simple commands, poor attn to left side Speech severely dysarthric with limited output  ROS- could not obtain- pt pulling covers over her head   Objective:   No results found. No results for input(s): WBC, HGB, HCT, PLT in the last 72 hours. No results for input(s): NA, K, CL, CO2, GLUCOSE, BUN, CREATININE, CALCIUM in the last 72 hours. No intake or output data in the 24 hours ending 02/07/19 0957   Physical Exam: Vital Signs Blood pressure (!) 157/102, pulse 71, temperature 99.5 F (37.5 C), temperature source Oral, resp. rate 18, weight 60.5 kg, SpO2 98 %.   General: No acute distress Mood and affect FLat , poor eye contact Heart: Regular rate and rhythm no rubs murmurs or extra sounds Lungs: Clear to auscultation, breathing unlabored, no rales or wheezes Abdomen: Positive bowel sounds, soft nontender to palpation, nondistended Extremities: No clubbing, cyanosis, or edema Skin: No evidence of breakdown, no evidence of rash Neurologic: Cranial nerves II through XII intact, motor strength is 5/5 in RIght  deltoid, bicep, tricep, grip, hip flexor, knee extensors, ankle dorsiflexor and plantar flexor 0/5 LUE 3-/5 LLE HF, KE, trace ankle DF/PF Tone increase Left biceps, finger flexores, wrist flexors, ankle PF Sensory exam normal sensation to light touch and proprioception in bilateral upper and lower extremities Cerebellar exam normal finger to nose to finger as well as heel to shin in bilateral upper and lower extremities Musculoskeletal: reduced range of motion LUE dn LLE. No joint swelling   Assessment/Plan: 1. Functional deficits secondary to RIght Basal ganglia infarct which require 3+ hours per day of interdisciplinary therapy in a comprehensive inpatient rehab setting.  Physiatrist is providing close team supervision and 24 hour management of active medical  problems listed below.  Physiatrist and rehab team continue to assess barriers to discharge/monitor patient progress toward functional and medical goals  Care Tool:  Bathing              Bathing assist       Upper Body Dressing/Undressing Upper body dressing        Upper body assist      Lower Body Dressing/Undressing Lower body dressing            Lower body assist       Toileting Toileting    Toileting assist       Transfers Chair/bed transfer  Transfers assist           Locomotion Ambulation   Ambulation assist              Walk 10 feet activity   Assist           Walk 50 feet activity   Assist           Walk 150 feet activity   Assist           Walk 10 feet on uneven surface  activity   Assist           Wheelchair     Assist               Wheelchair 50 feet with 2 turns activity    Assist            Wheelchair 150 feet activity     Assist          Medical Problem List and Plan: 1.Left side weaknesssecondary to multiple right brain infarcts adjacent  to previous right MCA infarcts in the setting of 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 Follow-up outpatient interventional radiology for ICA aneurysm -CIR PT, OT, SLP -WHO, PRAFO ordered for left side for spasticity/contracture mgt 2. Antithrombotics: -DVT/anticoagulation:Subcutaneous Lovenox -antiplatelet therapy: Aspirin 325 mg daily 3. Pain Management:Lyrica 75 mg daily, Tylenol as needed 4. Mood/bipolar disorder -antipsychotic agents: Risperdal 1 mg daily at bedtime 5. Neuropsych: This patientiscapable of making decisions on herown behalf. 6. Skin/Wound Care:Routine skin checks 7. Fluids/Electrolytes/Nutrition:Routine in and out's with follow-up chemistries 8. Permissive hypertension. Monitor with  increased mobility. Patient on Norvasc 5 mg daily, Coreg 12.5 mg twice a day prior to admission. Resume as needed Vitals:   02/06/19 2220 02/07/19 0429  BP: 123/71 (!) 157/102  Pulse: 87 71  Resp: 20 18  Temp: 100.1 F (37.8 C) 99.5 F (37.5 C)  SpO2: 97% 98%  labile but within acceptable range 9. Diastolic congestive heart failure. Monitor for any signs of fluid overload -daily weights 10. History oftobacco/cocaine use. Provide counseling 11. Hyperlipidemia. Lipitor    LOS: 1 days A FACE TO FACE EVALUATION WAS PERFORMED  Erick Colace 02/07/2019, 9:57 AM

## 2019-02-07 NOTE — Evaluation (Signed)
Physical Therapy Assessment and Plan  Patient Details  Name: [name] MRN: [id number] Date of Birth: 1956/07/25  PT Diagnosis: Abnormality of gait, Hemiplegia non-dominant, Impaired cognition, Impaired sensation and Muscle weakness Rehab Potential: Fair ELOS: 18-21 days    Today's Date: 02/07/2019 PT Individual Time: 1300-1415 PT Individual Time Calculation (min): 75 min    Problem List:  Patient Active Problem List   Diagnosis Date Noted  . Aneurysm, cerebral, nonruptured 02/06/2019  . Acute right MCA stroke ([place]) 02/06/2019  . 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 Right carotid artery occlusion 02/05/2019  . Left hemiparesis ([place]) 02/05/2019  . CVA (cerebral vascular accident) ([place]) 02/03/2019    Past Medical History:  Past Medical History:  Diagnosis Date  . Blood transfusion without reported diagnosis   . CHF (congestive heart failure) ([place])   . Hypertension   . Stroke [facility])    Past Surgical History:  Past Surgical History:  Procedure Laterality Date  . 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 Per chart review patient lives with significant other/[name] who provides 24/7 assist she also has a home health aid 8 hours a day 5 days a week.one level home with multiple steps to entry. Patient used a straight point cane prior to admission. Presented 02/04/2019 with headache,increased left side weakness and frequent falls.CT/MRI showed acute on chronic cerebral infarction, nonhemorrhagic, right cerebral hemisphere. Acute nonhemorrhagic infarction  involves the basal ganglia, residual posterior temporal occipital lobe as well as regional white matter. 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 Echocardiogram with ejection fraction of 60% without embolus. Urine drug screen was positive for cocaine. Neurology follow-up currently maintained on aspirin 325 mg daily for CVA prophylaxis.  Patient transferred to CIR on 02/06/2019 .   Patient currently requires max with mobility secondary to muscle weakness, muscle joint tightness and muscle paralysis, decreased cardiorespiratoy endurance, impaired timing and sequencing, abnormal tone, unbalanced muscle activation, decreased coordination and decreased motor planning, decreased visual perceptual skills and field cut, decreased attention to left, decreased attention, decreased awareness, decreased problem solving, decreased safety awareness, decreased memory and delayed processing and decreased sitting balance, decreased standing balance, decreased postural control, hemiplegia and decreased balance strategies.  Prior to hospitalization, patient was min with mobility and lived with Significant other in a House home.  Home access is 6Stairs to enter.  Patient will benefit from skilled PT intervention to maximize safe functional mobility, minimize fall risk and decrease caregiver burden for planned discharge home with 24 hour assist.  Anticipate patient will benefit from follow up [facility] at discharge.  PT Assessment Rehab Potential (ACUTE/IP ONLY): Fair PT Barriers to Discharge: Inaccessible home environment;Decreased caregiver support;Medical stability;Insurance for SNF coverage PT Patient demonstrates impairments in the following area(s): Balance;Behavior;Endurance;Motor;Nutrition;Safety;Sensory;Perception;Skin Integrity PT Transfers Functional Problem(s): Bed Mobility;Bed  to Chair;Car;Furniture;Floor PT Locomotion Functional Problem(s): Ambulation;Wheelchair Mobility;Stairs PT Plan PT Intensity: Minimum of 1-2 x/day ,45 to 90 minutes PT Frequency: 5 out of 7 days PT  Duration Estimated Length of Stay: 18-21 days  PT Treatment/Interventions: Ambulation/gait training;Balance/vestibular training;Cognitive remediation/compensation;Community reintegration;Functional electrical stimulation;Disease management/prevention;Discharge planning;DME/adaptive equipment instruction;Pain management;Functional mobility training;Psychosocial support;Therapeutic Activities;Splinting/orthotics;UE/LE Strength taining/ROM;Visual/perceptual remediation/compensation;Wheelchair propulsion/positioning;Therapeutic Exercise;Stair training;Neuromuscular re-education;Patient/family education;Skin care/wound management;UE/LE Coordination activities PT Transfers Anticipated Outcome(s): Min assist with LRAD  PT Locomotion Anticipated Outcome(s): Supervision assist WC mobility. mod assist ambulation for short distances PT Recommendation Follow Up Recommendations: Home health PT Patient destination: Home Equipment Recommended: Wheelchair cushion (measurements);Other (comment)(HW)  Skilled Therapeutic Intervention Pt received supine in bed and agreeable to PT. Supine>sit transfer with max assist and cues for safety. PT instructed patient in PT Evaluation and initiated treatment intervention; see below for results. PT educated patient in [place], rehab potential, rehab goals, and discharge recommendations. PT assessed orthostatic vitals 182/93 siting. 149/112, HR 61-66bpm. Re-assessed in sitting 173/95. RN made aware. Gait not attempted due to BP ,and Pt refused to attempt WC mobility. Patient returned to room and left sitting in [facility] with call bell in reach and all needs met.       PT Evaluation Precautions/Restrictions   fall General   Vital SignsTherapy Vitals Pulse Rate: 66 BP: (!) 172/95 Patient  Position (if appropriate): Orthostatic Vitals see above Pain   denies Home Living/Prior Functioning Home Living Available Help at Discharge: Family;Friend(s);Personal care attendant;Available 24 hours/day Type of Home: House Home Access: Stairs to enter [employer] of Steps: 6 Entrance Stairs-Rails: Right;Left;Can reach both Home Layout: Two level;Able to live on main level with bedroom/bathroom Bathroom Shower/Tub: Multimedia programmer: Handicapped height Bathroom Accessibility: Yes Additional Comments: may need to clarify additional DME and home setup - pt somewhat agitated with questioning this session  Lives With: Significant other Prior Function Level of Independence: Requires assistive device for independence;Needs assistance with ADLs Vocation: On disability Comments: per Suezanne Jacquet, pt needs help with ADLs at baseline, occasionally needed min assist with transfers/gait but not consistently, has an aid M-F from 8-3 and Sa/Su for 4 hrs each day (information gathered by [facility]) Vision/Perception  Vision - Assessment Additional Comments: R gaze preference. mild L side field cut and inattention to the L Perception Perception: Impaired Praxis Praxis: Impaired Praxis Impairment Details: Ideomotor  Cognition Overall Cognitive Status: History of cognitive impairments - at baseline Arousal/Alertness: Awake/alert Orientation Level: Oriented X4 Attention: Focused;Sustained Focused Attention: Appears intact Sustained Attention: Impaired Sustained Attention Impairment: Functional basic Memory: Impaired Memory Impairment: Retrieval deficit;Decreased recall of new information Awareness Impairment: Emergent impairment Problem Solving: Impaired Problem Solving Impairment: Functional basic Safety/Judgment: Impaired Sensation Sensation Light Touch: Impaired Detail Central sensation comments: decreased appreciation to light touch on the L, absent light outch in the L foot   Light Touch Impaired Details: Impaired LLE;Impaired LUE Proprioception: Impaired by gross assessment Coordination Gross Motor Movements are Fluid and Coordinated: No Fine Motor Movements are Fluid and Coordinated: No Coordination and Movement Description: Dense L hemiplegia UE and LE  Motor  Motor Motor: Hemiplegia;Abnormal tone Motor - Skilled Clinical Observations: Dense L hemiplegia reports premorbid tone in the LUE following old CVA   Mobility Bed Mobility Bed Mobility: Supine to Sit;Sit to Supine Supine to Sit: Maximal Assistance - Patient - Patient 25-49% Sit to Supine: Maximal Assistance - Patient 25-49% Transfers Transfers: Sit to Stand;Stand Pivot Transfers Sit to Stand: Moderate Assistance - Patient 50-74% Stand Pivot Transfers: Maximal Assistance - Patient 25 - 49% Locomotion     Trunk/Postural Assessment  Cervical Assessment Cervical Assessment: Exceptions to [facility] Thoracic Assessment Thoracic Assessment: Exceptions to [facility] Lumbar Assessment Lumbar Assessment: Exceptions to [facility]  Postural Control Postural Control: Deficits on evaluation  Balance Balance Balance Assessed: Yes Static Sitting Balance Static Sitting - Level of Assistance: 4: Min assist Dynamic Sitting Balance Dynamic Sitting - Level of Assistance: 3: Mod assist Static Standing Balance Static Standing - Level of Assistance: 3: Mod assist;2: Max assist Extremity Assessment      RLE Assessment RLE Assessment: Within Functional Limits LLE Assessment LLE Assessment: Exceptions to [facility] General Strength Comments: 2/5 in syngery pattern in supine. no isolated movement noted     Refer to Care Plan for Long Term Goals  Recommendations for other services: None   Discharge Criteria: Patient will be discharged from PT if patient refuses treatment 3 consecutive times without medical reason, if treatment goals not met, if there is a change in medical status, if patient makes no progress towards goals or if  patient is discharged from hospital.  The above assessment, treatment plan, treatment alternatives and goals were discussed and mutually agreed upon: by patient  Lorie Phenix 02/07/2019, 2:21 PM

## 2019-02-07 NOTE — Evaluation (Signed)
Occupational Therapy Assessment and Plan  Patient Details  Name: [name] MRN: [id number] Date of Birth: 01/13/1956  OT Diagnosis: abnormal posture, cognitive deficits, hemiplegia affecting non-dominant side and decreased coordination, decreased sensation Rehab Potential: Rehab Potential (ACUTE ONLY): Fair ELOS: 18-21 days   Today's Date: 02/07/2019 OT Individual Time: 3704-8889 OT Individual Time Calculation (min): 55 min     Problem List:  Patient Active Problem List   Diagnosis Date Noted  . Aneurysm, cerebral, nonruptured 02/06/2019  . Acute right MCA stroke ([place]) 02/06/2019  . Carotid artery aneurysm ([place]) 02/05/2019  . Right carotid artery occlusion 02/05/2019  . Left hemiparesis ([place]) 02/05/2019  . CVA (cerebral vascular accident) ([place]) 02/03/2019    Past Medical History:  Past Medical History:  Diagnosis Date  . Blood transfusion without reported diagnosis   . CHF (congestive heart failure) ([place])   . Hypertension   . Stroke [facility])    Past Surgical History:  Past Surgical History:  Procedure Laterality Date  . ABDOMINAL SURGERY      Assessment & Plan Clinical Impression: Patient is a 63 y.o. 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 Per chart review patient lives with significant other/[name] who provides 24/7 assist she also has a home health aid 8 hours a day 5 days a week.one level home with multiple steps to entry. Patient used a straight point cane prior to admission. Presented 02/04/2019 with headache,increased left side weakness and frequent falls.CT/MRI showed acute on chronic cerebral infarction, nonhemorrhagic, right  cerebral hemisphere. Acute nonhemorrhagic infarction involves the basal ganglia, residual posterior temporal occipital lobe as well as regional white matter. 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 Echocardiogram with ejection fraction of 60% without embolus. Urine drug screen was positive for cocaine. Neurology follow-up currently maintained on aspirin 325 mg daily for CVA prophylaxis. Subcutaneous Lovenox for DVT prophylaxis. Tolerating a regular diet. Therapy evaluations completed with recommendations of physical medicine rehabilitation consult .  Patient transferred to CIR on 02/06/2019 .    Patient currently requires max with basic self-care skills secondary to muscle weakness, decreased cardiorespiratoy endurance, abnormal tone, decreased coordination and decreased motor planning, decreased visual acuity and R gaze preference, decreased midline orientation and decreased attention to left, decreased initiation, decreased attention, decreased awareness, decreased problem solving, decreased safety awareness and decreased memory, safety awareness and decreased sitting balance, decreased standing balance, decreased postural control, hemiplegia and decreased balance strategies.  Prior to hospitalization, patient could complete ADLs with min A.  Patient will benefit from skilled intervention to decrease level of assist with basic self-care skills prior to discharge home with care partner.  Anticipate patient will require 24 hour supervision and minimal physical assistance and follow up home health.  OT - End of Session Activity Tolerance: Decreased this session Endurance Deficit: Yes Endurance Deficit Description: multiple rest breaks secondary to fatigue OT Assessment Rehab Potential (ACUTE ONLY): Fair OT Barriers to Discharge: Other (comments) OT Barriers to  Discharge Comments: drug use OT Patient demonstrates impairments in the following area(s): Balance;Behavior;Cognition;Endurance;Motor;Pain;Perception;Safety;Sensory;Vision OT Basic ADL's  Functional Problem(s): Grooming;Bathing;Dressing;Toileting OT Transfers Functional Problem(s): Tub/Shower;Toilet OT Additional Impairment(s): None OT Plan OT Intensity: Minimum of 1-2 x/day, 45 to 90 minutes OT Frequency: 5 out of 7 days OT Duration/Estimated Length of Stay: 18-21 days OT Treatment/Interventions: Balance/vestibular training;Disease mangement/prevention;Neuromuscular re-education;Self Care/advanced ADL retraining;Therapeutic Exercise;Wheelchair propulsion/positioning;Cognitive remediation/compensation;DME/adaptive equipment instruction;Pain management;UE/LE Strength taining/ROM;Community reintegration;Functional electrical stimulation;Patient/family education;UE/LE Coordination activities;Discharge planning;Psychosocial support;Therapeutic Activities;Visual/perceptual remediation/compensation OT Self Feeding Anticipated Outcome(s): n/a OT Basic Self-Care Anticipated Outcome(s): min A OT Toileting Anticipated Outcome(s): min A OT Bathroom Transfers Anticipated Outcome(s): min A OT Recommendation Recommendations for Other Services: Neuropsych consult Patient destination: Home Follow Up Recommendations: Home health OT Equipment Recommended: To be determined   Skilled Therapeutic Intervention Upon entering the room, pt supine in bed with no c/o, symptoms, or signs of pain. Pt. Initially very lethargic and needing increased time to arouse for participation. OT educated pt on OT purpose, POC, and goals with pt verbalizing understanding. Pt refusing dressing this session. Pt verbalized, " I am wet." OT provided set up and pt able to perform her own hygiene with mod A for bed mobility. Supine >sit with mod A and pt standing x 3 with max A. While in standing, OT changing linens of bed for hygiene. Pt  returning back to bed in same manner and falling immediately back asleep. Bed alarm activated and call bell within reach upon exiting the room.   OT Evaluation Precautions/Restrictions  Precautions Precautions: Fall Restrictions Weight Bearing Restrictions: No  Pain Pain Assessment Pain Scale: Faces Pain Score: 7  Faces Pain Scale: Hurts even more Pain Type: Other (Comment) Pain Location: Hand Pain Orientation: Left Pain Descriptors / Indicators: Discomfort;Grimacing;Guarding Pain Frequency: Intermittent Pain Onset: With Activity Patients Stated Pain Goal: 2 Pain Intervention(s): Repositioned Home Living/Prior Functioning Home Living Available Help at Discharge: Family, Friend(s), Personal care attendant, Available 24 hours/day Type of Home: House Home Access: Stairs to enter Technical brewer of Steps: 15 Home Layout: Two level, Able to live on main level with bedroom/bathroom Bathroom Shower/Tub: Multimedia programmer: Handicapped height Bathroom Accessibility: Yes  Lives With: Significant other Prior Function Comments: per [place], pt needs help with ADLs at baseline, occasionally needed min assist with transfers/gait but not consistently, has an aid M-F from 8-3 and Sa/Su for 4 hrs each day (information gathered by [facility]) Vision Patient Visual Report: Blurring of vision Vision Assessment?: Vision impaired- to be further tested in functional context Additional Comments: R gaze preference and reports "hard to see out of left eye sometimes" but also states this is not new Perception  Perception: Impaired Praxis Praxis: Impaired Praxis Impairment Details: Ideomotor Cognition Overall Cognitive Status: History of cognitive impairments - at baseline Arousal/Alertness: Awake/alert Orientation Level: Person Year: (did not answer) Month: (did not answer) Day of Week: Incorrect Memory: Impaired Memory Impairment: Retrieval deficit;Decreased recall of new  information Immediate Memory Recall: Sock Memory Recall: (0/3) Attention: Focused;Sustained Focused Attention: Appears intact Sustained Attention: Impaired Sustained Attention Impairment: Functional basic Awareness Impairment: Emergent impairment Problem Solving: Impaired Problem Solving Impairment: Functional basic Safety/Judgment: Impaired Sensation Sensation Light Touch: Impaired Detail Central sensation comments: decreased appreciation to light touch on the L, absent light outch in the L foot  Light Touch Impaired Details: Impaired LLE;Impaired LUE Proprioception: Impaired by gross assessment Coordination Gross Motor Movements are Fluid and Coordinated: No Fine Motor Movements are Fluid and Coordinated: No Coordination and Movement Description: Dense L hemiplegia UE and LE  Motor  Motor Motor: Hemiplegia;Abnormal tone Motor - Skilled Clinical Observations: Dense L hemiplegia reports  premorbid tone in the LUE following old CVA  Mobility  Bed Mobility Bed Mobility: Supine to Sit;Sit to Supine Supine to Sit: Maximal Assistance - Patient - Patient 25-49% Sit to Supine: Maximal Assistance - Patient 25-49% Transfers Sit to Stand: Moderate Assistance - Patient 50-74%;Maximal Assistance - Patient 25-49%  Trunk/Postural Assessment  Cervical Assessment Cervical Assessment: Exceptions to [facility] Thoracic Assessment Thoracic Assessment: Exceptions to [facility] Lumbar Assessment Lumbar Assessment: Exceptions to [facility] Postural Control Postural Control: Deficits on evaluation  Balance Balance Balance Assessed: Yes Static Sitting Balance Static Sitting - Level of Assistance: 4: Min assist Dynamic Sitting Balance Dynamic Sitting - Level of Assistance: 3: Mod assist Static Standing Balance Static Standing - Level of Assistance: 3: Mod assist;2: Max assist Extremity/Trunk Assessment RUE Assessment RUE Assessment: Within Functional Limits LUE Assessment LUE Assessment: Exceptions to  [facility] Passive Range of Motion (PROM) Comments: decreased secondary to tone and pain with stretching General Strength Comments: premorbid increased tone.      Refer to Care Plan for Long Term Goals  Recommendations for other services: Neuropsych   Discharge Criteria: Patient will be discharged from OT if patient refuses treatment 3 consecutive times without medical reason, if treatment goals not met, if there is a change in medical status, if patient makes no progress towards goals or if patient is discharged from hospital.  The above assessment, treatment plan, treatment alternatives and goals were discussed and mutually agreed upon: by patient  Gypsy Decant 02/07/2019, 10:42 AM

## 2019-02-07 NOTE — Evaluation (Signed)
Speech Language Pathology Assessment and Plan  Patient Details  Name: [name] MRN: [id number] Date of Birth: May 20, 1956  SLP Diagnosis: Cognitive Impairments;Dysarthria  Rehab Potential: Good ELOS: 2.5-3 weeks     Today's Date: 02/07/2019 SLP Individual Time: 0725-0820 SLP Individual Time Calculation (min): 55 min   Problem List:  Patient Active Problem List   Diagnosis Date Noted  . Aneurysm, cerebral, nonruptured 02/06/2019  . Acute right MCA stroke ([place]) 02/06/2019  . Carotid artery aneurysm ([place]) 02/05/2019  . Right carotid artery occlusion 02/05/2019  . Left hemiparesis ([place]) 02/05/2019  . CVA (cerebral vascular accident) ([place]) 02/03/2019   Past Medical History:  Past Medical History:  Diagnosis Date  . Blood transfusion without reported diagnosis   . CHF (congestive heart failure) ([place])   . Hypertension   . Stroke [facility])    Past Surgical History:  Past Surgical History:  Procedure Laterality Date  . 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 Per chart review patient lives with significant other/[name] who provides 24/7 assist she also has a home health aid 8 hours a day 5 days a week.one level home with multiple steps to entry. Patient used a straight point cane prior to admission. Presented 02/04/2019 with headache,increased left side weakness and frequent falls.CT/MRI showed acute on chronic cerebral infarction, nonhemorrhagic, right cerebral hemisphere. Acute nonhemorrhagic infarction involves the basal ganglia, residual  posterior temporal occipital lobe as well as regional white matter. 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 Echocardiogram with ejection fraction of 60% without embolus. Urine drug screen was positive for cocaine. Neurology follow-up currently maintained on aspirin 325 mg daily for CVA prophylaxis. Subcutaneous Lovenox for DVT prophylaxis. Tolerating a regular diet. Therapy evaluations completed with recommendations of physical medicine rehabilitation consult. Patient was admitted for a comprehensive rehabilitation program 02/06/19.  Patient demonstrates a moderate dysarthria characterized by imprecise consonants due to oral-motor weakness and an increased rate of speech which reduces her intelligibility at the phrase level. Patient also has a history of cognitive impairments at baseline but demonstrates new mild impairments in attention to left field of environment, functional problem solving, sustained attention and initiation. Patient would benefit from skilled SLP intervention to maximize her cognitive functioning and speech intelligibility prior to discharge.    Skilled Therapeutic Interventions          Administered a cognitive-linguistic evaluation, please see above for details. Upon entering room, patient required Min-Mod verbal cues to locate items on tray in left field of environment. Patient also demonstrated decreased initiation with both verbal and functional tasks and required extra time and Min A verbal cues for initiation. Patient educated in regards to patient's current cognitive deficits and decreased speech intelligibility and goals of skilled SLP intervention, she verbalized understanding.    SLP Assessment  Patient will need skilled [facility] during CIR admission    Recommendations  Oral Care Recommendations: Oral  care BID Recommendations for Other Services:  Neuropsych consult Patient destination: Home Follow up Recommendations: 24 hour supervision/assistance;Home Health SLP Equipment Recommended: None recommended by SLP    SLP Frequency 3 to 5 out of 7 days   SLP Duration  SLP Intensity  SLP Treatment/Interventions 2.5-3 weeks   Minumum of 1-2 x/day, 30 to 90 minutes  Cueing hierarchy;Functional tasks;Patient/family education;Therapeutic Activities;Cognitive remediation/compensation;Environmental controls;Internal/external aids;Speech/Language facilitation    Pain Pain Assessment Pain Scale: Faces Pain Score: 7  Faces Pain Scale: Hurts even more Pain Type: Other (Comment) Pain Location: Hand Pain Orientation: Left Pain Descriptors / Indicators: Discomfort;Grimacing;Guarding Pain Frequency: Intermittent Pain Onset: With Activity Patients Stated Pain Goal: 2 Pain Intervention(s): Repositioned  Prior Functioning Type of Home: House  Lives With: Significant other Available Help at Discharge: Family;Friend(s);Personal care attendant;Available 24 hours/day  Short Term Goals: Week 1: SLP Short Term Goal 1 (Week 1): Patient will utilize speech intelligibility strategies at the phrase level with achieve ~90% intelligibility with Min A verbal cues.  SLP Short Term Goal 2 (Week 1): Patient will demonstrate sustained attention to tasks for ~30 minutes with Mod A verbal cues.  SLP Short Term Goal 3 (Week 1): Patient will initiate functional tasks with supervision verbal cues.  SLP Short Term Goal 4 (Week 1): Patient will demonstrate functional problem solving for basic and familiar tasks with Min A verbal cues.  SLP Short Term Goal 5 (Week 1): Patient will attend to left field of enviornment during functional tasks with Min A verbal cues.   Refer to Care Plan for Long Term Goals  Recommendations for other services: Neuropsych  Discharge Criteria: Patient will be discharged from SLP if  patient refuses treatment 3 consecutive times without medical reason, if treatment goals not met, if there is a change in medical status, if patient makes no progress towards goals or if patient is discharged from hospital.  The above assessment, treatment plan, treatment alternatives and goals were discussed and mutually agreed upon: by patient  [name] 02/07/2019, 12:40 PM

## 2019-02-07 NOTE — Progress Notes (Signed)
Orthopedic Tech Progress Note Patient Details:  [name] 02/23/1956 [id number] Called in order to [facility] Patient ID: [name], female   DOB: 05/07/1956, 63 y.o.   MRN: [id number]   Donald Pore 02/07/2019, 8:34 AM

## 2019-02-08 ENCOUNTER — Inpatient Hospital Stay (HOSPITAL_COMMUNITY): Payer: Medicare Other | Admitting: Speech Pathology

## 2019-02-08 ENCOUNTER — Inpatient Hospital Stay (HOSPITAL_COMMUNITY): Payer: Medicare Other | Admitting: Occupational Therapy

## 2019-02-08 ENCOUNTER — Inpatient Hospital Stay (HOSPITAL_COMMUNITY): Payer: Medicare Other | Admitting: Physical Therapy

## 2019-02-08 MED ORDER — SENNOSIDES-DOCUSATE SODIUM 8.6-50 MG PO TABS
1.0000 | ORAL_TABLET | Freq: Two times a day (BID) | ORAL | Status: DC
  Administered 2019-02-08 – 2019-03-02 (×41): 1 via ORAL
  Filled 2019-02-08 (×42): qty 1

## 2019-02-08 MED ORDER — TIZANIDINE HCL 2 MG PO TABS
2.0000 mg | ORAL_TABLET | Freq: Three times a day (TID) | ORAL | Status: DC
  Administered 2019-02-08 – 2019-02-10 (×7): 2 mg via ORAL
  Filled 2019-02-08 (×7): qty 1

## 2019-02-08 NOTE — Progress Notes (Signed)
[place] PHYSICAL MEDICINE & REHABILITATION PROGRESS NOTE   Subjective/Complaints:  Pt slept betterlast noc, more alert , remains dysarthric  ROS- no breathing issues, + constipation , neg for pains   Objective:   No results found. No results for input(s): WBC, HGB, HCT, PLT in the last 72 hours. No results for input(s): NA, K, CL, CO2, GLUCOSE, BUN, CREATININE, CALCIUM in the last 72 hours. No intake or output data in the 24 hours ending 02/08/19 0849   Physical Exam: Vital Signs Blood pressure (!) 152/92, pulse 69, temperature 98.3 F (36.8 C), temperature source Oral, resp. rate 18, weight 60.5 kg, SpO2 95 %.   General: No acute distress Mood and affect FLat , poor eye contact Heart: Regular rate and rhythm no rubs murmurs or extra sounds Lungs: Clear to auscultation, breathing unlabored, no rales or wheezes Abdomen: Positive bowel sounds, soft nontender to palpation, nondistended Extremities: No clubbing, cyanosis, or edema Skin: No evidence of breakdown, no evidence of rash Neurologic: Cranial nerves II through XII intact, motor strength is 5/5 in RIght  deltoid, bicep, tricep, grip, hip flexor, knee extensors, ankle dorsiflexor and plantar flexor 0/5 LUE 3-/5 LLE HF, KE, trace ankle DF/PF Tone increase Left biceps, finger flexores, wrist flexors, ankle PF Sensory exam normal sensation to light touch and proprioception in bilateral upper and lower extremities Cerebellar exam normal finger to nose to finger as well as heel to shin in bilateral upper and lower extremities Musculoskeletal: reduced range of motion LUE dn LLE. No joint swelling   Assessment/Plan: 1. Functional deficits secondary to RIght Basal ganglia infarct which require 3+ hours per day of interdisciplinary therapy in a comprehensive inpatient rehab setting.  Physiatrist is providing close team supervision and 24 hour management of active medical problems listed below.  Physiatrist and rehab team  continue to assess barriers to discharge/monitor patient progress toward functional and medical goals  Care Tool:  Bathing    Body parts bathed by patient: Chest, Front perineal area, Abdomen, Buttocks, Right upper leg, Left upper leg   Body parts bathed by helper: Right arm, Left arm, Right lower leg, Left lower leg, Face     Bathing assist Assist Level: Maximal Assistance - Patient 24 - 49%     Upper Body Dressing/Undressing Upper body dressing Upper body dressing/undressing activity did not occur (including orthotics): Refused What is the patient wearing?: Hospital gown only    Upper body assist Assist Level: Maximal Assistance - Patient 25 - 49%    Lower Body Dressing/Undressing Lower body dressing      What is the patient wearing?: Incontinence brief     Lower body assist Assist for lower body dressing: Total Assistance - Patient < 25%     Toileting Toileting    Toileting assist Assist for toileting: Dependent - Patient 0%     Transfers Chair/bed transfer  Transfers assist     Chair/bed transfer assist level: Maximal Assistance - Patient 25 - 49%     Locomotion Ambulation   Ambulation assist   Ambulation activity did not occur: Safety/medical concerns          Walk 10 feet activity   Assist  Walk 10 feet activity did not occur: Safety/medical concerns        Walk 50 feet activity   Assist Walk 50 feet with 2 turns activity did not occur: Safety/medical concerns         Walk 150 feet activity   Assist Walk 150 feet activity did not  occur: Safety/medical concerns         Walk 10 feet on uneven surface  activity   Assist Walk 10 feet on uneven surfaces activity did not occur: Safety/medical concerns         Wheelchair     Assist Will patient use wheelchair at discharge?: Yes Type of Wheelchair: Manual Wheelchair activity did not occur: Refused         Wheelchair 50 feet with 2 turns activity    Assist     Wheelchair 50 feet with 2 turns activity did not occur: Refused       Wheelchair 150 feet activity     Assist Wheelchair 150 feet activity did not occur: Safety/medical concerns        Medical Problem List and Plan: 1.Left side weaknesssecondary to multiple right brain infarcts adjacent to previous right MCA infarcts in the setting of 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 Follow-up outpatient interventional radiology for ICA aneurysm -CIR PT, OT, SLP -WHO, PRAFO ordered for left side for spasticity/contracture mgt 2. Antithrombotics: -DVT/anticoagulation:Subcutaneous Lovenox -antiplatelet therapy: Aspirin 325 mg daily 3. Pain Management:Lyrica 75 mg daily, Tylenol as needed 4. Mood/bipolar disorder -antipsychotic agents: Risperdal 1 mg daily at bedtime 5. Neuropsych: This patientiscapable of making decisions on herown behalf. 6. Skin/Wound Care:Routine skin checks 7. Fluids/Electrolytes/Nutrition:Routine in and out's with follow-up chemistries 8. Permissive hypertension. Monitor with increased mobility. Patient on Norvasc 5 mg daily, Coreg 12.5 mg twice a day prior to admission. Resume as needed Vitals:   02/07/19 1931 02/08/19 0541  BP: (!) 146/93 (!) 152/92  Pulse: 76 69  Resp: 18 18  Temp: 98.3 F (36.8 C) 98.3 F (36.8 C)  SpO2: 99% 95%  labile but within acceptable range 9. Diastolic congestive heart failure. Monitor for any signs of fluid overload - Filed Weights   02/06/19 1806 02/07/19 0429  Weight: 65.5 kg 60.5 kg  5kg drop in 1 day in absence of significant diuresis  Likely due to technical factors 10. History oftobacco/cocaine use. Provide counseling 11. Hyperlipidemia. Lipitor  12.  Constipation reported per pt will eval laxative use 13.  Spasticity add low dose tizanidine LOS: 2 days A FACE TO FACE EVALUATION WAS  PERFORMED  [name] 02/08/2019, 8:49 AM

## 2019-02-09 ENCOUNTER — Inpatient Hospital Stay (HOSPITAL_COMMUNITY): Payer: Medicare Other | Admitting: Physical Therapy

## 2019-02-09 ENCOUNTER — Inpatient Hospital Stay (HOSPITAL_COMMUNITY): Payer: Medicare Other | Admitting: Speech Pathology

## 2019-02-09 ENCOUNTER — Inpatient Hospital Stay (HOSPITAL_COMMUNITY): Payer: Medicare Other | Admitting: Occupational Therapy

## 2019-02-09 DIAGNOSIS — G8110 Spastic hemiplegia affecting unspecified side: Secondary | ICD-10-CM

## 2019-02-09 DIAGNOSIS — I10 Essential (primary) hypertension: Secondary | ICD-10-CM

## 2019-02-09 DIAGNOSIS — F1910 Other psychoactive substance abuse, uncomplicated: Secondary | ICD-10-CM

## 2019-02-09 DIAGNOSIS — D72819 Decreased white blood cell count, unspecified: Secondary | ICD-10-CM

## 2019-02-09 DIAGNOSIS — D696 Thrombocytopenia, unspecified: Secondary | ICD-10-CM

## 2019-02-09 DIAGNOSIS — R7303 Prediabetes: Secondary | ICD-10-CM

## 2019-02-09 DIAGNOSIS — E46 Unspecified protein-calorie malnutrition: Secondary | ICD-10-CM

## 2019-02-09 DIAGNOSIS — I5032 Chronic diastolic (congestive) heart failure: Secondary | ICD-10-CM

## 2019-02-09 DIAGNOSIS — R0989 Other specified symptoms and signs involving the circulatory and respiratory systems: Secondary | ICD-10-CM

## 2019-02-09 DIAGNOSIS — E876 Hypokalemia: Secondary | ICD-10-CM

## 2019-02-09 LAB — COMPREHENSIVE METABOLIC PANEL
ALBUMIN: 3.1 g/dL — AB (ref 3.5–5.0)
ALT: 10 U/L (ref 0–44)
AST: 19 U/L (ref 15–41)
Alkaline Phosphatase: 52 U/L (ref 38–126)
Anion gap: 8 (ref 5–15)
BILIRUBIN TOTAL: 0.6 mg/dL (ref 0.3–1.2)
BUN: 9 mg/dL (ref 8–23)
CO2: 25 mmol/L (ref 22–32)
Calcium: 8.5 mg/dL — ABNORMAL LOW (ref 8.9–10.3)
Chloride: 102 mmol/L (ref 98–111)
Creatinine, Ser: 0.84 mg/dL (ref 0.44–1.00)
GFR calc Af Amer: >60 mL/min (ref >60)
GFR calc non Af Amer: >60 mL/min (ref >60)
GLUCOSE: 106 mg/dL — AB (ref 70–99)
Potassium: 3.4 mmol/L — ABNORMAL LOW (ref 3.5–5.1)
Sodium: 135 mmol/L (ref 135–145)
TOTAL PROTEIN: 5.8 g/dL — AB (ref 6.5–8.1)

## 2019-02-09 LAB — CBC WITH DIFFERENTIAL/PLATELET
Abs Immature Granulocytes: 0.01 10*3/uL (ref 0.00–0.07)
Basophils Absolute: 0 10*3/uL (ref 0.0–0.1)
Basophils Relative: 1 %
Eosinophils Absolute: 0 10*3/uL (ref 0.0–0.5)
Eosinophils Relative: 1 %
HCT: 41.5 % (ref 36.0–46.0)
Hemoglobin: 13.2 g/dL (ref 12.0–15.0)
Immature Granulocytes: 0 %
LYMPHS ABS: 1.1 10*3/uL (ref 0.7–4.0)
Lymphocytes Relative: 29 %
MCH: 25.5 pg — ABNORMAL LOW (ref 26.0–34.0)
MCHC: 31.8 g/dL (ref 30.0–36.0)
MCV: 80.3 fL (ref 80.0–100.0)
Monocytes Absolute: 0.6 10*3/uL (ref 0.1–1.0)
Monocytes Relative: 15 %
NRBC: 0 % (ref 0.0–0.2)
Neutro Abs: 2 10*3/uL (ref 1.7–7.7)
Neutrophils Relative %: 54 %
Platelets: 148 10*3/uL — ABNORMAL LOW (ref 150–400)
RBC: 5.17 MIL/uL — ABNORMAL HIGH (ref 3.87–5.11)
RDW: 13.9 % (ref 11.5–15.5)
WBC: 3.7 10*3/uL — ABNORMAL LOW (ref 4.0–10.5)

## 2019-02-09 MED ORDER — PRO-STAT SUGAR FREE PO LIQD
30.0000 mL | Freq: Two times a day (BID) | ORAL | Status: DC
  Administered 2019-02-09 – 2019-03-02 (×42): 30 mL via ORAL
  Filled 2019-02-09 (×42): qty 30

## 2019-02-09 MED ORDER — POTASSIUM CHLORIDE CRYS ER 20 MEQ PO TBCR
30.0000 meq | EXTENDED_RELEASE_TABLET | Freq: Two times a day (BID) | ORAL | Status: AC
  Administered 2019-02-09 (×2): 30 meq via ORAL
  Filled 2019-02-09 (×2): qty 1

## 2019-02-09 NOTE — IPOC Note (Signed)
Overall Plan of Care [facility]) Patient Details Name: [name] MRN: [id number] DOB: 03/28/1956  Admitting Diagnosis: <principal problem not specified>  Hospital Problems: Active Problems:   Acute right MCA stroke (HCC)   Thrombocytopenia (HCC)   Leukopenia   Hypoalbuminemia due to protein-calorie malnutrition (HCC)   Hypokalemia   Prediabetes   Spastic hemiplegia affecting nondominant side (HCC)   Chronic diastolic congestive heart failure (HCC)   Substance abuse (HCC)   Essential hypertension   Labile blood pressure     Functional Problem List: Nursing Bladder, Bowel, Medication Management  PT Balance, Behavior, Endurance, Motor, Nutrition, Safety, Sensory, Perception, Skin Integrity  OT Balance, Behavior, Cognition, Endurance, Motor, Pain, Perception, Safety, Sensory, Vision  SLP    TR         Basic ADL's: OT Grooming, Bathing, Dressing, Toileting     Advanced  ADL's: OT       Transfers: PT Bed Mobility, Bed to Chair, Car, [employer], Dietitian, Technical brewer: PT Ambulation, Psychologist, prison and probation services, Stairs     Additional Impairments: OT None  SLP Social Cognition, Communication expression Problem Solving, Memory, Attention, Awareness, Social Interaction  TR      Anticipated Outcomes Item Anticipated Outcome  Self Feeding n/a  Swallowing      Basic self-care  min A  Toileting  min A   Bathroom Transfers min A  Bowel/Bladder  Patient to manage bladder with min assist  Transfers  Min assist with LRAD   Locomotion  Supervision assist WC mobility. mod assist ambulation for short distances  Communication  Supervision   Cognition  Supervision  Pain  Patient pain <3   Safety/Judgment  Patient to remain free of falls   Therapy Plan: PT Intensity: Minimum of 1-2 x/day ,45 to 90 minutes PT Frequency: 5 out of 7 days PT Duration Estimated Length of Stay: 18-21 days  OT Intensity: Minimum of 1-2 x/day, 45 to 90 minutes OT  Frequency: 5 out of 7 days OT Duration/Estimated Length of Stay: 18-21 days SLP Intensity: Minumum of 1-2 x/day, 30 to 90 minutes SLP Frequency: 3 to 5 out of 7 days SLP Duration/Estimated Length of Stay: 2.5-3 weeks     Team Interventions: Nursing Interventions Bladder Management, Bowel Management, Pain Management, Medication Management, Patient/Family Education  PT interventions Ambulation/gait training, Warden/ranger, Cognitive remediation/compensation, Community reintegration, Functional electrical stimulation, Disease management/prevention, Discharge planning, DME/adaptive equipment instruction, Pain management, Functional mobility training, Psychosocial support, Therapeutic Activities, Splinting/orthotics, UE/LE Strength taining/ROM, Visual/perceptual remediation/compensation, Wheelchair propulsion/positioning, Therapeutic Exercise, Stair training, Neuromuscular re-education, Patient/family education, Skin care/wound management, UE/LE Coordination activities  OT Interventions Balance/vestibular training, Disease mangement/prevention, Neuromuscular re-education, Self Care/advanced ADL retraining, Therapeutic Exercise, Wheelchair propulsion/positioning, Cognitive remediation/compensation, DME/adaptive equipment instruction, Pain management, UE/LE Strength taining/ROM, Community reintegration, Development worker, international aid stimulation, Patient/family education, UE/LE Coordination activities, Discharge planning, Psychosocial support, Therapeutic Activities, Visual/perceptual remediation/compensation  SLP Interventions Cueing hierarchy, Functional tasks, Patient/family education, Therapeutic Activities, Cognitive remediation/compensation, Environmental controls, Internal/external aids, Speech/Language facilitation  TR Interventions    SW/CM Interventions Discharge Planning, Psychosocial Support, Patient/Family Education   Barriers to Discharge MD  Medical stability  Nursing      PT  Inaccessible home environment, Decreased caregiver support, Medical stability, Insurance for SNF coverage    OT Other (comments) drug use  SLP      SW       Team Discharge Planning: Destination: PT-Home ,OT- Home , SLP-Home Projected Follow-up: PT-Home health PT, OT-  Home health OT, SLP-24 hour supervision/assistance, Home  Health SLP Projected Equipment Needs: PT-Wheelchair cushion (measurements), Other (comment)(HW), OT- To be determined, SLP-None recommended by SLP Equipment Details: PT- , OT-  Patient/family involved in discharge planning: PT- Patient,  OT-Patient, SLP-Patient  MD ELOS: 18-21 days. 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 Patient used a straight point cane prior to admission. Presented 02/04/2019 with headache,increased left side weakness and frequent falls.CT/MRI showed acute on chronic cerebral infarction, nonhemorrhagic, right cerebral hemisphere. Acute nonhemorrhagic infarction involves the basal ganglia, residual posterior temporal occipital lobe as well as regional white matter. 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 Echocardiogram with ejection fraction of 60% without embolus. Urine drug screen was positive for cocaine. Neurology follow-up currently maintained on aspirin 325 mg daily for CVA prophylaxis.  Patient with resulting functional deficits with mobility, weakness, speech, self-care.  We will set goals for  Min a with PT/OT and  supervision with SLP.  See Team Conference Notes for weekly updates to the plan of care

## 2019-02-09 NOTE — Progress Notes (Signed)
Inpatient Rehabilitation  Patient information reviewed and entered into eRehab system by [facility]. Karen Kays., CCC/SLP, PPS Coordinator.  Information including medical coding, functional ability and quality indicators will be reviewed and updated through discharge.    Per nursing patient was given "Data Collection Information Summary" for Patients in Inpatient Rehabilitation Facilities with attached "Privacy Act Statement-Health Care Records" upon admission, which was present in Patient Resource Notebook.

## 2019-02-09 NOTE — Care Management (Signed)
Inpatient Rehabilitation Center Individual Statement of Services  Patient Name:  NAOMA DIGIROLAMO  Date:  02/09/2019  Welcome to the Inpatient Rehabilitation Center.  Our goal is to provide you with an individualized program based on your diagnosis and situation, designed to meet your specific needs.  With this comprehensive rehabilitation program, you will be expected to participate in at least 3 hours of rehabilitation therapies Monday-Friday, with modified therapy programming on the weekends.  Your rehabilitation program will include the following services:  Physical Therapy (PT), Occupational Therapy (OT), Speech Therapy (ST), 24 hour per day rehabilitation nursing, Therapeutic Recreaction (TR), Neuropsychology, Case Management (Social Worker), Rehabilitation Medicine, Nutrition Services and Pharmacy Services  Weekly team conferences will be held on Wednesdays to discuss your progress.  Your Social Worker will talk with you frequently to get your input and to update you on team discussions.  Team conferences with you and your family in attendance may also be held.  Expected length of stay: 18-21 days   Overall anticipated outcome: minimal assistance  Depending on your progress and recovery, your program may change. Your Social Worker will coordinate services and will keep you informed of any changes. Your Social Worker's name and contact numbers are listed  below.  The following services may also be recommended but are not provided by the Inpatient Rehabilitation Center:   Driving Evaluations  Home Health Rehabiltiation Services  Outpatient Rehabilitation Services   Arrangements will be made to provide these services after discharge if needed.  Arrangements include referral to agencies that provide these services.  Your insurance has been verified to be:  [facility] Medicare; Medicaid Your primary doctor is:  Engineer, civil (consulting)  Pertinent information will be shared with your doctor and your  insurance company.  Social Worker:  [place], [place] [phone] or ([phone]   Information discussed with and copy given to patient by: Amada Jupiter, 02/09/2019, 3:32 PM

## 2019-02-09 NOTE — Progress Notes (Addendum)
Social Work  Social Work Assessment and Plan  Patient Details  Name: [name] MRN: [id number] Date of Birth: 08-01-1956  Today's Date: 02/09/2019  Problem List:  Patient Active Problem List   Diagnosis Date Noted  . Thrombocytopenia (HCC)   . Leukopenia   . Hypoalbuminemia due to protein-calorie malnutrition (HCC)   . Hypokalemia   . Prediabetes   . Spastic hemiplegia affecting nondominant side (HCC)   . Chronic diastolic congestive heart failure (HCC)   . Substance abuse (HCC)   . Essential hypertension   . Labile blood pressure   . Aneurysm, cerebral, nonruptured 02/06/2019  . Acute right MCA stroke (HCC) 02/06/2019  . 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 Right carotid artery occlusion 02/05/2019  . Left hemiparesis (HCC) 02/05/2019  . CVA (cerebral vascular accident) (HCC) 02/03/2019   Past Medical History:  Past Medical History:  Diagnosis Date  . Blood transfusion without reported diagnosis   . CHF (congestive heart failure) (HCC)   . Hypertension   . Stroke [facility])    Past Surgical History:  Past Surgical History:  Procedure Laterality Date  . ABDOMINAL SURGERY     Social History:  reports that she has been smoking. She has never used smokeless tobacco. She reports that she does not drink alcohol or use drugs.  Family / Support Systems Marital Status: Single(but has a boyfriend of 23 yrs together) Patient Roles: Partner Spouse/Significant Other: bf, [name] @ [phone] Children: (Pt reports that she has one daughter who lives locally, however, no contact with her.) Other Supports: CAP aide;  of note, [name] does have his two sisters living in the home as well, however, they are not caregivers for pt. Anticipated Caregiver: [name] [phone], and hired caregivers Ability/Limitations of Caregiver: [name] can provide up to min assist Caregiver Availability: 24/7 Family Dynamics: Pt notes she has a good relationship with [name]'s sisters but they "have  their own health problems."  Social History Preferred language: English Religion: Methodist Cultural Background: NA Read: Yes Write: Yes Employment Status: Disabled Date Retired/Disabled/Unemployed: has not worked since Psychologist, sport and exercise Issues: None Guardian/Conservator: none - per MD, pt is capable of making decisions on her own behalf   Abuse/Neglect Abuse/Neglect Assessment Can Be Completed: Yes Physical Abuse: Denies Verbal Abuse: Denies Sexual Abuse: Denies Exploitation of patient/patient's resources: Denies Self-Neglect: Denies  Emotional Status Pt's affect, behavior and adjustment status: Pt lying in bed and able to answer general assessment questions.  She keeps eyes closed majority of the time but answers are clear.  She does not report or appear to be in any significant emotional distress.  Will monitor and refer for neuropsychology as indicated. Recent Psychosocial Issues: Stroke in 2012 Psychiatric History: Pt endorses h/o schizophrenia and bipolar diagnoses.  States she has never been hospitalized purely for these issues and has not received any ongoing mental health support.  States that her primary MD manages her medications. Substance Abuse History: Pt aware that she tested postive for cocaine. Per chart, pt has admitted to using cocaine q~ 2-3 days and she confirms this with me.  Per pt, no one in her household is using drugs and that "I have people that bring it to me."  She has never pursued or received any SA treatment.  She states she understands how her drug use greatly increases her chance for another stroke or other health issues and she "Plan to quit."  She does not feel she will have any trouble with cessation, however,  will provide with resources.  Patient / Family Perceptions, Expectations & Goals Pt/Family understanding of illness & functional limitations: Pt and bf with good, general understanding of her stroke, resulting functional  limitations/ need for CIR.  (she was on CIR following prior stroke in 2012) Premorbid pt/family roles/activities: Pt states she does have an aide 5d/week who assists with b/d and meal prep.   Anticipated changes in roles/activities/participation: Boyfriend and aide may need to increase the amount of physical assistance they provide but will resume their caregiver roles. Pt/family expectations/goals: "I just want to get home."  [employer]: None Premorbid Home Care/DME Agencies: Other (Comment)CAP aide through [facility] [phone]) Transportation available at discharge: yes Resource referrals recommended: Neuropsychology  Discharge Planning Living Arrangements: Spouse/significant other, Non-relatives/Friends Support Systems: Spouse/significant other, Friends/neighbors Type of Residence: Private residence Insurance Resources: [employer], [employer] (specify county) Architect: [employer], [employer] Financial Screen Referred: No Living Expenses: Lives with family Money Management: Significant Other Does the patient have any problems obtaining your medications?: No Home Management: CAP aide and boyfriend Patient/Family Preliminary Plans: Pt to return home with boyfriend and his sisters.  Plan to resume CAP aide services as well. Social Work Anticipated Follow Up Needs: HH/OP Expected length of stay: 18-21 days  Clinical Impression Unfortunate woman here following a stroke and h/o CVA in 2012 (on CIR at that time as well).  Has good support from her boyfriend with whom she lives as well as having CAP aide 40 hrs/ week.  Pt talks openly about her cocaine use and able to verbalize the know risks of continuing to use.  Pt states plans to stop and does not feel she will have a difficult time doing this as "people have to bring it to me."   BF and aide able to cover 24/7 support.  Will follow for support, d/c planning needs and SA counseling/ resource  referrals.  [name] 02/09/2019, 3:30 PM

## 2019-02-09 NOTE — Progress Notes (Signed)
Speech Language Pathology Daily Session Note  Patient Details  Name: [name] MRN: [id number] Date of Birth: June 08, 1956  Today's Date: 02/09/2019 SLP Individual Time: 0930-1025 SLP Individual Time Calculation (min): 55 min  Short Term Goals: Week 1: SLP Short Term Goal 1 (Week 1): Patient will utilize speech intelligibility strategies at the phrase level with achieve ~90% intelligibility with Min A verbal cues.  SLP Short Term Goal 2 (Week 1): Patient will demonstrate sustained attention to tasks for ~30 minutes with Mod A verbal cues.  SLP Short Term Goal 3 (Week 1): Patient will initiate functional tasks with supervision verbal cues.  SLP Short Term Goal 4 (Week 1): Patient will demonstrate functional problem solving for basic and familiar tasks with Min A verbal cues.  SLP Short Term Goal 5 (Week 1): Patient will attend to left field of enviornment during functional tasks with Min A verbal cues.   Skilled Therapeutic Interventions: Skilled treatment session focused on cognitive goals. Upon arrival, patient was asleep in bed and slow to arouse. SLP facilitated session by providing Max multimodal cues and repositioning for arousal. Patient was administered the MoCA-Basic and scored 16/30 points with a score of 26 or above considered normal. Patient demonstrated deficits in problem solving, recall as well of visual-perceptual tasks. Function impacted by significant visual deficits that patient is demonstrating improved awareness of. SLP also facilitated session by providing Mod-Max A verbal and visual cues for problem solving and attention to left field of environment during a sorting task from a field of 4. Patient left upright in bed with alarm on and all needs within reach. Continue with current plan of care.      Pain Pain Assessment Pain Scale: 0-10 Pain Score: 4  Pain Type: Acute pain Pain Location: Head Pain Descriptors / Indicators: Aching Pain Onset: Gradual Patients Stated  Pain Goal: 3 Pain Intervention(s): Medication (See eMAR)  Therapy/Group: Individual Therapy  Zeya Balles 02/09/2019, 10:40 AM

## 2019-02-09 NOTE — Progress Notes (Signed)
Occupational Therapy Session Note  Patient Details  Name: [name] MRN: [id number] Date of Birth: 09/02/1956  Today's Date: 02/09/2019 OT Individual Time: 1300-1356 OT Individual Time Calculation (min): 56 min    Short Term Goals: Week 1:  OT Short Term Goal 1 (Week 1): Pt will perform toilet transfer with max A in order to decrease level of assist with functional transfers. OT Short Term Goal 2 (Week 1): Pt will maintain dynamic balance during functional tasks seated EOB with min A overall. OT Short Term Goal 3 (Week 1): Pt will perform UB dressing with mod A in order to decrease level of assist with self care.  Skilled Therapeutic Interventions/Progress Updates:    Pt greeted sitting in wc finishing lunch tray and agreeable to OT treatment session. Pt agreeable to bathing/dressing at the sink, but declined to shower. OT set pt up with wash cloth and she was able to wash face, chest, abdomin, and L arm. L UE with flexor tone which pt reports has been present since her first stroke. Education provided for ways to use L UE as a stabilizer. Attempted 1 sit<>stand with max A and Max A for balance. Pt noted to be incontinent of bladder and soaked through gown and wc cushion. Utilized Stedy for sit<>stand with max A to achieve full hip extension and only able to use R UE to pull up into standing. Total A for peri-care and brief change in standing. Pt transferred to EOB with Stedy. Total A to thread pant legs, then max A to stand without stedy while OT assisted with pulling up pants. Shirt donned seated EOB with min/mod A for sitting balance 2/2 lateral LOB to L and noted pusher tendencies, max A to don shirt. Pt returned to supine with max A to lift LEs into bed. Pt left semi-reclined in bed with bed alarm on and needs met.   Therapy Documentation Precautions:  Precautions Precautions: Fall Restrictions Weight Bearing Restrictions: No Pain: Pain Assessment Pain Scale: 0-10 Pain Score: 4   Pain Type: Acute pain Pain Location: Head Pain Orientation: Left Pain Descriptors / Indicators: Aching Pain Onset: On-going Patients Stated Pain Goal: 2 Pain Intervention(s): rest, repositioned   Therapy/Group: Individual Therapy   S  02/09/2019, 1:56 PM  

## 2019-02-09 NOTE — Discharge Instructions (Signed)
Inpatient Rehab Discharge Instructions  [facility] Discharge date and time: No discharge date for patient encounter.   Activities/Precautions/ Functional Status: Activity: activity as tolerated Diet: regular diet Wound Care: none needed Functional status:  ___ No restrictions     ___ Walk up steps independently ___ 24/7 supervision/assistance   ___ Walk up steps with assistance ___ Intermittent supervision/assistance  ___ Bathe/dress independently ___ Walk with walker     _x__ Bathe/dress with assistance ___ Walk Independently    ___ Shower independently ___ Walk with assistance    ___ Shower with assistance ___ No alcohol     ___ Return to work/school ________  Special Instructions: No driving, no smoking alcohol or illicit drug products STROKE/TIA DISCHARGE INSTRUCTIONS SMOKING Cigarette smoking nearly doubles your risk of having a stroke & is the single most alterable risk factor  If you smoke or have smoked in the last 12 months, you are advised to quit smoking for your health.  Most of the excess cardiovascular risk related to smoking disappears within a year of stopping.  Ask you doctor about anti-smoking medications  [place] Quit Line: 1-800-QUIT NOW  Free Smoking Cessation Classes [phone]  CHOLESTEROL Know your levels; limit fat & cholesterol in your diet  Lipid Panel     Component Value Date/Time   CHOL 168 02/04/2019 0510   TRIG 103 02/04/2019 0510   HDL 55 02/04/2019 0510   CHOLHDL 3.1 02/04/2019 0510   VLDL 21 02/04/2019 0510   LDLCALC 92 02/04/2019 0510      Many patients benefit from treatment even if their cholesterol is at goal.  Goal: Total Cholesterol (CHOL) less than 160  Goal:  Triglycerides (TRIG) less than 150  Goal:  HDL greater than 40  Goal:  LDL (LDLCALC) less than 100   BLOOD PRESSURE American Stroke Association blood pressure target is less that 120/80 mm/Hg  Your discharge blood pressure is:  BP: (!) 174/89  Monitor your blood  pressure  Limit your salt and alcohol intake  Many individuals will require more than one medication for high blood pressure  DIABETES (A1c is a blood sugar average for last 3 months) Goal HGBA1c is under 7% (HBGA1c is blood sugar average for last 3 months)  Diabetes: No known diagnosis of diabetes    Lab Results  Component Value Date   HGBA1C 5.8 (H) 02/04/2019     Your HGBA1c can be lowered with medications, healthy diet, and exercise.  Check your blood sugar as directed by your physician  Call your physician if you experience unexplained or low blood sugars.  PHYSICAL ACTIVITY/REHABILITATION Goal is 30 minutes at least 4 days per week  Activity: Increase activity slowly, Therapies: Physical Therapy: Home Health Return to work:   Activity decreases your risk of heart attack and stroke and makes your heart stronger.  It helps control your weight and blood pressure; helps you relax and can improve your mood.  Participate in a regular exercise program.  Talk with your doctor about the best form of exercise for you (dancing, walking, swimming, cycling).  DIET/WEIGHT Goal is to maintain a healthy weight  Your discharge diet is:  Diet Order            Diet regular Room service appropriate? Yes with Assist; Fluid consistency: Thin  Diet effective now              liquids Your height is:    Your current weight is: Weight: 60.5 kg Your Body Mass  Index (BMI) is:  BMI (Calculated): 24.39  Following the type of diet specifically designed for you will help prevent another stroke.  Your goal weight range is:    Your goal Body Mass Index (BMI) is 19-24.  Healthy food habits can help reduce 3 risk factors for stroke:  High cholesterol, hypertension, and excess weight.  RESOURCES Stroke/Support Group:  Call [phone]   STROKE EDUCATION PROVIDED/REVIEWED AND GIVEN TO PATIENT Stroke warning signs and symptoms How to activate emergency medical system (call 911). Medications  prescribed at discharge. Need for follow-up after discharge. Personal risk factors for stroke. Pneumonia vaccine given:  Flu vaccine given:  My questions have been answered, the writing is legible, and I understand these instructions.  I will adhere to these goals & educational materials that have been provided to me after my discharge from the hospital.      My questions have been answered and I understand these instructions. I will adhere to these goals and the provided educational materials after my discharge from the hospital.  Patient/Caregiver Signature _______________________________ Date __________  Clinician Signature _______________________________________ Date __________  Please bring this form and your medication list with you to all your follow-up doctor's appointments.

## 2019-02-09 NOTE — Progress Notes (Signed)
Physical Therapy Session Note  Patient Details  Name: [name] MRN: [id number] Date of Birth: 01-19-1956  Today's Date: 02/09/2019 PT Individual Time: 1150-1220 AND [phone] PT Individual Time Calculation (min): 30 min AND 40 min  Short Term Goals: Week 1:  PT Short Term Goal 1 (Week 1): Pt will perform bed mobility with mod assist  PT Short Term Goal 2 (Week 1): Pt will transfer to [facility] with mod assist  PT Short Term Goal 3 (Week 1): Pt will initate Gait training  PT Short Term Goal 4 (Week 1): Pt will propell WC 142ft with min assist   Skilled Therapeutic Interventions/Progress Updates:   Session 1:  Pt in supine and agreeable to therapy, no c/o pain. Max assist transfer to EOB and to w/c via stand/squat pivot. Verbal and tactile cues for technique and manual assist needed for weight shifting. Total assist w/c transport to/from therapy gym. Session focused on w/c propulsion training. Switched to lower 16x16 w/c to allow for greater R foot contact to floor and hard back for trunk support. Instructed on self-propulsion via R hemi technique. Tactile cues for initiation and coordination w/ using R foot, fading to just manual assist for propulsion 2/2 global weakness. Pt self-propelled w/ max assist 67' w/ 2 rest breaks. Returned to room total assist and ended session in w/c and all needs in reach.   Session 2:  Pt in supine and agreeable to therapy, no c/o pain. Max assist bed mobility and transfer to w/c via stedy. Total assist w/c transport to/from therapy gym for time management. Worked on sit<>stands and NMR at rail in hallway. Sit<>stands w/ min assist and maintained static stance w/ mod assist. Pre-gait tasks included lateral weight shifting and R forward and backward stepping w/ tactile and manual cues for L quad activation in weight acceptance/SLS. Pt tolerated only 20-30 sec of standing at a time before impulsively sitting down in chair. Tactile and verbal cues for upright posture  and manual cues for neutral hip extension in stance. Ambulated 5' w/ max manual assist for lateral weight shifting, LLE knee block and placement of step. Returned to room via w/c. Assisted w/ brushing out hair as pt wishing to wash it soon. Ended session in w/c, all needs in reach.  Therapy Documentation Precautions:  Precautions Precautions: Fall Restrictions Weight Bearing Restrictions: No Vital Signs: Therapy Vitals Pulse Rate: 79 BP: 131/81 Patient Position (if appropriate): Lying Pain: Pain Assessment Pain Score: 4   Therapy/Group: Individual Therapy  Oniyah Rohe Melton Krebs 02/09/2019, 12:21 PM

## 2019-02-09 NOTE — Progress Notes (Signed)
[place] PHYSICAL MEDICINE & REHABILITATION PROGRESS NOTE   Subjective/Complaints: Patient seen laying in bed this morning.  She states she slept well overnight.  She states that a good weekend.  She states he had a mild headache this morning.  ROS: + Headache.  Denies CP, shortness of breath, nausea, vomiting, diarrhea.  Objective:   No results found. Recent Labs    02/09/19 0546  WBC 3.7*  HGB 13.2  HCT 41.5  PLT 148*   Recent Labs    02/09/19 0546  NA 135  K 3.4*  CL 102  CO2 25  GLUCOSE 106*  BUN 9  CREATININE 0.84  CALCIUM 8.5*    Intake/Output Summary (Last 24 hours) at 02/09/2019 1058 Last data filed at 02/09/2019 0900 Gross per 24 hour  Intake 720 ml  Output -  Net 720 ml     Physical Exam: Vital Signs Blood pressure 131/81, pulse 79, temperature 98.3 F (36.8 C), temperature source Oral, resp. rate 18, weight 60.5 kg, SpO2 98 %. Constitutional: No distress . Vital signs reviewed. HENT: Normocephalic.  Atraumatic. Eyes: EOMI. No discharge. Cardiovascular: RRR. No JVD. Respiratory: CTA Bilaterally. Normal effort. GI: BS +. Non-distended. Musc: No edema or tenderness in extremities. Neurologic: Alert and oriented Dysarthria Motor:  5/5 in RIght  deltoid, bicep, tricep, grip, hip flexor, knee extensors, ankle dorsiflexor and plantar flexor 0/5 LUE/LLE Skin: Warm and dry.  Intact.  Assessment/Plan: 1. Functional deficits secondary to RIght Basal ganglia infarct which require 3+ hours per day of interdisciplinary therapy in a comprehensive inpatient rehab setting.  Physiatrist is providing close team supervision and 24 hour management of active medical problems listed below.  Physiatrist and rehab team continue to assess barriers to discharge/monitor patient progress toward functional and medical goals  Care Tool:  Bathing    Body parts bathed by patient: Chest, Front perineal area, Abdomen, Buttocks, Right upper leg, Left upper leg   Body  parts bathed by helper: Right arm, Left arm, Right lower leg, Left lower leg, Face     Bathing assist Assist Level: Maximal Assistance - Patient 24 - 49%     Upper Body Dressing/Undressing Upper body dressing Upper body dressing/undressing activity did not occur (including orthotics): Refused What is the patient wearing?: Hospital gown only    Upper body assist Assist Level: Maximal Assistance - Patient 25 - 49%    Lower Body Dressing/Undressing Lower body dressing      What is the patient wearing?: Incontinence brief     Lower body assist Assist for lower body dressing: Total Assistance - Patient < 25%     Toileting Toileting    Toileting assist Assist for toileting: Dependent - Patient 0%     Transfers Chair/bed transfer  Transfers assist     Chair/bed transfer assist level: Maximal Assistance - Patient 25 - 49%     Locomotion Ambulation   Ambulation assist   Ambulation activity did not occur: Safety/medical concerns          Walk 10 feet activity   Assist  Walk 10 feet activity did not occur: Safety/medical concerns        Walk 50 feet activity   Assist Walk 50 feet with 2 turns activity did not occur: Safety/medical concerns         Walk 150 feet activity   Assist Walk 150 feet activity did not occur: Safety/medical concerns         Walk 10 feet on uneven surface  activity  Assist Walk 10 feet on uneven surfaces activity did not occur: Safety/medical concerns         Wheelchair     Assist Will patient use wheelchair at discharge?: Yes Type of Wheelchair: Manual Wheelchair activity did not occur: Refused         Wheelchair 50 feet with 2 turns activity    Assist    Wheelchair 50 feet with 2 turns activity did not occur: Refused       Wheelchair 150 feet activity     Assist Wheelchair 150 feet activity did not occur: Safety/medical concerns        Medical Problem List and Plan: 1.Left side  weaknesssecondary to multiple right brain infarcts adjacent to previous right MCA infarcts on 02/04/2019 in the setting of 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 Follow-up outpatient interventional radiology for ICA aneurysm  Continue CIR  WHO, PRAFO ordered for left side for spasticity/contracture mgt  Notes reviewed- stroke with history of cocaine use, images reviewed- multiple right-sided infarcts, acute on chronic, labs reviewed 2. Antithrombotics: -DVT/anticoagulation:Subcutaneous Lovenox -antiplatelet therapy: Aspirin 325 mg daily 3. Pain Management:Lyrica 75 mg daily, Tylenol as needed 4. Mood/bipolar disorder -antipsychotic agents: Risperdal 1 mg daily at bedtime 5. Neuropsych: This patientiscapable of making decisions on herown behalf. 6. Skin/Wound Care:Routine skin checks 7. Fluids/Electrolytes/Nutrition:Routine in and out's  8. Hypertension. Monitor with increased mobility. Patient on Norvasc 5 mg daily, Coreg 12.5 mg twice a day prior to admission. Resume as needed Vitals:   02/09/19 0905 02/09/19 0916  BP: (!) 129/91 131/81  Pulse: 95 79  Resp:    Temp:    SpO2:     Labile on 3/30, monitor for trend 9. Diastolic congestive heart failure. Monitor for any signs of fluid overload Filed Weights   02/06/19 1806 02/07/19 0429  Weight: 65.5 kg 60.5 kg   ?  Reliability, daily weights ordered 10. History oftobacco/cocaine use. Provide counseling 11. Hyperlipidemia. Lipitor 12.  Constipation reported per pt will eval laxative use 13.  Spasticity   Added low dose tizanidine 14.  Prediabetes  Borderline, will consider changing to carb modified diet 15.  Hypokalemia  Potassium 3.4 on 3/30  Supplemented x1 day 16.  Hypoalbuminemia  Supplement initiated on 3/30 17.  Leukopenia  WBCs 3.7 on 3/30  Continue to monitor 18.  Thrombocytopenia  Platelets 148 on 3/30  Continue to monitor  LOS:  3 days A FACE TO FACE EVALUATION WAS PERFORMED  Helix Lafontaine Karis Juba 02/09/2019, 10:58 AM

## 2019-02-09 NOTE — Progress Notes (Signed)
Physical Therapy Session Note  Patient Details  Name: [name] MRN: [id number] Date of Birth: 1956-03-28  Today's Date: 02/09/2019 PT Individual Time: 0850-0919 PT Individual Time Calculation (min): 29 min   Short Term Goals: Week 1:  PT Short Term Goal 1 (Week 1): Pt will perform bed mobility with mod assist  PT Short Term Goal 2 (Week 1): Pt will transfer to [facility] with mod assist  PT Short Term Goal 3 (Week 1): Pt will initate Gait training  PT Short Term Goal 4 (Week 1): Pt will propell WC 140ft with min assist   Skilled Therapeutic Interventions/Progress Updates:    Pt received supine in bed and agreeable to therapy session. Pt reports 8.5/10 pain in frontal head region with RN notified. At end of therapy session pt reports minimal improvement of frontal head pain to 8/10. Pt performed supine to sit using bed features with max assist for trunk control and L LE management with cuing for sequencing. Pt tolerated sitting at EOB for ~13 minutes with vitals assessed throughout session as described below. Pt performed seated balance task of reaching toward external target with R UE requiring min to max assist for trunk control without UE support and pt demonstrating impaired L attention. Pt performed lateral scooting at EOB with min assist for R scooting and up to max assist for L. Pt performed sit to supine with max assist for trunk control and LE management with cuing for sequencing. Performed supine bridging progressed to supine scooting with therapist facilitating L LE placement with facilitation for increased weightbearing and increased muscular activation. Performed supine heel cord stretch 2 x for improved ankle ROM. Pt left supine in bed with needs in reach and nursing staff notified of need for soft call bed and bed alarm on.   Therapy Documentation Precautions:  Precautions Precautions: Fall Restrictions Weight Bearing Restrictions: No  Vital Signs: Supine at beginning:  BP 144/84 Sitting: 131/92 with pt reports of dizziness  Sitting after ~2minutes 129/91 Supine at end of session: 131/81  Therapy/Group: Individual Therapy  Ginny Forth, PT, DPT 02/09/2019, 7:57 AM

## 2019-02-10 ENCOUNTER — Inpatient Hospital Stay (HOSPITAL_COMMUNITY): Payer: Medicare Other | Admitting: Speech Pathology

## 2019-02-10 ENCOUNTER — Inpatient Hospital Stay (HOSPITAL_COMMUNITY): Payer: Medicare Other | Admitting: Occupational Therapy

## 2019-02-10 ENCOUNTER — Inpatient Hospital Stay (HOSPITAL_COMMUNITY): Payer: Medicare Other | Admitting: Physical Therapy

## 2019-02-10 DIAGNOSIS — I169 Hypertensive crisis, unspecified: Secondary | ICD-10-CM

## 2019-02-10 MED ORDER — BACLOFEN 5 MG HALF TABLET
5.0000 mg | ORAL_TABLET | Freq: Three times a day (TID) | ORAL | Status: DC
  Administered 2019-02-10 – 2019-02-12 (×6): 5 mg via ORAL
  Filled 2019-02-10 (×6): qty 1

## 2019-02-10 MED ORDER — AMLODIPINE BESYLATE 2.5 MG PO TABS
2.5000 mg | ORAL_TABLET | Freq: Every day | ORAL | Status: DC
  Administered 2019-02-10 – 2019-02-12 (×3): 2.5 mg via ORAL
  Filled 2019-02-10 (×3): qty 1

## 2019-02-10 MED ORDER — TOPIRAMATE 25 MG PO TABS
25.0000 mg | ORAL_TABLET | Freq: Two times a day (BID) | ORAL | Status: DC
  Administered 2019-02-10 – 2019-02-12 (×5): 25 mg via ORAL
  Filled 2019-02-10 (×5): qty 1

## 2019-02-10 NOTE — Progress Notes (Signed)
Physical Therapy Session Note  Patient Details  Name: [name] MRN: [id number] Date of Birth: [birth year]  Today's Date: 02/10/2019 PT Individual Time: 0802-0919 PT Individual Time Calculation (min): 77 min   Short Term Goals: Week 1:  PT Short Term Goal 1 (Week 1): Pt will perform bed mobility with mod assist  PT Short Term Goal 2 (Week 1): Pt will transfer to [facility] with mod assist  PT Short Term Goal 3 (Week 1): Pt will initate Gait training  PT Short Term Goal 4 (Week 1): Pt will propell WC 139ft with min assist   Skilled Therapeutic Interventions/Progress Updates:    Pt received supine in bed and agreeable to therapy session. Pt reported 7/10 frontal headache pain at beginning of session that increased to 8.5/10 during therapy session - RN notified and present for medication administration; therapist provided breaks throughout session for pain management. Vitals assessed in supine at beginning of session BP 164/91 (MAP 112), HR 80bpm. Performed LB dressing supine in bed with therapist assisting L LE placement for bridging and manual facilitation for weightbearing with max assist for clothing management - pt threading R LE in pants and using R UE to assist with pulling clothing around hips. Performed rolling R and L with mod assist using bedrails while therapist finished LB dressing. Performed supine to sit with mod assist for B LE management and trunk upright with cuing for increased use of R UE to support trunk.  Performed squat pivot transfer bed to w/c with max assist for lifting and pivoting with multimodal cuing for sequencing. Reassessed vitals BP 169/85, HR 75bpm. Transported to therapy gym in w/c for time management and energy conservation. Reassessed vitals BP 183/87, HR 75 bpm and reassessed a few minutes later BP 166/78 HR 75bpm. Performed seated balance reaching task with R UE focusing on L attention, cross body reaching with trunk control all with close supervision and occasional  min assist due to L posterolateral LOB. Performed repeated sit<>stand x7 from EOM to no AD with handheld R UE support, manual facilitation and cuing for sequencing, blocking R LE knee buckling with facilitation for improved knee extension in stance, and max assist for lifting and balance upon standing - performed static standing balance with visual feedback for midline orientation with pt unable to achieve full upright maintaining forward flexed posture and B LE knee flexion despite max cuing and manual facilitation with pt only tolerating static standing for <30 seconds at a time. Performed blocked practice transfers w/c<>bed with pt requiring max assist transfering to the L and mod assist squat pivot to the R. Pt left sitting in w/c with needs in reach and seat belt alarm on.  Therapy Documentation Precautions:  Precautions Precautions: Fall Restrictions Weight Bearing Restrictions: No   Therapy/Group: Individual Therapy  Ginny Forth, PT, DPT  02/10/2019, 8:31 AM

## 2019-02-10 NOTE — Progress Notes (Signed)
[place] PHYSICAL MEDICINE & REHABILITATION PROGRESS NOTE   Subjective/Complaints: Patient seen laying in bed this morning.  She states she slept well overnight.  She states she wore orthosis overnight.  ROS: Denies CP, shortness of breath, nausea, vomiting, diarrhea.  Objective:   No results found. Recent Labs    02/09/19 0546  WBC 3.7*  HGB 13.2  HCT 41.5  PLT 148*   Recent Labs    02/09/19 0546  NA 135  K 3.4*  CL 102  CO2 25  GLUCOSE 106*  BUN 9  CREATININE 0.84  CALCIUM 8.5*    Intake/Output Summary (Last 24 hours) at 02/10/2019 0917 Last data filed at 02/09/2019 1835 Gross per 24 hour  Intake 440 ml  Output -  Net 440 ml     Physical Exam: Vital Signs Blood pressure (!) 180/95, pulse 83, temperature 99 F (37.2 C), resp. rate 18, weight 62.4 kg, SpO2 98 %. Constitutional: No distress . Vital signs reviewed. HENT: Normocephalic.  Atraumatic. Eyes: EOMI. No discharge. Cardiovascular: RRR.  No JVD. Respiratory: CTA bilaterally.  Normal effort. GI: BS +. Non-distended. Musc: No edema or tenderness in extremities. Neurologic: Alert and oriented Dysarthria Motor:  5/5 RUE/RLE 0/5 LUE/LLE Increased tone LUE/LLE Skin: Warm and dry.  Intact.  Assessment/Plan: 1. Functional deficits secondary to RIght Basal ganglia infarct which require 3+ hours per day of interdisciplinary therapy in a comprehensive inpatient rehab setting.  Physiatrist is providing close team supervision and 24 hour management of active medical problems listed below.  Physiatrist and rehab team continue to assess barriers to discharge/monitor patient progress toward functional and medical goals  Care Tool:  Bathing    Body parts bathed by patient: Chest, Front perineal area, Abdomen, Buttocks, Right upper leg, Left upper leg   Body parts bathed by helper: Right arm, Left arm, Right lower leg, Left lower leg, Face     Bathing assist Assist Level: Maximal Assistance - Patient 24 -  49%     Upper Body Dressing/Undressing Upper body dressing Upper body dressing/undressing activity did not occur (including orthotics): Refused What is the patient wearing?: Hospital gown only    Upper body assist Assist Level: Maximal Assistance - Patient 25 - 49%    Lower Body Dressing/Undressing Lower body dressing      What is the patient wearing?: Incontinence brief     Lower body assist Assist for lower body dressing: Total Assistance - Patient < 25%     Toileting Toileting    Toileting assist Assist for toileting: Dependent - Patient 0%     Transfers Chair/bed transfer  Transfers assist     Chair/bed transfer assist level: Maximal Assistance - Patient 25 - 49%     Locomotion Ambulation   Ambulation assist   Ambulation activity did not occur: Safety/medical concerns  Assist level: Maximal Assistance - Patient 25 - 49% Assistive device: (rail in hallway) Max distance: 5'   Walk 10 feet activity   Assist  Walk 10 feet activity did not occur: Safety/medical concerns        Walk 50 feet activity   Assist Walk 50 feet with 2 turns activity did not occur: Safety/medical concerns         Walk 150 feet activity   Assist Walk 150 feet activity did not occur: Safety/medical concerns         Walk 10 feet on uneven surface  activity   Assist Walk 10 feet on uneven surfaces activity did not occur: Safety/medical concerns  Wheelchair     Assist Will patient use wheelchair at discharge?: Yes Type of Wheelchair: Manual Wheelchair activity did not occur: Refused  Wheelchair assist level: Maximal Assistance - Patient 25 - 49% Max wheelchair distance: 32'    Wheelchair 50 feet with 2 turns activity    Assist    Wheelchair 50 feet with 2 turns activity did not occur: Refused   Assist Level: Maximal Assistance - Patient 25 - 49%   Wheelchair 150 feet activity     Assist Wheelchair 150 feet activity did not occur:  Safety/medical concerns        Medical Problem List and Plan: 1.Left side weaknesssecondary to multiple right brain infarcts adjacent to previous right MCA infarcts on 02/04/2019 in the setting of 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 Follow-up outpatient interventional radiology for ICA aneurysm  Continue CIR  WHO, PRAFO ordered for left side for spasticity/contracture mgt 2. Antithrombotics: -DVT/anticoagulation:Subcutaneous Lovenox -antiplatelet therapy: Aspirin 325 mg daily 3. Pain Management:Lyrica 75 mg daily, Tylenol as needed 4. Mood/bipolar disorder -antipsychotic agents: Risperdal 1 mg daily at bedtime 5. Neuropsych: This patientiscapable of making decisions on herown behalf. 6. Skin/Wound Care:Routine skin checks 7. Fluids/Electrolytes/Nutrition:Routine in and out's  8. Hypertension. Monitor with increased mobility. Patient on Norvasc 5 mg daily, Coreg 12.5 mg twice a day prior to admission. Resume as needed Vitals:   02/10/19 0834 02/10/19 0852  BP: (!) 166/78 (!) 180/95  Pulse: 75 83  Resp:    Temp:    SpO2:     Norvasc 2.5 started on 3/31  Labile with hypertensive crisis this a.m. 9. Diastolic congestive heart failure. Monitor for any signs of fluid overload Filed Weights   02/06/19 1806 02/07/19 0429 02/10/19 0753  Weight: 65.5 kg 60.5 kg 62.4 kg   ?  Reliability on 3/31 10. History oftobacco/cocaine use. Provide counseling 11. Hyperlipidemia. Lipitor 12.  Constipation: Improving 13.  Spasticity   Tizanidine DC'd  Baclofen 5 3 times daily started on 3/31 14.  Prediabetes  Borderline, will consider changing to carb modified diet if necessary 15.  Hypokalemia  Potassium 3.4 on 3/30  Supplemented x1 day 16.  Hypoalbuminemia  Supplement initiated on 3/30 17.  Leukopenia  WBCs 3.7 on 3/30  Continue to monitor 18.  Thrombocytopenia  Platelets 148 on 3/30  Continue to  monitor  LOS: 4 days A FACE TO FACE EVALUATION WAS PERFORMED  Prudencio Velazco Karis Juba 02/10/2019, 9:17 AM

## 2019-02-10 NOTE — Progress Notes (Signed)
Occupational Therapy Session Note  Patient Details  Name: [name] MRN: [id number] Date of Birth: 1955-11-20  Today's Date: 02/10/2019 OT Individual Time: 1030-1155 OT Individual Time Calculation (min): 85 min    Short Term Goals: Week 1:  OT Short Term Goal 1 (Week 1): Pt will perform toilet transfer with max A in order to decrease level of assist with functional transfers. OT Short Term Goal 2 (Week 1): Pt will maintain dynamic balance during functional tasks seated EOB with min A overall. OT Short Term Goal 3 (Week 1): Pt will perform UB dressing with mod A in order to decrease level of assist with self care.  Skilled Therapeutic Interventions/Progress Updates:    Pt greeted seated in wc, with bottom scooted almost all the way out of the chair. OT provided knee block to L side and facilitated anterior weight shift in order to scoot back in chair. OT then went and got patient deeper wc for increased safety when seated in chair. Pt agreeable to shower today. Pt reported she did not have to use the bathroom, but had noted to be incontinent of bladder. Stedy used to transfer pt onto commode with mod A to come to standing. Pt able to void bladder successfully and was able to complete peri-care with OT assistance to spread L leg 2/2 L hip adduction tone. Stedy used to then transfer pt from commode to wide BSC in shower used as shower seat. Bathing completed with pt able to wash most of upper body using R UE (severe L UE flexor tone from previous stroke). Pt able to was buttocks and peri-area using leaning method and use of cut out on commode seat. OT assist to was LEs. Stedy transfer out of shower to new wc. Worked on dressing strategies with pt needing assist to thread L side of body into clothing. Attempted sit<>stand without Stedy, but pt unable to power up in order for OT to pull up pants. Pt with noted increased tone in L LE at times when weight bearing. Stedy used to stand again to pull up  pants. Pt able to brush hair in sitting with min A, then OT assisted with braiding hair. Pt left seated in wc at end of session with safety alarm belt on and needs met.   Therapy Documentation Precautions:  Precautions Precautions: Fall Restrictions Weight Bearing Restrictions: No Pain: Denies pain  Therapy/Group: Individual Therapy  Valma Cava 02/10/2019, 12:30 PM

## 2019-02-10 NOTE — Progress Notes (Signed)
Speech Language Pathology Daily Session Note  Patient Details  Name: [name] MRN: [id number] Date of Birth: June 08, 1956  Today's Date: 02/10/2019 SLP Individual Time: 1400-1425 SLP Individual Time Calculation (min): 25 min  Short Term Goals: Week 1: SLP Short Term Goal 1 (Week 1): Patient will utilize speech intelligibility strategies at the phrase level with achieve ~90% intelligibility with Min A verbal cues.  SLP Short Term Goal 2 (Week 1): Patient will demonstrate sustained attention to tasks for ~30 minutes with Mod A verbal cues.  SLP Short Term Goal 3 (Week 1): Patient will initiate functional tasks with supervision verbal cues.  SLP Short Term Goal 4 (Week 1): Patient will demonstrate functional problem solving for basic and familiar tasks with Min A verbal cues.  SLP Short Term Goal 5 (Week 1): Patient will attend to left field of enviornment during functional tasks with Min A verbal cues.   Skilled Therapeutic Interventions:Skilled treatment session focused on cognitive goals. Upon arrival, patient was sitting upright in the wheelchair consuming her lunch meal. SLP facilitated session by providing overall Mod A verbal cues and extra time to locate items in her right field of environment on her tray. Patient also alternated attention between self-feeding and conversation with supervision verbal cues for 20 minutes. Patient left upright in wheelchair with alarm on and all needs within reach. Continue with current plan of care.      Pain No/Denies Pain   Therapy/Group: Individual Therapy  Basha Krygier 02/10/2019, 4:19 PM

## 2019-02-11 ENCOUNTER — Inpatient Hospital Stay (HOSPITAL_COMMUNITY): Payer: Medicare Other | Admitting: Physical Therapy

## 2019-02-11 ENCOUNTER — Inpatient Hospital Stay (HOSPITAL_COMMUNITY): Payer: Medicare Other

## 2019-02-11 ENCOUNTER — Inpatient Hospital Stay (HOSPITAL_COMMUNITY): Payer: Medicare Other | Admitting: Speech Pathology

## 2019-02-11 DIAGNOSIS — G441 Vascular headache, not elsewhere classified: Secondary | ICD-10-CM

## 2019-02-11 NOTE — Progress Notes (Signed)
Called to room,  Pt staring off to left side with tremors noted to mouth and right arm, VS charted, PA notified and new orders received.

## 2019-02-11 NOTE — Progress Notes (Signed)
[facility] PHYSICAL MEDICINE & REHABILITATION PROGRESS NOTE   Subjective/Complaints: Patient seen laying in bed this morning.  She states she slept well overnight.  She engages very little this morning.  ROS: Denies CP, shortness of breath, nausea, vomiting, diarrhea.  Objective:   No results found. Recent Labs    02/09/19 0546  WBC 3.7*  HGB 13.2  HCT 41.5  PLT 148*   Recent Labs    02/09/19 0546  NA 135  K 3.4*  CL 102  CO2 25  GLUCOSE 106*  BUN 9  CREATININE 0.84  CALCIUM 8.5*    Intake/Output Summary (Last 24 hours) at 02/11/2019 1030 Last data filed at 02/11/2019 0900 Gross per 24 hour  Intake 540 ml  Output -  Net 540 ml     Physical Exam: Vital Signs Blood pressure (!) 159/79, pulse 78, temperature 99.4 F (37.4 C), temperature source Oral, resp. rate 19, weight 64.4 kg, SpO2 96 %. Constitutional: No distress . Vital signs reviewed. HENT: Normocephalic.  Atraumatic. Eyes: EOMI. No discharge. Cardiovascular: RRR.  No JVD. Respiratory: CTA bilaterally.  Normal effort. GI: BS +. Non-distended. Musc: No edema or tenderness in extremities. Neurologic: Alert and oriented Dysarthria Motor:  5/5 RUE/RLE, unchanged 0/5 LUE/LLE, unchanged Increased tone LUE/LLE Skin: Warm and dry.  Intact.  Assessment/Plan: 1. Functional deficits secondary to RIght Basal ganglia infarct which require 3+ hours per day of interdisciplinary therapy in a comprehensive inpatient rehab setting.  Physiatrist is providing close team supervision and 24 hour management of active medical problems listed below.  Physiatrist and rehab team continue to assess barriers to discharge/monitor patient progress toward functional and medical goals  Care Tool:  Bathing    Body parts bathed by patient: Chest, Front perineal area, Abdomen, Buttocks, Right upper leg, Left upper leg   Body parts bathed by helper: Right arm, Left arm, Right lower leg, Left lower leg, Face     Bathing assist  Assist Level: Maximal Assistance - Patient 24 - 49%     Upper Body Dressing/Undressing Upper body dressing Upper body dressing/undressing activity did not occur (including orthotics): Refused What is the patient wearing?: Hospital gown only    Upper body assist Assist Level: Maximal Assistance - Patient 25 - 49%    Lower Body Dressing/Undressing Lower body dressing      What is the patient wearing?: Incontinence brief     Lower body assist Assist for lower body dressing: Total Assistance - Patient < 25%     Toileting Toileting    Toileting assist Assist for toileting: Dependent - Patient 0%     Transfers Chair/bed transfer  Transfers assist     Chair/bed transfer assist level: Maximal Assistance - Patient 25 - 49%     Locomotion Ambulation   Ambulation assist   Ambulation activity did not occur: Safety/medical concerns  Assist level: Maximal Assistance - Patient 25 - 49% Assistive device: (rail in hallway) Max distance: 5'   Walk 10 feet activity   Assist  Walk 10 feet activity did not occur: Safety/medical concerns        Walk 50 feet activity   Assist Walk 50 feet with 2 turns activity did not occur: Safety/medical concerns         Walk 150 feet activity   Assist Walk 150 feet activity did not occur: Safety/medical concerns         Walk 10 feet on uneven surface  activity   Assist Walk 10 feet on uneven surfaces activity  did not occur: Safety/medical concerns         Wheelchair     Assist Will patient use wheelchair at discharge?: Yes Type of Wheelchair: Manual Wheelchair activity did not occur: Refused  Wheelchair assist level: Maximal Assistance - Patient 25 - 49% Max wheelchair distance: 68'    Wheelchair 50 feet with 2 turns activity    Assist    Wheelchair 50 feet with 2 turns activity did not occur: Refused   Assist Level: Maximal Assistance - Patient 25 - 49%   Wheelchair 150 feet activity      Assist Wheelchair 150 feet activity did not occur: Safety/medical concerns        Medical Problem List and Plan: 1.Left side weaknesssecondary to multiple right brain infarcts adjacent to previous right MCA infarcts on 02/04/2019 in the setting of 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 Follow-up outpatient interventional radiology for ICA aneurysm  Continue CIR  WHO, PRAFO ordered for left side for spasticity/contracture mgt  Team conference today to discuss current and goals and coordination of care, home and environmental barriers, and discharge planning with nursing, case manager, and therapies.  2. Antithrombotics: -DVT/anticoagulation:Subcutaneous Lovenox -antiplatelet therapy: Aspirin 325 mg daily 3. Pain Management:Lyrica 75 mg daily, Tylenol as needed  Topamax started on 3/31 4. Mood/bipolar disorder -antipsychotic agents: Risperdal 1 mg daily at bedtime 5. Neuropsych: This patientiscapable of making decisions on herown behalf. 6. Skin/Wound Care:Routine skin checks 7. Fluids/Electrolytes/Nutrition:Routine in and out's  8. Hypertension. Monitor with increased mobility. Patient on Norvasc 5 mg daily, Coreg 12.5 mg twice a day prior to admission. Resume as needed Vitals:   02/10/19 1907 02/11/19 0352  BP: (!) 161/76 (!) 159/79  Pulse: 81 78  Resp: 16 19  Temp: 98.3 F (36.8 C) 99.4 F (37.4 C)  SpO2: 98% 96%   Norvasc 2.5 started on 3/31  Elevated, but ?improving on 4/1 9. Diastolic congestive heart failure. Monitor for any signs of fluid overload Filed Weights   02/07/19 0429 02/10/19 0753 02/11/19 0352  Weight: 60.5 kg 62.4 kg 64.4 kg   ?  Reliability on 4/1 10. History oftobacco/cocaine use. Provide counseling 11. Hyperlipidemia. Lipitor 12.  Constipation: Improving 13.  Spasticity   Tizanidine DC'd  Baclofen 5 3 times daily started on 3/31 14.   Prediabetes  Borderline, will consider changing to carb modified diet if necessary 15.  Hypokalemia  Potassium 3.4 on 3/30  Supplemented x1 day  Labs ordered for tomorrow 16.  Hypoalbuminemia  Supplement initiated on 3/30 17.  Leukopenia  WBCs 3.7 on 3/30  Labs ordered for tomorrow  Continue to monitor 18.  Thrombocytopenia  Platelets 148 on 3/30  Labs ordered for tomorrow  Continue to monitor  LOS: 5 days A FACE TO FACE EVALUATION WAS PERFORMED  Evans Levee [name] 02/11/2019, 10:30 AM

## 2019-02-11 NOTE — Progress Notes (Signed)
Physical Therapy Session Note  Patient Details  Name: [name] MRN: [id number] Date of Birth: 09-May-1956  Today's Date: 02/11/2019      Short Term Goals: Week 1:  PT Short Term Goal 1 (Week 1): Pt will perform bed mobility with mod assist  PT Short Term Goal 2 (Week 1): Pt will transfer to [facility] with mod assist  PT Short Term Goal 3 (Week 1): Pt will initate Gait training  PT Short Term Goal 4 (Week 1): Pt will propell WC 174ft with min assist   Skilled Therapeutic Interventions/Progress Updates: Pt off unit for CT scan, pt missed 45 minutes skilled therapy.      Therapy Documentation Precautions:  Precautions Precautions: Fall Restrictions Weight Bearing Restrictions: (P) No General: PT Amount of Missed Time (min): 45 Minutes PT Missed Treatment Reason: CT/MRI Vital Signs: Therapy Vitals Pulse Rate: 66 BP: (!) 164/90 Patient Position (if appropriate): Sitting Oxygen Therapy SpO2: 97 % O2 Device: Room Air Pain: Pain Assessment Pain Scale: 0-10 Pain Score: 8  Pain Type: Acute pain Pain Location: Head Pain Onset: On-going Pain Intervention(s): RN made aware    Therapy/Group: Individual Therapy  Jayleah Garbers  Roda Lauture, PTA  02/11/2019, 2:36 PM

## 2019-02-11 NOTE — Progress Notes (Signed)
Occupational Therapy Session Note  Patient Details  Name: [name] MRN: [id number] Date of Birth: May 15, 1956  Today's Date: 02/11/2019 OT Individual Time: 0813-8871 OT Individual Time Calculation (min): 75 min    Short Term Goals: Week 1:  OT Short Term Goal 1 (Week 1): Pt will perform toilet transfer with max A in order to decrease level of assist with functional transfers. OT Short Term Goal 2 (Week 1): Pt will maintain dynamic balance during functional tasks seated EOB with min A overall. OT Short Term Goal 3 (Week 1): Pt will perform UB dressing with mod A in order to decrease level of assist with self care.  Skilled Therapeutic Interventions/Progress Updates:    1:1. Pt received n bed. Pt c/o HA but does not rate. RN alerted for medication. Pt supine>sitting with MAX A and sits EOB for self feeding braekfast. OT sets up tray with all needed items on L of midline and pt requires min VC for locating items and termination of tasks such as shaking salt packet and napkin. Pt reaches with RUE by leaning backward when crossing midline requiring VC for correction. Pt completes sit tos tan in stedy with MOD A to transfer into w/c. Pt requires intermitant CGA for sitting balnce throughout EOB/stedy tasks. Pt completes bathing with A to wash RUE, buttocks and B feet. Pt standing washing peri area in stedy with MOD A. Pt requires max A to don pull over shirt demoing decreased arousal during task. Pt able to thread RLE into pants and during standing clothing management pt has incontinent bladder void. Total A to clean d/t time constraints. Pt returned to bed via stedy. Exited session with pt seated in bed, call light in reach and all needs met  Therapy Documentation Precautions:  Precautions Precautions: Fall Restrictions Weight Bearing Restrictions: (P) No General:   Vital Signs:  Pain:   ADL:   Vision     Therapy/Group: Individual Therapy  Tonny Branch 02/11/2019, 9:26  AM

## 2019-02-11 NOTE — Progress Notes (Addendum)
Physical Therapy Session Note  Patient Details  Name: [name] MRN: [id number] Date of Birth: 10/15/1956  Today's Date: 02/11/2019 PT Missed Time: 60 Minutes Missed Time Reason: Pain;Patient unwilling to participate(8/10 HA)  Pt refusing to participate in therapy this session 2/2 8/10 headache pain. Pt engaged minimally with this therapist, which is abnormal for her. Offered multiple options for activity, pt continued to decline. Missed 60 min of skilled PT. Made RN aware of HA pain. Per RN, tylenol given approx 1 hr ago.   Jameela Michna Melton Krebs 02/11/2019, 10:50 AM

## 2019-02-11 NOTE — Plan of Care (Signed)
Due to the current state of emergency, patients may not be receiving their 3-hours of Medicare-mandated therapy.   

## 2019-02-11 NOTE — Progress Notes (Signed)
Pt more alert and responding. Pt able to eat meal and seems to swallow okay to eat, however seems left neglect may be more pronounced. Dinner tray had to be turned for her to see food on other side of plate.

## 2019-02-11 NOTE — Progress Notes (Signed)
Speech Language Pathology Daily Session Note  Patient Details  Name: [name] MRN: [id number] Date of Birth: 1956-09-23  Today's Date: 02/11/2019 SLP Individual Time: 1300-1330 SLP Individual Time Calculation (min): 30 min and Today's Date: 02/11/2019 SLP Missed Time: 30 Minutes Missed Time Reason: Patient fatigue  Short Term Goals: Week 1: SLP Short Term Goal 1 (Week 1): Patient will utilize speech intelligibility strategies at the phrase level with achieve ~90% intelligibility with Min A verbal cues.  SLP Short Term Goal 2 (Week 1): Patient will demonstrate sustained attention to tasks for ~30 minutes with Mod A verbal cues.  SLP Short Term Goal 3 (Week 1): Patient will initiate functional tasks with supervision verbal cues.  SLP Short Term Goal 4 (Week 1): Patient will demonstrate functional problem solving for basic and familiar tasks with Min A verbal cues.  SLP Short Term Goal 5 (Week 1): Patient will attend to left field of enviornment during functional tasks with Min A verbal cues.   Skilled Therapeutic Interventions: Skilled treatment session focused on cognitive goals. Upon arrival, patient was asleep in bed and slow to arouse. Patient would open her eyes but required more than a reasonable amount of time to respond and appeared to be staring with minimal blinking with multiple repetitions needed for response. Patient repositioned in bed with total +2 assist due to patient not able to follow directions.  Patient's tray placed in front of her with Max verbal cues needed for initiation and for problem solving with tray set-up (couldn't remove lid from tray and attempting to eat pasta with hands). Patient also lethargic throughout session with Max verbal cues needed for arousal and response with mild tremor noted in RUE and oral cavity. RN aware and vitals taken. PA also aware. Patient missed remaining 30 minutes of session due to lethargy. Patient left upright in bed with alarm on.  Continue with current plan of care.      Pain Pain Assessment Pain Scale: 0-10 Pain Score: 8  Pain Type: Acute pain Pain Location: Head Pain Onset: On-going Pain Intervention(s): RN made aware  Therapy/Group: Individual Therapy  Salisha Bardsley 02/11/2019, 2:22 PM

## 2019-02-12 ENCOUNTER — Inpatient Hospital Stay (HOSPITAL_COMMUNITY): Payer: Medicare Other | Admitting: Occupational Therapy

## 2019-02-12 ENCOUNTER — Inpatient Hospital Stay (HOSPITAL_COMMUNITY): Payer: Medicare Other | Admitting: Physical Therapy

## 2019-02-12 ENCOUNTER — Inpatient Hospital Stay (HOSPITAL_COMMUNITY): Payer: Medicare Other | Admitting: Speech Pathology

## 2019-02-12 LAB — BASIC METABOLIC PANEL
Anion gap: 7 (ref 5–15)
BUN: 13 mg/dL (ref 8–23)
CO2: 23 mmol/L (ref 22–32)
Calcium: 8.5 mg/dL — ABNORMAL LOW (ref 8.9–10.3)
Chloride: 102 mmol/L (ref 98–111)
Creatinine, Ser: 0.81 mg/dL (ref 0.44–1.00)
GFR calc Af Amer: >60 mL/min (ref >60)
GFR calc non Af Amer: >60 mL/min (ref >60)
Glucose, Bld: 113 mg/dL — ABNORMAL HIGH (ref 70–99)
Potassium: 3.8 mmol/L (ref 3.5–5.1)
Sodium: 132 mmol/L — ABNORMAL LOW (ref 135–145)

## 2019-02-12 LAB — CBC WITH DIFFERENTIAL/PLATELET
Abs Immature Granulocytes: 0.01 10*3/uL (ref 0.00–0.07)
Basophils Absolute: 0 10*3/uL (ref 0.0–0.1)
Basophils Relative: 1 %
Eosinophils Absolute: 0 10*3/uL (ref 0.0–0.5)
Eosinophils Relative: 1 %
HCT: 38.5 % (ref 36.0–46.0)
Hemoglobin: 12.7 g/dL (ref 12.0–15.0)
Immature Granulocytes: 0 %
Lymphocytes Relative: 28 %
Lymphs Abs: 1.1 10*3/uL (ref 0.7–4.0)
MCH: 26.8 pg (ref 26.0–34.0)
MCHC: 33 g/dL (ref 30.0–36.0)
MCV: 81.2 fL (ref 80.0–100.0)
Monocytes Absolute: 0.5 10*3/uL (ref 0.1–1.0)
Monocytes Relative: 13 %
Neutro Abs: 2.3 10*3/uL (ref 1.7–7.7)
Neutrophils Relative %: 57 %
Platelets: 173 10*3/uL (ref 150–400)
RBC: 4.74 MIL/uL (ref 3.87–5.11)
RDW: 14.1 % (ref 11.5–15.5)
WBC: 4 10*3/uL (ref 4.0–10.5)
nRBC: 0 % (ref 0.0–0.2)

## 2019-02-12 MED ORDER — AMLODIPINE BESYLATE 5 MG PO TABS
5.0000 mg | ORAL_TABLET | Freq: Every day | ORAL | Status: DC
  Administered 2019-02-13 – 2019-02-23 (×11): 5 mg via ORAL
  Filled 2019-02-12 (×11): qty 1

## 2019-02-12 MED ORDER — AMLODIPINE BESYLATE 2.5 MG PO TABS
2.5000 mg | ORAL_TABLET | Freq: Once | ORAL | Status: AC
  Administered 2019-02-12: 2.5 mg via ORAL
  Filled 2019-02-12: qty 1

## 2019-02-12 NOTE — Progress Notes (Signed)
[place] PHYSICAL MEDICINE & REHABILITATION PROGRESS NOTE   Subjective/Complaints: Patient seen laying in bed this morning.  She slept well overnight.  He continues to be flat with limited engagement.  Discussed with SLP and PA regarding lethargy as well as AMS yesterday.  CT head ordered yesterday showing evolving infarct.  ROS: Limited due to cognition.  Objective:   Ct Head Wo Contrast  Result Date: 02/11/2019 CLINICAL DATA:  63 year old female EXAM: CT HEAD WITHOUT CONTRAST TECHNIQUE: Contiguous axial images were obtained from the base of the skull through the vertex without intravenous contrast. COMPARISON:  CT imaging 02/03/2019, MR imaging 02/03/2019, with additional prior CT 10/26/2011 FINDINGS: Brain: Redemonstration of encephalomalacia of the majority of the right hemisphere with ex vacuo dilation of the right lateral ventricle and cortical thinning. Evolution of acute/subacute infarction of the paramedian right posterior frontal and parietal lobe as well as within the right corona radiata extending towards posterior limb. No acute intracranial hemorrhage.  No midline shift. Confluent hypodensity in the periventricular white matter. Vascular: No significant intracranial atherosclerosis. Skull: No acute displaced fracture. Sinuses/Orbits: Unremarkable Other: None IMPRESSION: No acute intracranial abnormality. Evolution of the evolving infarction of right hemisphere, as above. Similar appearance of chronic changes of remote right MCA infarction and expansion of the right lateral ventricle, with background changes of chronic microvascular ischemic disease. Electronically Signed   By: Gilmer Mor D.O.   On: 02/11/2019 14:37   Recent Labs    02/12/19 0435  WBC 4.0  HGB 12.7  HCT 38.5  PLT 173   Recent Labs    02/12/19 0435  NA 132*  K 3.8  CL 102  CO2 23  GLUCOSE 113*  BUN 13  CREATININE 0.81  CALCIUM 8.5*    Intake/Output Summary (Last 24 hours) at 02/12/2019 1250 Last  data filed at 02/12/2019 0819 Gross per 24 hour  Intake 220 ml  Output -  Net 220 ml     Physical Exam: Vital Signs Blood pressure (!) 155/70, pulse 68, temperature 98.1 F (36.7 C), resp. rate 16, weight 64.4 kg, SpO2 96 %. Constitutional: No distress . Vital signs reviewed. HENT: Normocephalic.  Atraumatic. Eyes: EOMI. No discharge. Cardiovascular: RRR.  No JVD. Respiratory: CTA bilaterally.  Normal effort. GI: BS +. Non-distended. Musc: No edema or tenderness in extremities. Neurologic: Alert and oriented Dysarthria Motor:  0/5 LUE/LLE, stable Increased tone LUE/LLE, unchanged Skin: Warm and dry.  Intact.  Assessment/Plan: 1. Functional deficits secondary to RIght Basal ganglia infarct which require 3+ hours per day of interdisciplinary therapy in a comprehensive inpatient rehab setting.  Physiatrist is providing close team supervision and 24 hour management of active medical problems listed below.  Physiatrist and rehab team continue to assess barriers to discharge/monitor patient progress toward functional and medical goals  Care Tool:  Bathing    Body parts bathed by patient: Chest, Front perineal area, Abdomen, Buttocks, Right upper leg, Left upper leg   Body parts bathed by helper: Right arm, Left arm, Right lower leg, Left lower leg, Face     Bathing assist Assist Level: Maximal Assistance - Patient 24 - 49%     Upper Body Dressing/Undressing Upper body dressing Upper body dressing/undressing activity did not occur (including orthotics): Refused What is the patient wearing?: Hospital gown only    Upper body assist Assist Level: Maximal Assistance - Patient 25 - 49%    Lower Body Dressing/Undressing Lower body dressing      What is the patient wearing?: Incontinence brief  Lower body assist Assist for lower body dressing: Total Assistance - Patient < 25%     Toileting Toileting    Toileting assist Assist for toileting: Dependent - Patient 0%      Transfers Chair/bed transfer  Transfers assist     Chair/bed transfer assist level: Maximal Assistance - Patient 25 - 49%     Locomotion Ambulation   Ambulation assist   Ambulation activity did not occur: Safety/medical concerns  Assist level: Maximal Assistance - Patient 25 - 49% Assistive device: (rail in hallway) Max distance: 5'   Walk 10 feet activity   Assist  Walk 10 feet activity did not occur: Safety/medical concerns        Walk 50 feet activity   Assist Walk 50 feet with 2 turns activity did not occur: Safety/medical concerns         Walk 150 feet activity   Assist Walk 150 feet activity did not occur: Safety/medical concerns         Walk 10 feet on uneven surface  activity   Assist Walk 10 feet on uneven surfaces activity did not occur: Safety/medical concerns         Wheelchair     Assist Will patient use wheelchair at discharge?: Yes Type of Wheelchair: Manual Wheelchair activity did not occur: Refused  Wheelchair assist level: Maximal Assistance - Patient 25 - 49% Max wheelchair distance: 23'    Wheelchair 50 feet with 2 turns activity    Assist    Wheelchair 50 feet with 2 turns activity did not occur: Refused   Assist Level: Maximal Assistance - Patient 25 - 49%   Wheelchair 150 feet activity     Assist Wheelchair 150 feet activity did not occur: Safety/medical concerns        Medical Problem List and Plan: 1.Left side weaknesssecondary to multiple right brain infarcts adjacent to previous right MCA infarcts on 02/04/2019 in the setting of 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 Follow-up outpatient interventional radiology for ICA aneurysm  Continue CIR  WHO, PRAFO ordered for left side for spasticity/contracture mgt  Repeat head CT 4/1 reviewed, showing evolving infarct as well as chronic infarct.  Will consider MRI if symptoms do not improve 2.  Antithrombotics: -DVT/anticoagulation:Subcutaneous Lovenox -antiplatelet therapy: Aspirin 325 mg daily 3. Pain Management:Lyrica 75 mg daily, Tylenol as needed  Topamax started on 3/31, DC'd on 4/2 due to?  Lethargy 4. Mood/bipolar disorder -antipsychotic agents: Risperdal 1 mg daily at bedtime 5. Neuropsych: This patientiscapable of making decisions on herown behalf. 6. Skin/Wound Care:Routine skin checks 7. Fluids/Electrolytes/Nutrition:Routine in and out's  8. Hypertension. Monitor with increased mobility. Patient on Norvasc 5 mg daily, Coreg 12.5 mg twice a day prior to admission. Resume as needed Vitals:   02/12/19 0416 02/12/19 0502  BP: (!) 172/82 (!) 155/70  Pulse:    Resp:    Temp:    SpO2:     Norvasc 2.5 started on 3/31, increased to 5 on 4/2  Labile with hypertensive crisis this a.m. 9. Diastolic congestive heart failure. Monitor for any signs of fluid overload Filed Weights   02/07/19 0429 02/10/19 0753 02/11/19 0352  Weight: 60.5 kg 62.4 kg 64.4 kg   ?  Reliability on 4/1, no weights for today 10. History oftobacco/cocaine use. Provide counseling 11. Hyperlipidemia. Lipitor 12.  Constipation: Improving 13.  Spasticity   Tizanidine DC'd  Baclofen 5 3 times daily started on 3/31, DC'd on 4/2 due to?  Lethargy  14.  Prediabetes  Borderline, will consider changing to carb modified diet if necessary 15.  Hypokalemia  Potassium 3.8 on 4/2 after supplementation  Labs ordered for tomorrow 16.  Hypoalbuminemia  Supplement initiated on 3/30 17.  Leukopenia: Resolved  WBCs 4.0 on 4/2  Continue to monitor 18.  Thrombocytopenia: Resolved  Platelets 173 on 4/2  Continue to monitor 19.  Hyponatremia  Sodium 132 on 4/2  Continue to monitor  LOS: 6 days A FACE TO FACE EVALUATION WAS PERFORMED  Gideon Burstein Karis Jubanil Yichen Gilardi 02/12/2019, 12:50 PM

## 2019-02-12 NOTE — Progress Notes (Signed)
Speech Language Pathology Weekly Progress and Session Note  Patient Details  Name: [name] MRN: [id number] Date of Birth: May 21, 1956  Beginning of progress report period: February 05, 2019 End of progress report period: February 12, 2019  Today's Date: 02/12/2019 SLP Individual Time: 1191-4782 SLP Individual Time Calculation (min): 42 min and Today's Date: 02/12/2019 SLP Missed Time: 18 Minutes Missed Time Reason: Patient fatigue  Short Term Goals: Week 1: SLP Short Term Goal 1 (Week 1): Patient will utilize speech intelligibility strategies at the phrase level with achieve ~90% intelligibility with Min A verbal cues.  SLP Short Term Goal 1 - Progress (Week 1): Met SLP Short Term Goal 2 (Week 1): Patient will demonstrate sustained attention to tasks for ~30 minutes with Mod A verbal cues.  SLP Short Term Goal 2 - Progress (Week 1): Not met SLP Short Term Goal 3 (Week 1): Patient will initiate functional tasks with supervision verbal cues.  SLP Short Term Goal 3 - Progress (Week 1): Met SLP Short Term Goal 4 (Week 1): Patient will demonstrate functional problem solving for basic and familiar tasks with Min A verbal cues.  SLP Short Term Goal 4 - Progress (Week 1): Not met SLP Short Term Goal 5 (Week 1): Patient will attend to left field of enviornment during functional tasks with Min A verbal cues.  SLP Short Term Goal 5 - Progress (Week 1): Not met    New Short Term Goals: Week 2: SLP Short Term Goal 1 (Week 2): Patient will utilize speech intelligibility strategies at the sentence level with achieve ~90% intelligibility with Min A verbal cues.  SLP Short Term Goal 2 (Week 2): Patient will demonstrate sustained attention to tasks for ~15 minutes with Mod A verbal cues.  SLP Short Term Goal 3 (Week 2): Patient will demonstrate functional problem solving for basic and familiar tasks with Min A verbal cues.  SLP Short Term Goal 4 (Week 2): Patient will attend to left field of enviornment  during functional tasks with Min A verbal cues.  SLP Short Term Goal 5 (Week 2): Patient will respond to questions, etc in 75% of opportunities with Max A verbal cues.   Weekly Progress Updates: Patient has made slow and inconsistent gains and has met 2 of 5 STGs this reporting period. Currently, patient demonstrates improved speech intelligibility and is overall 90% intelligible at the phrase level with Min A verbal cues for use of an increased vocal intensity. Patient demonstrates improved initiation with functional tasks but continues to require Max A verbal cues and more than a reasonable amount of time to initiate verbal responses. Mod-Max A verbal cues are also needed for attention to left field of environment, sustained attention and functional problem solving. Patient's progress has been impacted by lethargy this reporting period, PA and MD aware. Patient would benefit from continued skilled SLP intervention to maximize her cognitive functioning and speech intelligibility prior to discharge home.      Intensity: Minumum of 1-2 x/day, 30 to 90 minutes Frequency: 3 to 5 out of 7 days Duration/Length of Stay: 02/27/19 Treatment/Interventions: Cueing hierarchy;Functional tasks;Patient/family education;Therapeutic Activities;Cognitive remediation/compensation;Environmental controls;Internal/external aids;Speech/Language facilitation   Daily Session  Skilled Therapeutic Interventions: Skilled treatment session focused on speech intelligibility and cognitive goals. SLP facilitated session by providing Max A verbal cues for overall arousal and sustained attention to tasks for ~1-2 minute intervals. Patient participated in a verbal description task in which she was 90% intelligible at the phrase level with overall Min A verbal cues  for an increased vocal intensity and Max A verbal cues to attend to left field of environment and to initiate verbal responses. Patient extremely lethargic this session,  therefore, patient missed remaining 18 mins of session. MD aware. Patient left upright in bed with alar on and all needs within reach. Continue with current plan of care.       Pain No/Denies Pain   Therapy/Group: Individual Therapy  Kersti Scavone 02/12/2019, 11:07 AM

## 2019-02-12 NOTE — Progress Notes (Signed)
Occupational Therapy Session Note  Patient Details  Name: [name] MRN: [id number] Date of Birth: 11-09-1956  Today's Date: 02/12/2019 OT Individual Time: 1115-1200 OT Individual Time Calculation (min): 45 min  and Today's Date: 02/12/2019 OT Missed Time: 45 Minutes Missed Time Reason: Patient fatigue  Short Term Goals: Week 1:  OT Short Term Goal 1 (Week 1): Pt will perform toilet transfer with max A in order to decrease level of assist with functional transfers. OT Short Term Goal 2 (Week 1): Pt will maintain dynamic balance during functional tasks seated EOB with min A overall. OT Short Term Goal 3 (Week 1): Pt will perform UB dressing with mod A in order to decrease level of assist with self care.  Skilled Therapeutic Interventions/Progress Updates:    Attempted to see patient initially at 10:30 for scheduled OT session. Pt would open eyes briefly, but refused to participate. OT returned at 11:15 and was able to get pt to participate by offering to braid her hair sitting EOB. Pt came to sitting EOB with increased time and assistance for LLE advancement. Pt sat EOB for 20 mins while participating in grooming tasks. Pt needed overall supervision with intermittent CGA to maintain sitting balance. Pt assisted some with hair brushing and washed her face . Pt then agreeable to get to wc. Stedy used for sit<>stand with min A, then transferred to wc.  Pt noted to have been incontinent of bladder. Continued working on standing balance while OT changed brief and provided peri-care. Pt requests to put pants on. Total A to don pants, then mod A to stand from lower wc using Stedy. Pt agreeable to stay up in wc with safety alarm belt on and call bell in reach. Pt able to verbalize how to call the nursing staff for assistance.   Therapy Documentation Precautions:  Precautions Precautions: Fall Restrictions Weight Bearing Restrictions: No General: General OT Amount of Missed Time: 45 Minutes PT  Missed Treatment Reason: Patient unwilling to participate   Therapy/Group: Individual Therapy  Mal Amabile 02/12/2019, 12:10 PM

## 2019-02-12 NOTE — Progress Notes (Signed)
Physical Therapy Session Note  Patient Details  Name: Tracy BUFKIN MRN: [id number] Date of Birth: 05/26/1956  Today's Date: 02/12/2019 PT Individual Time: 0800-0815 AND 1330-1415 PT Individual Time Calculation (min): 15 min  AND 45 min and Today's Date: 02/12/2019 PT Missed Time: 15 Minutes Missed Time Reason: Patient unwilling to participate  Short Term Goals: Week 1:  PT Short Term Goal 1 (Week 1): Pt will perform bed mobility with mod assist  PT Short Term Goal 2 (Week 1): Pt will transfer to [facility] with mod assist  PT Short Term Goal 3 (Week 1): Pt will initate Gait training  PT Short Term Goal 4 (Week 1): Pt will propell WC 195ft with min assist   Skilled Therapeutic Interventions/Progress Updates:   Session 1:  Pt sitting up in bed, eating breakfast. Pt offering minimal engagement and eye contact with this therapist, declined any activity. Pt alert and demonstrated no evidence of pain in her behavior, stated "my head hurts". Pain 7/10 and pt has just taken Tylenol. Pt did not respond to majority of therapist's questions. Missed 15 min of skilled PT 2/2 refusal. When asked to participate in this afternoon's session, pt stated "maybe".  Session 2:  Pt sitting in w/c and eating lunch upon arrival, states she feels better this afternoon. Pt more lethargic appearing and noted to have split food over chest, LEs, and using fork to attempt to eat soup. Assisted w/ cleaning up, moving tray closer to pt, and verbal cues to use spoon w/ improved ability to self-feed. Verbal cues to attend to L side of tray, to initiate and terminate tasks, and for safety w/ taking smaller bites. Pt making very slow, but purposeful movements. Pt continued to offer minimal engagement w/ this therapist. Eventually able to engage pt on conversation about why she appears lethargic and with a more depressed mood. Pt states she misses her boyfriend - [place]. Instructed pt on how to use room phone to call boyfriend, this lifted  her mood some. Provided w/ written instructions to follow in case she wanted to call again. Mod-max assist to change shirt as it had been soiled from lunch. Pt requesting to return to supine, max assist squat pivot towards L side. Ended session in supine, all needs in reach.  Therapy Documentation Precautions:  Precautions Precautions: Fall Restrictions Weight Bearing Restrictions: No General: PT Amount of Missed Time (min): 15 Minutes PT Missed Treatment Reason: Patient unwilling to participate Vital Signs: Therapy Vitals BP: (!) 155/70 Patient Position (if appropriate): Lying Pain: Pain Assessment Pain Scale: 0-10 Pain Score: 7  Pain Type: Acute pain Pain Location: Head Pain Orientation: Anterior;Mid Pain Descriptors / Indicators: Headache Pain Frequency: Intermittent Pain Onset: On-going Pain Intervention(s): Medication (See eMAR)  Therapy/Group: Individual Therapy  Artia Singley Melton Krebs 02/12/2019, 8:17 AM

## 2019-02-12 NOTE — Patient Care Conference (Signed)
Inpatient RehabilitationTeam Conference and Plan of Care Update Date: 02/11/2019   Time: 11:50 AM    Patient Name: [name]      Medical Record Number: [id number]  Date of Birth: 10/19/1956 Sex: Female         Room/Bed: 4M10C/4M10C-01 Payor Info: Payor: [employer] MEDICARE / Plan: [employer] MEDICARE / Product Type: *No Product type* /    Admitting Diagnosis: CVA  Admit Date/Time:  02/06/2019  5:35 PM Admission Comments: No comment available   Primary Diagnosis:  <principal problem not specified> Principal Problem: <principal problem not specified>  Patient Active Problem List   Diagnosis Date Noted  . Vascular headache   . Hypertensive crisis   . Thrombocytopenia (HCC)   . Leukopenia   . Hypoalbuminemia due to protein-calorie malnutrition (HCC)   . Hypokalemia   . Prediabetes   . Spastic hemiplegia affecting nondominant side (HCC)   . Chronic diastolic congestive heart failure (HCC)   . Substance abuse (HCC)   . Essential hypertension   . Labile blood pressure   . Aneurysm, cerebral, nonruptured 02/06/2019  . Acute right MCA stroke (HCC) 02/06/2019  . 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 Right carotid artery occlusion 02/05/2019  . Left hemiparesis (HCC) 02/05/2019  . CVA (cerebral vascular accident) (HCC) 02/03/2019    Expected Discharge Date: Expected Discharge Date: 02/27/19  Team Members Present: Physician leading conference: Dr. Maryla Morrow Social Worker Present: Amada Jupiter, LCSW Nurse Present: Willey Blade, RN PT Present: Carlynn Purl, PT OT Present: Blanch Media, OT SLP Present: Feliberto Gottron, SLP PPS Coordinator present : Edson Snowball, PT     Current Status/Progress Goal Weekly Team Focus  Medical   Left side weaknesssecondary to multiple right brain infarcts adjacent to previous right MCA infarcts on 02/04/2019 in the setting of 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   Improve mobility,  safety, swallowing, HTN, CHF, spasticity, hypokalemia, leukopenia, h/A  See above   Bowel/Bladder   inc x 2 last BM 03/30  timed toileting   mod assist with hourly rounding and timed toileting   Swallow/Nutrition/ Hydration             ADL's   Max A overall- Stedy used for transfers  Min A overall  Transfers, self-care retraining, pt education, attention, initiation, L NMR, sit<>stands   Mobility   max assist overall, stedy or squat pivot transfers, gait 5' at rail  min assist  postural control/midline orientation, LLE NMR, R attention   Communication   Min A  Supervision  use of speech intelligibility strategies    Safety/Cognition/ Behavioral Observations  Mod A   Supervision  problem solving, attention to right    Pain   pt has h/a 8/10 pain   to monitor pain and offer prn pain medication with a goal of 2 out 10  to continue to monitor   Skin   intact  to remain intact  continue to monitor on a daily basis and PRN    Rehab Goals Patient on target to meet rehab goals: Yes *See Care Plan and progress notes for long and short-term goals.     Barriers to Discharge  Current Status/Progress Possible Resolutions Date Resolved   Physician    Medical stability     See above  Therapies, optimize BP meds, follow labs, spasticity meds      Nursing  PT                    OT                  SLP                SW                Discharge Planning/Teaching Needs:  Plan to return home with boyfriend and his two sisters PLUS a CAP aide who can, all together, provide 24/7 supervision/ assist.  Teaching to be planned closer to d/c.   Team Discussion:  Started BP meds yesterday;  CHF - need consistent and accurate weights.  C/o HA - restarting topomax;  incont of b/b.  Max assist overall with PT/ OT and max to amb ~ 2' with railing.  Min assist goals.  Dysarthric and poor initiation overall.  Revisions to Treatment Plan:  NA    Continued Need for Acute  Rehabilitation Level of Care: The patient requires daily medical management by a physician with specialized training in physical medicine and rehabilitation for the following conditions: Daily direction of a multidisciplinary physical rehabilitation program to ensure safe treatment while eliciting the highest outcome that is of practical value to the patient.: Yes Daily medical management of patient stability for increased activity during participation in an intensive rehabilitation regime.: Yes Daily analysis of laboratory values and/or radiology reports with any subsequent need for medication adjustment of medical intervention for : Blood pressure problems;Other;Neurological problems   I attest that I was present, lead the team conference, and concur with the assessment and plan of the team.   Amada Jupiter 02/13/2019, 9:16 AM   Team conference was held via web/ teleconference due to COVID - 19

## 2019-02-12 NOTE — Progress Notes (Signed)
Physical Therapy Session Note  Patient Details  Name: [name] MRN: [id number] Date of Birth: [birth year]  Today's Date: 02/12/2019 PT Individual Time: 1520-1548 PT Individual Time Calculation (min): 28 min   Short Term Goals: Week 1:  PT Short Term Goal 1 (Week 1): Pt will perform bed mobility with mod assist  PT Short Term Goal 2 (Week 1): Pt will transfer to [facility] with mod assist  PT Short Term Goal 3 (Week 1): Pt will initate Gait training  PT Short Term Goal 4 (Week 1): Pt will propell WC 13ft with min assist   Skilled Therapeutic Interventions/Progress Updates:    Patient in supine with sheet over her head.  Willing to work with PT to make up time.  Performed scooting to L with mod A for L LE; rolling to R with max A and cues, side to sit mod A and cues.  Patient scooting with A for L hip activation.  Sit > stand to hemiwalker with mod to max A for L foot placement, increased time to achieve upright posture with cues and facilitation.  Standing initially with cues to look out window forward and to L for increased posture and L attention.  Standing weight shifts and stepping forward and back with R then L LE max A for L LE management and posture, weight shift/etc.  Seated EOB with S and eating chips, drinking water with mod cues for managing crumbs sticking out on L side of her mouth.  Patient attempting to scoot up to [facility] seated at EOB.  Sit to stand to [facility] and side steps to R with max verbal and visual cues, assist for weight shift, moving L foot and for balance overall max A.  Sit to supine with mod a for L LE.  Left in supine with bed alarm on and call bell/table in reach.   Therapy Documentation Precautions:  Precautions Precautions: Fall Restrictions Weight Bearing Restrictions: No Pain: Pain Assessment Faces Pain Scale: No hurt     Therapy/Group: Individual Therapy  Elray Mcgregor  [place], PT 02/12/2019, 5:20 PM

## 2019-02-12 NOTE — Progress Notes (Signed)
Social Work Patient ID: [name], female   DOB: 01/29/1956, 63 y.o.   MRN: [id number]   Have reviewed team conference with pt and s/o, [name].  Pt with limited engagement.  Pleasant and simply states "OK" with presented info.  Romeo Apple is aware and agreeable with targeted d/c date of 4/17 and min assist goals overall.  Will continue to follow.  TRUE Shackleford, LCSW

## 2019-02-13 ENCOUNTER — Inpatient Hospital Stay (HOSPITAL_COMMUNITY): Payer: Medicare Other | Admitting: Occupational Therapy

## 2019-02-13 ENCOUNTER — Inpatient Hospital Stay (HOSPITAL_COMMUNITY): Payer: Medicare Other | Admitting: Speech Pathology

## 2019-02-13 ENCOUNTER — Inpatient Hospital Stay (HOSPITAL_COMMUNITY): Payer: Medicare Other | Admitting: Physical Therapy

## 2019-02-13 DIAGNOSIS — G8110 Spastic hemiplegia affecting unspecified side: Secondary | ICD-10-CM

## 2019-02-13 MED ORDER — TIZANIDINE HCL 2 MG PO TABS
2.0000 mg | ORAL_TABLET | Freq: Two times a day (BID) | ORAL | Status: DC
  Administered 2019-02-13 – 2019-02-15 (×5): 2 mg via ORAL
  Filled 2019-02-13 (×5): qty 1

## 2019-02-13 NOTE — Progress Notes (Signed)
Speech Language Pathology Daily Session Note  Patient Details  Name: [name] MRN: [id number] Date of Birth: 07-06-1956  Today's Date: 02/13/2019 SLP Individual Time: 0725-0820 SLP Individual Time Calculation (min): 55 min  Short Term Goals: Week 2: SLP Short Term Goal 1 (Week 2): Patient will utilize speech intelligibility strategies at the sentence level with achieve ~90% intelligibility with Min A verbal cues.  SLP Short Term Goal 2 (Week 2): Patient will demonstrate sustained attention to tasks for ~15 minutes with Mod A verbal cues.  SLP Short Term Goal 3 (Week 2): Patient will demonstrate functional problem solving for basic and familiar tasks with Min A verbal cues.  SLP Short Term Goal 4 (Week 2): Patient will attend to left field of enviornment during functional tasks with Min A verbal cues.  SLP Short Term Goal 5 (Week 2): Patient will respond to questions, etc in 75% of opportunities with Max A verbal cues.   Skilled Therapeutic Interventions: Skilled treatment session focused on cognitive goals. Upon arrival, patient was awake and was more responsive compared to previous SLP sessions. SLP facilitated session by providing Mod A verbal cues for problem solving, initiation and attention to left field of environment during tray set-up and self-feeding. Patient with increased verbal responses this session but continued to require more than a reasonable amount of time and multiple repetitions. Max-Total A multimodal cues were also needed to scan and locate information during functional use of her daily schedule. Patient left upright in bed with alarm on and all needs within reach. Continue with current plan of care.      Pain No/Denies Pain   Therapy/Group: Individual Therapy  Cally Nygard 02/13/2019, 10:40 AM

## 2019-02-13 NOTE — Progress Notes (Addendum)
[facility] PHYSICAL MEDICINE & REHABILITATION PROGRESS NOTE   Subjective/Complaints: Patient seen sitting up in bed working with SLP this morning.  She states she slept well overnight.  She is slightly more engaged this morning.  Discussed with SLP who states improvement after the seeing baclofen.  Discussed with nursing as well.  It does not appear that she wore her orthoses overnight.  ROS: Denies CP, shortness of breath, nausea, vomiting, diarrhea.  Objective:   Ct Head Wo Contrast  Result Date: 02/11/2019 CLINICAL DATA:  63 year old female EXAM: CT HEAD WITHOUT CONTRAST TECHNIQUE: Contiguous axial images were obtained from the base of the skull through the vertex without intravenous contrast. COMPARISON:  CT imaging 02/03/2019, MR imaging 02/03/2019, with additional prior CT 10/26/2011 FINDINGS: Brain: Redemonstration of encephalomalacia of the majority of the right hemisphere with ex vacuo dilation of the right lateral ventricle and cortical thinning. Evolution of acute/subacute infarction of the paramedian right posterior frontal and parietal lobe as well as within the right corona radiata extending towards posterior limb. No acute intracranial hemorrhage.  No midline shift. Confluent hypodensity in the periventricular white matter. Vascular: No significant intracranial atherosclerosis. Skull: No acute displaced fracture. Sinuses/Orbits: Unremarkable Other: None IMPRESSION: No acute intracranial abnormality. Evolution of the evolving infarction of right hemisphere, as above. Similar appearance of chronic changes of remote right MCA infarction and expansion of the right lateral ventricle, with background changes of chronic microvascular ischemic disease. Electronically Signed   By: Gilmer Mor D.O.   On: 02/11/2019 14:37   Recent Labs    02/12/19 0435  WBC 4.0  HGB 12.7  HCT 38.5  PLT 173   Recent Labs    02/12/19 0435  NA 132*  K 3.8  CL 102  CO2 23  GLUCOSE 113*  BUN 13   CREATININE 0.81  CALCIUM 8.5*    Intake/Output Summary (Last 24 hours) at 02/13/2019 0925 Last data filed at 02/12/2019 1850 Gross per 24 hour  Intake 240 ml  Output -  Net 240 ml     Physical Exam: Vital Signs Blood pressure (!) 145/95, pulse 73, temperature 99 F (37.2 C), temperature source Oral, resp. rate 15, weight 62.9 kg, SpO2 95 %. Constitutional: No distress . Vital signs reviewed. HENT: Normocephalic.  Atraumatic. Eyes: EOMI. No discharge. Cardiovascular: RRR.  No JVD. Respiratory: CTA bilaterally.  Normal effort. GI: BS +. Non-distended. Musc: No edema or tenderness in extremities. Neurologic: Alert and oriented, except for date of month Dysarthria, stable Motor:  0/5 LUE 2/5 hip flexion, distally 0/5 LLE Increased tone LUE/LLE, unchanged Skin: Warm and dry.  Intact. Psych: Slowed  Assessment/Plan: 1. Functional deficits secondary to RIght Basal ganglia infarct which require 3+ hours per day of interdisciplinary therapy in a comprehensive inpatient rehab setting.  Physiatrist is providing close team supervision and 24 hour management of active medical problems listed below.  Physiatrist and rehab team continue to assess barriers to discharge/monitor patient progress toward functional and medical goals  Care Tool:  Bathing    Body parts bathed by patient: Chest, Front perineal area, Abdomen, Buttocks, Right upper leg, Left upper leg   Body parts bathed by helper: Right arm, Left arm, Right lower leg, Left lower leg, Face     Bathing assist Assist Level: Maximal Assistance - Patient 24 - 49%     Upper Body Dressing/Undressing Upper body dressing Upper body dressing/undressing activity did not occur (including orthotics): Refused What is the patient wearing?: Hospital gown only    Upper body  assist Assist Level: Maximal Assistance - Patient 25 - 49%    Lower Body Dressing/Undressing Lower body dressing      What is the patient wearing?: Incontinence  brief     Lower body assist Assist for lower body dressing: Total Assistance - Patient < 25%     Toileting Toileting    Toileting assist Assist for toileting: Dependent - Patient 0%     Transfers Chair/bed transfer  Transfers assist     Chair/bed transfer assist level: Maximal Assistance - Patient 25 - 49%     Locomotion Ambulation   Ambulation assist   Ambulation activity did not occur: Safety/medical concerns  Assist level: Maximal Assistance - Patient 25 - 49% Assistive device: Walker-hemi Max distance: 2'   Walk 10 feet activity   Assist  Walk 10 feet activity did not occur: Safety/medical concerns        Walk 50 feet activity   Assist Walk 50 feet with 2 turns activity did not occur: Safety/medical concerns         Walk 150 feet activity   Assist Walk 150 feet activity did not occur: Safety/medical concerns         Walk 10 feet on uneven surface  activity   Assist Walk 10 feet on uneven surfaces activity did not occur: Safety/medical concerns         Wheelchair     Assist Will patient use wheelchair at discharge?: Yes Type of Wheelchair: Manual Wheelchair activity did not occur: Refused  Wheelchair assist level: Maximal Assistance - Patient 25 - 49% Max wheelchair distance: 56'    Wheelchair 50 feet with 2 turns activity    Assist    Wheelchair 50 feet with 2 turns activity did not occur: Refused   Assist Level: Maximal Assistance - Patient 25 - 49%   Wheelchair 150 feet activity     Assist Wheelchair 150 feet activity did not occur: Safety/medical concerns        Medical Problem List and Plan: 1.Left side weaknesssecondary to multiple right brain infarcts adjacent to previous right MCA infarcts on 02/04/2019 in the setting of 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 Follow-up outpatient interventional radiology for ICA aneurysm  Continue CIR  WHO, PRAFO  ordered for left side for spasticity/contracture mgt  Repeat head CT 4/1 reviewed, showing evolving infarct as well as chronic infarct. 2. Antithrombotics: -DVT/anticoagulation:Subcutaneous Lovenox -antiplatelet therapy: Aspirin 325 mg daily 3. Pain Management:Lyrica 75 mg daily, Tylenol as needed  Topamax started on 3/31, DC'd on 4/2 due to Lethargy 4. Mood/bipolar disorder -antipsychotic agents: Risperdal 1 mg daily at bedtime 5. Neuropsych: This patientiscapable of making decisions on herown behalf. 6. Skin/Wound Care:Routine skin checks 7. Fluids/Electrolytes/Nutrition:Routine in and out's  8. Hypertension. Monitor with increased mobility. Patient on Norvasc 5 mg daily, Coreg 12.5 mg twice a day prior to admission. Resume as needed Vitals:   02/12/19 1408 02/13/19 0528  BP: (!) 147/78 (!) 145/95  Pulse: 67 73  Resp: 18 15  Temp: 98 F (36.7 C) 99 F (37.2 C)  SpO2: 100% 95%   Norvasc 2.5 started on 3/31, increased to 5 on 4/2  Elevated on 4/3, however improving 9. Diastolic congestive heart failure. Monitor for any signs of fluid overload Filed Weights   02/10/19 0753 02/11/19 0352 02/13/19 0738  Weight: 62.4 kg 64.4 kg 62.9 kg   Stable on 4/3 10. History oftobacco/cocaine use. Provide counseling 11. Hyperlipidemia. Lipitor 12.  Constipation: Improving 13.  Spasticity hemiparesis  Tizanidine 2 mg twice daily started on 4/3  Baclofen 5 3 times daily started on 3/31, DC'd on 4/2 due to Lethargy 14.  Prediabetes  Borderline, will consider changing to carb modified diet if necessary 15.  Hypokalemia  Potassium 3.8 on 4/2 after supplementation  Labs ordered for Monday 16.  Hypoalbuminemia  Supplement initiated on 3/30 17.  Leukopenia: Resolved  WBCs 4.0 on 4/2  Continue to monitor 18.  Thrombocytopenia: Resolved  Platelets 173 on 4/2  Continue to monitor 19.  Hyponatremia  Sodium 132 on 4/2  Labs ordered for Monday  Continue to  monitor  LOS: 7 days A FACE TO FACE EVALUATION WAS PERFORMED  Daquann Merriott Karis Juba 02/13/2019, 9:25 AM

## 2019-02-13 NOTE — Progress Notes (Signed)
Occupational Therapy Weekly Progress Note  Patient Details  Name: [name] MRN: [id number] Date of Birth: Aug 26, 1956  Beginning of progress report period: February 07, 2019 End of progress report period: February 13, 2019  Today's Date: 02/13/2019 OT Individual Time: 1245-1400 OT Individual Time Calculation (min): 75 min   Patient has met 1 of 3 short term goals.  Pt is making slow progress towards OT goals at this time. Pt has been very limited by lethargy and unable to participate in therapy at times. When patient is alert and participating, she can transfer with the [facility] with as little as mod A to stand. She continues to need max/total A for BADL tasks. Will continue working towards [employer].  Patient continues to demonstrate the following deficits: muscle weakness and muscle joint tightness, decreased activity tolerance, abnormal tone, unbalanced muscle activation, decreased coordination and decreased motor planning, decreased attention to left, decreased initiation, decreased attention, decreased awareness, decreased problem solving, decreased safety awareness, decreased memory and delayed processing and decreased sitting balance, decreased standing balance, decreased postural control, hemiplegia and decreased balance strategies and therefore will continue to benefit from skilled OT intervention to enhance overall performance with BADL and Reduce care partner burden.  Patient progressing toward long term goals..  Continue plan of care.  OT Short Term Goals Week 1:  OT Short Term Goal 1 (Week 1): Pt will perform toilet transfer with max A in order to decrease level of assist with functional transfers. OT Short Term Goal 1 - Progress (Week 1): Not met OT Short Term Goal 2 (Week 1): Pt will maintain dynamic balance during functional tasks seated EOB with min A overall. OT Short Term Goal 2 - Progress (Week 1): Met OT Short Term Goal 3 (Week 1): Pt will perform UB dressing with mod A in  order to decrease level of assist with self care. OT Short Term Goal 3 - Progress (Week 1): Not met Week 2:  OT Short Term Goal 1 (Week 2): Pt will perform UB dressing with mod A in order to decrease level of assist with self care. OT Short Term Goal 2 (Week 2): Pt will perform toilet transfer with max A in order to decrease level of assist with functional transfers. OT Short Term Goal 3 (Week 2): Pt will complete sit<>stand with max A of 1 person without Stedy in preparation for BADL tasks  Skilled Therapeutic Interventions/Progress Updates:    Pt greeted semi-reclined in bed asleep, difficult to wake, and initially refusing to participate in OT. OT dependently transferred pt to sitting EOB and pt became more alert and agreeable to shower. Pt completed sit<>stand in [place] with mod A. Stedy used to transfer pt onto wide BSC placed in shower as shower seat. Pt declined stopping at the commode first stating she did not need to use the bathroom. Once shower started, pt reported need to urinate and voided in the shower. Pt needed assistance to wash LEs, but was able to reach between legs to wash peri-are with CGA for balance as she was leaning to the L. Dressing completed with pt able to thread R LE into pant legs today and also was able to thread R UE through shirt sleeve. Sit<>stand completed at the sink without Stedy with pt needing max A to achieve stand. Facilitation for full hip/trunk extension, then OT assist to pull up pants. Pt then ate lunch seated in wc with verbal cues for bite size and to check for food pocketing. Worked  on visual scanning within self feeding task to locate food items on L side of plate. Pt left seated in wc with alarm belt on, call bell in reach, and nurse tech changing linens.   Therapy Documentation Precautions:  Precautions Precautions: Fall Restrictions Weight Bearing Restrictions: No Pain:   denies pain  Therapy/Group: Individual Therapy  Valma Cava 02/13/2019,  2:53 PM

## 2019-02-13 NOTE — Progress Notes (Addendum)
Physical Therapy Session Note  Patient Details  Name: NONI PULLEY MRN: [id number] Date of Birth: 09-07-1956  Today's Date: 02/13/2019 PT Individual Time: 0900-0910 AND [id number] PT Individual Time Calculation (min): 10 min AND 51 min  Short Term Goals: Week 1:  PT Short Term Goal 1 (Week 1): Pt will perform bed mobility with mod assist  PT Short Term Goal 2 (Week 1): Pt will transfer to [facility] with mod assist  PT Short Term Goal 3 (Week 1): Pt will initate Gait training  PT Short Term Goal 4 (Week 1): Pt will propell WC 122ft with min assist   Skilled Therapeutic Interventions/Progress Updates:   Session 1: Pt asleep in supine and easily woken up. Therapist attempted to engage pt w/ conversation and encouragement to participate in therapy. Pt did not respond to therapist. Pt made eye contact and made purposeful movements w/ coughing and adjusting covers in bed. Educated pt on importance of participation, however continued to ignore therapist. Will reattempt session later this date.  Session 2: Pt asleep in supine and easily woken up, no c/o pain. Pt continued to ignore therapist. Removed covers, opened blinds, and provided total assist to don pants in supine. Pt appeared more agreeable to activity, but continued to engage minimally. Total assist transfer to EOB and stedy transfer to w/c. Max assist sit<>stand in stedy. Total assist w/c transport to/from therapy gym for time management. Worked on [employer] and gait at rail in hallway. Sit<>stands to rail, max assist fading to mod assist w/ repetition. Manual and tactile cues for anterior weight shifting upon standing and then for upright posture once in stance. Pre-gait w/ R forward and backward stepping to work on L single leg stance and L quad activation. Performed 4-5 reps of standing before initiating gait. Ambulated 15' w/ w/c follow, max assist for LLE management w/ step placement, knee extension and for L hip extension to neutral in stance.  Returned to room and total assist squat pivot transfer to EOB. Max-total assist to reposition in bed for safety. Ended session in supine, all needs in reach.   Missed 14 min of skilled PT in total this session 2/2 fatigue/refusal.    Therapy Documentation Precautions:  Precautions Precautions: Fall Restrictions Weight Bearing Restrictions: No  Therapy/Group: Individual Therapy  Keiasha Diep Melton Krebs 02/13/2019, 9:16 AM

## 2019-02-14 ENCOUNTER — Inpatient Hospital Stay (HOSPITAL_COMMUNITY): Payer: Medicare Other | Admitting: Occupational Therapy

## 2019-02-14 ENCOUNTER — Inpatient Hospital Stay (HOSPITAL_COMMUNITY): Payer: Medicare Other | Admitting: Physical Therapy

## 2019-02-14 NOTE — Plan of Care (Signed)
  Problem: Consults Goal: RH GENERAL PATIENT EDUCATION Description See Patient Education module for education specifics. Outcome: Progressing   Problem: RH BOWEL ELIMINATION Goal: RH STG MANAGE BOWEL WITH ASSISTANCE Description STG Manage Bowel with Assistance. Outcome: Progressing   Problem: RH BLADDER ELIMINATION Goal: RH STG MANAGE BLADDER WITH ASSISTANCE Description STG Manage Bladder With Assistance Outcome: Progressing   Problem: RH SKIN INTEGRITY Goal: RH STG SKIN FREE OF INFECTION/BREAKDOWN Outcome: Progressing   Problem: RH SAFETY Goal: RH STG ADHERE TO SAFETY PRECAUTIONS W/ASSISTANCE/DEVICE Description STG Adhere to Safety Precautions With Assistance/Device. Outcome: Progressing   Problem: RH PAIN MANAGEMENT Goal: RH STG PAIN MANAGED AT OR BELOW PT'S PAIN GOAL Outcome: Progressing   Problem: RH KNOWLEDGE DEFICIT GENERAL Goal: RH STG INCREASE KNOWLEDGE OF SELF CARE AFTER HOSPITALIZATION Outcome: Progressing   Problem: Consults Goal: RH STROKE PATIENT EDUCATION Description See Patient Education module for education specifics  Outcome: Progressing   

## 2019-02-14 NOTE — Progress Notes (Signed)
[facility] PHYSICAL MEDICINE & REHABILITATION PROGRESS NOTE   Subjective/Complaints: Patient seen laying in bed this morning.  She states she slept well overnight.  She remains flat but engages slightly more than yesterday.  ROS: Denies CP, shortness of breath, nausea, vomiting, diarrhea.  Objective:   No results found. Recent Labs    02/12/19 0435  WBC 4.0  HGB 12.7  HCT 38.5  PLT 173   Recent Labs    02/12/19 0435  NA 132*  K 3.8  CL 102  CO2 23  GLUCOSE 113*  BUN 13  CREATININE 0.81  CALCIUM 8.5*    Intake/Output Summary (Last 24 hours) at 02/14/2019 1318 Last data filed at 02/14/2019 0959 Gross per 24 hour  Intake 120 ml  Output -  Net 120 ml     Physical Exam: Vital Signs Blood pressure (!) 147/95, pulse 79, temperature 99.3 F (37.4 C), temperature source Oral, resp. rate 18, weight 64.5 kg, SpO2 98 %. Constitutional: No distress . Vital signs reviewed. HENT: Normocephalic.  Atraumatic. Eyes: EOMI. No discharge. Cardiovascular: RRR.  No JVD. Respiratory: CTA bilaterally.  Normal effort. GI: BS +. Non-distended. Musc: No edema or tenderness in extremities. Neurologic: Alert and oriented, except for date of month Dysarthria, unchanged Motor:  0/5 LUE, unchanged 2/5 hip flexion, distally 0/5 LLE, unchanged Increased tone LUE/LLE, unchanged Skin: Warm and dry.  Intact. Psych: Slowed  Assessment/Plan: 1. Functional deficits secondary to RIght Basal ganglia infarct which require 3+ hours per day of interdisciplinary therapy in a comprehensive inpatient rehab setting.  Physiatrist is providing close team supervision and 24 hour management of active medical problems listed below.  Physiatrist and rehab team continue to assess barriers to discharge/monitor patient progress toward functional and medical goals  Care Tool:  Bathing    Body parts bathed by patient: Chest, Front perineal area, Abdomen, Buttocks, Right upper leg, Left upper leg   Body parts  bathed by helper: Right arm, Left arm, Right lower leg, Left lower leg, Face     Bathing assist Assist Level: Maximal Assistance - Patient 24 - 49%     Upper Body Dressing/Undressing Upper body dressing Upper body dressing/undressing activity did not occur (including orthotics): Refused What is the patient wearing?: Hospital gown only    Upper body assist Assist Level: Maximal Assistance - Patient 25 - 49%    Lower Body Dressing/Undressing Lower body dressing      What is the patient wearing?: Incontinence brief     Lower body assist Assist for lower body dressing: Total Assistance - Patient < 25%     Toileting Toileting    Toileting assist Assist for toileting: Dependent - Patient 0%     Transfers Chair/bed transfer  Transfers assist     Chair/bed transfer assist level: Total Assistance - Patient < 25%     Locomotion Ambulation   Ambulation assist   Ambulation activity did not occur: Safety/medical concerns  Assist level: 2 helpers Assistive device: Other (comment)(rail in hallway) Max distance: 15'   Walk 10 feet activity   Assist  Walk 10 feet activity did not occur: Safety/medical concerns  Assist level: 2 helpers Assistive device: Other (comment)(rail in hallway)   Walk 50 feet activity   Assist Walk 50 feet with 2 turns activity did not occur: Safety/medical concerns         Walk 150 feet activity   Assist Walk 150 feet activity did not occur: Safety/medical concerns         Walk  10 feet on uneven surface  activity   Assist Walk 10 feet on uneven surfaces activity did not occur: Safety/medical concerns         Wheelchair     Assist Will patient use wheelchair at discharge?: Yes Type of Wheelchair: Manual Wheelchair activity did not occur: Refused  Wheelchair assist level: Maximal Assistance - Patient 25 - 49% Max wheelchair distance: 71'    Wheelchair 50 feet with 2 turns activity    Assist    Wheelchair  50 feet with 2 turns activity did not occur: Refused   Assist Level: Maximal Assistance - Patient 25 - 49%   Wheelchair 150 feet activity     Assist Wheelchair 150 feet activity did not occur: Safety/medical concerns        Medical Problem List and Plan: 1.Left side weaknesssecondary to multiple right brain infarcts adjacent to previous right MCA infarcts on 02/04/2019 in the setting of 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 Follow-up outpatient interventional radiology for ICA aneurysm  Continue CIR  WHO, PRAFO ordered for left side for spasticity/contracture mgt  Repeat head CT 4/1 reviewed, showing evolving infarct as well as chronic infarct. 2. Antithrombotics: -DVT/anticoagulation:Subcutaneous Lovenox -antiplatelet therapy: Aspirin 325 mg daily 3. Pain Management:Lyrica 75 mg daily, Tylenol as needed  Topamax started on 3/31, DC'd on 4/2 due to Lethargy 4. Mood/bipolar disorder -antipsychotic agents: Risperdal 1 mg daily at bedtime 5. Neuropsych: This patientiscapable of making decisions on herown behalf. 6. Skin/Wound Care:Routine skin checks 7. Fluids/Electrolytes/Nutrition:Routine in and out's  8. Hypertension. Monitor with increased mobility. Patient on Norvasc 5 mg daily, Coreg 12.5 mg twice a day prior to admission. Resume as needed Vitals:   02/13/19 1947 02/14/19 0658  BP: (!) 153/99 (!) 147/95  Pulse: 94 79  Resp: 20 18  Temp: 99.7 F (37.6 C) 99.3 F (37.4 C)  SpO2: 98% 98%   Norvasc 2.5 started on 3/31, increased to 5 on 4/2  Remains elevated on 4/4, will consider further increase tomorrow if persistently elevated 9. Diastolic congestive heart failure. Monitor for any signs of fluid overload Filed Weights   02/13/19 0738 02/14/19 0500 02/14/19 0658  Weight: 62.9 kg 61.9 kg 64.5 kg   ?  Reliability on 4/4 10. History oftobacco/cocaine use. Provide counseling 11.  Hyperlipidemia. Lipitor 12.  Constipation: Improving 13.  Spasticity hemiparesis  Tizanidine 2 mg twice daily started on 4/3, will consider further increase tomorrow  Baclofen 5 3 times daily started on 3/31, DC'd on 4/2 due to Lethargy 14.  Prediabetes  Borderline, will consider changing to carb modified diet if necessary 15.  Hypokalemia  Potassium 3.8 on 4/2 after supplementation  Labs ordered for Monday 16.  Hypoalbuminemia  Supplement initiated on 3/30 17.  Leukopenia: Resolved  WBCs 4.0 on 4/2  Continue to monitor 18.  Thrombocytopenia: Resolved  Platelets 173 on 4/2  Continue to monitor 19.  Hyponatremia  Sodium 132 on 4/2  Labs ordered for Monday  Continue to monitor  LOS: 8 days A FACE TO FACE EVALUATION WAS PERFORMED  Avett Reineck Karis Juba 02/14/2019, 1:18 PM

## 2019-02-14 NOTE — Progress Notes (Signed)
Physical Therapy Weekly Progress Note  Patient Details  Name: [name] MRN: [id number] Date of Birth: 1956/03/05  Beginning of progress report period: February 07, 2019 End of progress report period: February 14, 2019  Today's Date: 02/14/2019 PT Individual Time: 1225-1249 PT Individual Time Calculation (min): 24 min   Patient has met 1 of 4 short term goals. Pt has made slow progress towards LTGs over last week 2/2 poor participation, intermittent bouts of lethargy, evolution of acute stroke, and acute stroke in setting on chronic stroke. She continues to require max-total assist x1 for all mobility and often demonstrates no initiation or internal motivation to perform tasks, suspect a large component is behavioral.  Patient continues to demonstrate the following deficits muscle weakness and muscle joint tightness, decreased cardiorespiratoy endurance, abnormal tone, unbalanced muscle activation, decreased coordination and decreased motor planning, decreased attention to left, decreased initiation, decreased attention, decreased awareness, decreased problem solving, decreased safety awareness, decreased memory and delayed processing and decreased sitting balance, decreased standing balance, decreased postural control, hemiplegia and decreased balance strategies and therefore will continue to benefit from skilled PT intervention to increase functional independence with mobility.  Patient not progressing toward long term goals.  See goal revision..  Plan of care revisions: gait LTG downgraded to mod assist .  PT Short Term Goals Week 1:  PT Short Term Goal 1 (Week 1): Pt will perform bed mobility with mod assist  PT Short Term Goal 1 - Progress (Week 1): Progressing toward goal PT Short Term Goal 2 (Week 1): Pt will transfer to [facility] with mod assist  PT Short Term Goal 2 - Progress (Week 1): Progressing toward goal PT Short Term Goal 3 (Week 1): Pt will initate Gait training  PT Short Term Goal 3 -  Progress (Week 1): Met PT Short Term Goal 4 (Week 1): Pt will propell WC 19f with min assist  PT Short Term Goal 4 - Progress (Week 1): Progressing toward goal Week 2:  PT Short Term Goal 1 (Week 2): Pt will transfer bed<>chair w/ mod assist consistently PT Short Term Goal 2 (Week 2): Pt will ambulate 30' w/ LRAD, max assist PT Short Term Goal 3 (Week 2): Pt will self-propel w/c 50' w/ min assist PT Short Term Goal 4 (Week 2): Pt will attend to R environment during functional mobility w/ min cues 100% of the time   Skilled Therapeutic Interventions/Progress Updates:   Pt in w/c and agreeable to therapy, no c/o pain. Total assist w/c transport to/from therapy gym. Sit<>stands at rail w/ pre-gait NMR while performing R forward and backward stepping for R quad activation, lateral weight shifting, and attempts at advancing LLE w/ shoe cap on. Max verbal and tactile cues for pt to meaningfully engage in tasks. Min assist sit<>stands and min assist to maintain standing but unable to reach full upright despite max manual/tactile cues at glut musculature and upper chest. Returned to room via w/c, max-total assist squat pivot and to scoot towards head of bed, max assist sit>supine. Ended session in supine, all needs in reach.  Therapy Documentation Precautions:  Precautions Precautions: Fall Restrictions Weight Bearing Restrictions: No Vital Signs:   Therapy/Group: Individual Therapy  Naresh Althaus K Reina Wilton 02/14/2019, 1:00 PM

## 2019-02-14 NOTE — Progress Notes (Signed)
Occupational Therapy Session Note  Patient Details  Name: [name] MRN: [id number] Date of Birth: 10-11-1956  Today's Date: 02/14/2019 OT Individual Time: 0930-1030 OT Individual Time Calculation (min): 60 min   Short Term Goals: Week 2:  OT Short Term Goal 1 (Week 2): Pt will perform UB dressing with mod A in order to decrease level of assist with self care. OT Short Term Goal 2 (Week 2): Pt will perform toilet transfer with max A in order to decrease level of assist with functional transfers. OT Short Term Goal 3 (Week 2): Pt will complete sit<>stand with max A of 1 person without Stedy in preparation for BADL tasks  Skilled Therapeutic Interventions/Progress Updates:    Pt greeted semi-reclined in bed, alert, and agreeable to OT treatment session. Pt much more like herself today. Pt came to sitting EOB with assistance to advance L LE and elevate trunk. Stedy used to transfer pt to the sink. Bathing completed from the stedy at the sink with focus on midline orientation and sitting/standing balance. Pt needed cues throughout session to bring body to midline 2/2 lateral lean to the L with some pusher tendencies. Pt noted to be incontinent of BM and required max A to peri-care and brief change. Pt was able to maintain standing at the [facility] with Max A while reaching to wash between her legs.  Worked on dressing with OT assisting with affected side, but pt still needing mod/max A for dressing. Woked on sit<>stand without stedy with max A and facilitation for full hip/trunk extension. Pt left seated in wc at end of session with alarm belt on, call bell in reach, and needs met.   Therapy Documentation Precautions:  Precautions Precautions: Fall Restrictions Weight Bearing Restrictions: No Pain:   none/denies pain  Therapy/Group: Individual Therapy  Valma Cava 02/14/2019, 12:32 PM

## 2019-02-15 ENCOUNTER — Inpatient Hospital Stay (HOSPITAL_COMMUNITY): Payer: Medicare Other

## 2019-02-15 DIAGNOSIS — E871 Hypo-osmolality and hyponatremia: Secondary | ICD-10-CM

## 2019-02-15 MED ORDER — TIZANIDINE HCL 2 MG PO TABS
4.0000 mg | ORAL_TABLET | Freq: Two times a day (BID) | ORAL | Status: DC
  Administered 2019-02-15 – 2019-02-17 (×4): 4 mg via ORAL
  Filled 2019-02-15 (×4): qty 2

## 2019-02-15 NOTE — Progress Notes (Signed)
Physical Therapy Session Note  Patient Details  Name: [name] MRN: [id number] Date of Birth: Nov 19, 1955  Today's Date: 02/15/2019 PT Individual Time: 1115-1155 PT Individual Time Calculation (min): 40 min   Short Term Goals: Week 2:  PT Short Term Goal 1 (Week 2): Pt will transfer bed<>chair w/ mod assist consistently PT Short Term Goal 2 (Week 2): Pt will ambulate 30' w/ LRAD, max assist PT Short Term Goal 3 (Week 2): Pt will self-propel w/c 50' w/ min assist PT Short Term Goal 4 (Week 2): Pt will attend to R environment during functional mobility w/ min cues 100% of the time   Skilled Therapeutic Interventions/Progress Updates:    Pt supine in bed upon PT arrival, agreeable to therapy tx and denies pain. Pt supine in bed donned pants with total assist to loop LEs through, pt performed bridge to pull pants over hips. Pt transferred to sitting EOB with mod assist, cues for techniques. Pt performed max assist squat pivot transfer to w/c. Pt transported to the gym. Pt performed sit<>stands x 4 this session at rail in hall with min-mod assist, in standing pt worked on standing balance with and without UE support while performing reaching tasks. In standing pt worked on pre-gait stepping with R LE while working on L LE stance control and weightbearing, mod assist with rail for UE support, manual facilitation for increased hip/trunk extension. Pt transported back to room and left in w/c with needs in reach and chair alarm set.   Therapy Documentation Precautions:  Precautions Precautions: Fall Restrictions Weight Bearing Restrictions: No   Therapy/Group: Individual Therapy  Cresenciano Genre, PT, DPT 02/15/2019, 8:01 AM

## 2019-02-15 NOTE — Plan of Care (Signed)
  Problem: Consults Goal: RH GENERAL PATIENT EDUCATION Description See Patient Education module for education specifics. Outcome: Progressing   Problem: RH BOWEL ELIMINATION Goal: RH STG MANAGE BOWEL WITH ASSISTANCE Description STG Manage Bowel with Assistance. Outcome: Progressing   Problem: RH BLADDER ELIMINATION Goal: RH STG MANAGE BLADDER WITH ASSISTANCE Description STG Manage Bladder With Assistance Outcome: Progressing   Problem: RH SKIN INTEGRITY Goal: RH STG SKIN FREE OF INFECTION/BREAKDOWN Outcome: Progressing   Problem: RH SAFETY Goal: RH STG ADHERE TO SAFETY PRECAUTIONS W/ASSISTANCE/DEVICE Description STG Adhere to Safety Precautions With Assistance/Device. Outcome: Progressing   Problem: RH PAIN MANAGEMENT Goal: RH STG PAIN MANAGED AT OR BELOW PT'S PAIN GOAL Outcome: Progressing   Problem: RH KNOWLEDGE DEFICIT GENERAL Goal: RH STG INCREASE KNOWLEDGE OF SELF CARE AFTER HOSPITALIZATION Outcome: Progressing   Problem: Consults Goal: RH STROKE PATIENT EDUCATION Description See Patient Education module for education specifics  Outcome: Progressing

## 2019-02-15 NOTE — Progress Notes (Signed)
PHYSICAL MEDICINE & REHABILITATION PROGRESS NOTE   Subjective/Complaints: Patient seen sitting up in bed eating breakfast this morning..  She states she slept well overnight.  She notes headache this morning improved with Tylenol.  ROS: Denies CP, shortness of breath, nausea, vomiting, diarrhea.  Objective:   No results found. No results for input(s): WBC, HGB, HCT, PLT in the last 72 hours. No results for input(s): NA, K, CL, CO2, GLUCOSE, BUN, CREATININE, CALCIUM in the last 72 hours.  Intake/Output Summary (Last 24 hours) at 02/15/2019 1249 Last data filed at 02/14/2019 1854 Gross per 24 hour  Intake 480 ml  Output -  Net 480 ml     Physical Exam: Vital Signs Blood pressure (!) 155/85, pulse 72, temperature 98.1 F (36.7 C), temperature source Axillary, resp. rate 18, weight 65.2 kg, SpO2 97 %. Constitutional: NAD.  Vital signs reviewed. HENT: Normocephalic.  Atraumatic Eyes: EOMI. No discharge. Cardiovascular: No JVD. Respiratory: Normal effort. GI: BS +. Non-distended. Musc: No edema or tenderness in extremities. Neurologic: Alert and oriented, except for date of month Dysarthria, stable Motor:  0/5 LUE, stable 2/5 hip flexion, distally 0/5 LLE, stable Increased tone LUE/LLE, unchanged Skin: Warm and dry.  Intact. Psych: Slowed  Assessment/Plan: 1. Functional deficits secondary to RIght Basal ganglia infarct which require 3+ hours per day of interdisciplinary therapy in a comprehensive inpatient rehab setting.  Physiatrist is providing close team supervision and 24 hour management of active medical problems listed below.  Physiatrist and rehab team continue to assess barriers to discharge/monitor patient progress toward functional and medical goals  Care Tool:  Bathing    Body parts bathed by patient: Chest, Front perineal area, Abdomen, Buttocks, Right upper leg, Left upper leg   Body parts bathed by helper: Right arm, Left arm, Right lower leg,  Left lower leg, Face     Bathing assist Assist Level: Maximal Assistance - Patient 24 - 49%     Upper Body Dressing/Undressing Upper body dressing Upper body dressing/undressing activity did not occur (including orthotics): Refused What is the patient wearing?: Hospital gown only    Upper body assist Assist Level: Maximal Assistance - Patient 25 - 49%    Lower Body Dressing/Undressing Lower body dressing      What is the patient wearing?: Incontinence brief     Lower body assist Assist for lower body dressing: Total Assistance - Patient < 25%     Toileting Toileting    Toileting assist Assist for toileting: Dependent - Patient 0%     Transfers Chair/bed transfer  Transfers assist     Chair/bed transfer assist level: Maximal Assistance - Patient 25 - 49%     Locomotion Ambulation   Ambulation assist   Ambulation activity did not occur: Safety/medical concerns  Assist level: 2 helpers Assistive device: Other (comment)(rail in hallway) Max distance: 15'   Walk 10 feet activity   Assist  Walk 10 feet activity did not occur: Safety/medical concerns  Assist level: 2 helpers Assistive device: Other (comment)(rail in hallway)   Walk 50 feet activity   Assist Walk 50 feet with 2 turns activity did not occur: Safety/medical concerns         Walk 150 feet activity   Assist Walk 150 feet activity did not occur: Safety/medical concerns         Walk 10 feet on uneven surface  activity   Assist Walk 10 feet on uneven surfaces activity did not occur: Safety/medical concerns  Wheelchair     Assist Will patient use wheelchair at discharge?: Yes Type of Wheelchair: Manual Wheelchair activity did not occur: Refused  Wheelchair assist level: Maximal Assistance - Patient 25 - 49% Max wheelchair distance: 14'    Wheelchair 50 feet with 2 turns activity    Assist    Wheelchair 50 feet with 2 turns activity did not occur:  Refused   Assist Level: Maximal Assistance - Patient 25 - 49%   Wheelchair 150 feet activity     Assist Wheelchair 150 feet activity did not occur: Safety/medical concerns        Medical Problem List and Plan: 1.Left side weaknesssecondary to multiple right brain infarcts adjacent to previous right MCA infarcts on 02/04/2019 in the setting of 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 Follow-up outpatient interventional radiology for ICA aneurysm  Continue CIR  WHO, PRAFO ordered for left side for spasticity/contracture mgt  Repeat head CT 4/1 reviewed, showing evolving infarct as well as chronic infarct. 2. Antithrombotics: -DVT/anticoagulation:Subcutaneous Lovenox -antiplatelet therapy: Aspirin 325 mg daily 3. Pain Management:Lyrica 75 mg daily, Tylenol as needed  Topamax started on 3/31, DC'd on 4/2 due to Lethargy 4. Mood/bipolar disorder -antipsychotic agents: Risperdal 1 mg daily at bedtime 5. Neuropsych: This patientiscapable of making decisions on herown behalf. 6. Skin/Wound Care:Routine skin checks 7. Fluids/Electrolytes/Nutrition:Routine in and out's  8. Hypertension. Monitor with increased mobility. Patient on Norvasc 5 mg daily, Coreg 12.5 mg twice a day prior to admission. Resume as needed Vitals:   02/15/19 0605 02/15/19 0700  BP:    Pulse:    Resp:    Temp: 99.5 F (37.5 C) 98.1 F (36.7 C)  SpO2:     Norvasc 2.5 started on 3/31, increased to 5 on 4/2  Remains elevated, will consider further increase tomorrow 9. Diastolic congestive heart failure. Monitor for any signs of fluid overload Filed Weights   02/14/19 0500 02/14/19 0658 02/15/19 0604  Weight: 61.9 kg 64.5 kg 65.2 kg   ?  Reliability on 4/5 10. History oftobacco/cocaine use. Provide counseling 11. Hyperlipidemia. Lipitor 12.  Constipation: Improving 13.  Spasticity hemiparesis  Tizanidine 2 mg twice daily  started on 4/3, increased to 4 mg on 4/5  Baclofen 5 3 times daily started on 3/31, DC'd on 4/2 due to Lethargy 14.  Prediabetes  Borderline, will consider changing to carb modified diet if necessary 15.  Hypokalemia  Potassium 3.8 on 4/2 after supplementation  Labs ordered for tomorrow 16.  Hypoalbuminemia  Supplement initiated on 3/30 17.  Leukopenia: Resolved  WBCs 4.0 on 4/2  Continue to monitor 18.  Thrombocytopenia: Resolved  Platelets 173 on 4/2  Continue to monitor 19.  Hyponatremia  Sodium 132 on 4/2  Labs ordered for tomorrow  Continue to monitor  LOS: 9 days A FACE TO FACE EVALUATION WAS PERFORMED  Quillan Whitter Karis Juba 02/15/2019, 12:49 PM

## 2019-02-16 ENCOUNTER — Inpatient Hospital Stay (HOSPITAL_COMMUNITY): Payer: Medicare Other | Admitting: Physical Therapy

## 2019-02-16 ENCOUNTER — Inpatient Hospital Stay (HOSPITAL_COMMUNITY): Payer: Medicare Other | Admitting: Occupational Therapy

## 2019-02-16 ENCOUNTER — Inpatient Hospital Stay (HOSPITAL_COMMUNITY): Payer: Medicare Other

## 2019-02-16 ENCOUNTER — Inpatient Hospital Stay (HOSPITAL_COMMUNITY): Payer: Medicare Other | Admitting: Speech Pathology

## 2019-02-16 DIAGNOSIS — R509 Fever, unspecified: Secondary | ICD-10-CM

## 2019-02-16 LAB — BASIC METABOLIC PANEL
Anion gap: 10 (ref 5–15)
BUN: 14 mg/dL (ref 8–23)
CO2: 23 mmol/L (ref 22–32)
Calcium: 8.6 mg/dL — ABNORMAL LOW (ref 8.9–10.3)
Chloride: 101 mmol/L (ref 98–111)
Creatinine, Ser: 0.75 mg/dL (ref 0.44–1.00)
GFR calc Af Amer: >60 mL/min (ref >60)
GFR calc non Af Amer: >60 mL/min (ref >60)
Glucose, Bld: 147 mg/dL — ABNORMAL HIGH (ref 70–99)
Potassium: 4.2 mmol/L (ref 3.5–5.1)
Sodium: 134 mmol/L — ABNORMAL LOW (ref 135–145)

## 2019-02-16 LAB — URINALYSIS, COMPLETE (UACMP) WITH MICROSCOPIC
Bacteria, UA: NONE SEEN
Bilirubin Urine: NEGATIVE
Glucose, UA: NEGATIVE mg/dL
Hgb urine dipstick: NEGATIVE
Ketones, ur: NEGATIVE mg/dL
Leukocytes,Ua: NEGATIVE
Nitrite: NEGATIVE
Protein, ur: NEGATIVE mg/dL
Specific Gravity, Urine: 1.02 (ref 1.005–1.030)
pH: 5 (ref 5.0–8.0)

## 2019-02-16 NOTE — Progress Notes (Signed)
[place] PHYSICAL MEDICINE & REHABILITATION PROGRESS NOTE   Subjective/Complaints: Patient seen sitting up in bed this morning.  She states she slept well overnight.  She complains of mild headache this morning.  She has not asked for medication yet.  Noted to have fever overnight, masked with Tylenol.  ROS: Denies CP, shortness of breath, nausea, vomiting, diarrhea.  Objective:   No results found. No results for input(s): WBC, HGB, HCT, PLT in the last 72 hours. No results for input(s): NA, K, CL, CO2, GLUCOSE, BUN, CREATININE, CALCIUM in the last 72 hours.  Intake/Output Summary (Last 24 hours) at 02/16/2019 1009 Last data filed at 02/16/2019 0905 Gross per 24 hour  Intake 640 ml  Output -  Net 640 ml     Physical Exam: Vital Signs Blood pressure (!) 163/90, pulse 69, temperature 98.7 F (37.1 C), resp. rate 18, weight 63.8 kg, SpO2 99 %. Constitutional: NAD.  Vital signs reviewed. HENT: Normocephalic.  Atraumatic. Eyes: EOMI.  No discharge. Cardiovascular: No JVD. Respiratory: Normal effort. GI: BS +. Non-distended. Musc: No edema or tenderness in extremities. Neurologic: Alert and oriented, except for date of month Facial weakness Dysarthria, unchanged Motor:  0/5 LUE, stable 2/5 hip flexion, distally 0/5 LLE, stable Increased tone LUE/LLE, unchanged Skin: Warm and dry.  Intact. Psych: Slowed  Assessment/Plan: 1. Functional deficits secondary to RIght Basal ganglia infarct which require 3+ hours per day of interdisciplinary therapy in a comprehensive inpatient rehab setting.  Physiatrist is providing close team supervision and 24 hour management of active medical problems listed below.  Physiatrist and rehab team continue to assess barriers to discharge/monitor patient progress toward functional and medical goals  Care Tool:  Bathing    Body parts bathed by patient: Chest, Front perineal area, Abdomen, Buttocks, Right upper leg, Left upper leg   Body parts  bathed by helper: Right arm, Left arm, Right lower leg, Left lower leg, Face     Bathing assist Assist Level: Maximal Assistance - Patient 24 - 49%     Upper Body Dressing/Undressing Upper body dressing Upper body dressing/undressing activity did not occur (including orthotics): Refused What is the patient wearing?: Hospital gown only    Upper body assist Assist Level: Maximal Assistance - Patient 25 - 49%    Lower Body Dressing/Undressing Lower body dressing      What is the patient wearing?: Incontinence brief     Lower body assist Assist for lower body dressing: Total Assistance - Patient < 25%     Toileting Toileting    Toileting assist Assist for toileting: Dependent - Patient 0%     Transfers Chair/bed transfer  Transfers assist     Chair/bed transfer assist level: Maximal Assistance - Patient 25 - 49%     Locomotion Ambulation   Ambulation assist   Ambulation activity did not occur: Safety/medical concerns  Assist level: 2 helpers Assistive device: Other (comment)(rail in hallway) Max distance: 15'   Walk 10 feet activity   Assist  Walk 10 feet activity did not occur: Safety/medical concerns  Assist level: 2 helpers Assistive device: Other (comment)(rail in hallway)   Walk 50 feet activity   Assist Walk 50 feet with 2 turns activity did not occur: Safety/medical concerns         Walk 150 feet activity   Assist Walk 150 feet activity did not occur: Safety/medical concerns         Walk 10 feet on uneven surface  activity   Assist Walk 10 feet  on uneven surfaces activity did not occur: Safety/medical concerns         Wheelchair     Assist Will patient use wheelchair at discharge?: Yes Type of Wheelchair: Manual Wheelchair activity did not occur: Refused  Wheelchair assist level: Maximal Assistance - Patient 25 - 49% Max wheelchair distance: 75'    Wheelchair 50 feet with 2 turns activity    Assist     Wheelchair 50 feet with 2 turns activity did not occur: Refused   Assist Level: Maximal Assistance - Patient 25 - 49%   Wheelchair 150 feet activity     Assist Wheelchair 150 feet activity did not occur: Safety/medical concerns        Medical Problem List and Plan: 1.Left side weaknesssecondary to multiple right brain infarcts adjacent to previous right MCA infarcts on 02/04/2019 in the setting of 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 Follow-up outpatient interventional radiology for ICA aneurysm  Continue CIR  WHO, PRAFO ordered for left side for spasticity/contracture mgt  Repeat head CT 4/1 reviewed, showing evolving infarct as well as chronic infarct. 2. Antithrombotics: -DVT/anticoagulation:Subcutaneous Lovenox -antiplatelet therapy: Aspirin 325 mg daily 3. Pain Management:Lyrica 75 mg daily, Tylenol as needed  Topamax started on 3/31, DC'd on 4/2 due to Lethargy 4. Mood/bipolar disorder -antipsychotic agents: Risperdal 1 mg daily at bedtime 5. Neuropsych: This patientiscapable of making decisions on herown behalf. 6. Skin/Wound Care:Routine skin checks 7. Fluids/Electrolytes/Nutrition:Routine in and out's  8. Hypertension. Monitor with increased mobility. Patient on Norvasc 5 mg daily, Coreg 12.5 mg twice a day prior to admission. Resume as needed Vitals:   02/16/19 0549 02/16/19 0551  BP: (!) 142/122 (!) 163/90  Pulse: 70 69  Resp:  18  Temp: 98.7 F (37.1 C) 98.7 F (37.1 C)  SpO2: 100% 99%   Norvasc 2.5 started on 3/31, increased to 5 on 4/2  Labile on 4/6 9. Diastolic congestive heart failure. Monitor for any signs of fluid overload Filed Weights   02/14/19 0658 02/15/19 0604 02/16/19 0551  Weight: 64.5 kg 65.2 kg 63.8 kg   Stable on 4/6 10. History oftobacco/cocaine use. Provide counseling 11. Hyperlipidemia. Lipitor 12.  Constipation: Improving 13.  Spasticity  hemiparesis  Tizanidine 2 mg twice daily started on 4/3, increased to 4 mg on 4/5  Baclofen 5 3 times daily started on 3/31, DC'd on 4/2 due to Lethargy  Will consider further increase if tolerated 14.  Prediabetes  Borderline, will consider changing to carb modified diet if necessary 15.  Hypokalemia  Potassium 3.8 on 4/2 after supplementation  Labs pending 16.  Hypoalbuminemia  Supplement initiated on 3/30 17.  Leukopenia: Resolved  WBCs 4.0 on 4/2  Continue to monitor 18.  Thrombocytopenia: Resolved  Platelets 173 on 4/2  Continue to monitor 19.  Hyponatremia  Sodium 132 on 4/2  Labs pending  Continue to monitor 20.  Fever  Blood cultures, UA/urine culture, chest x-ray ordered  LOS: 10 days A FACE TO FACE EVALUATION WAS PERFORMED   Karis Juba 02/16/2019, 10:09 AM

## 2019-02-16 NOTE — Progress Notes (Signed)
Physical Therapy Session Note  Patient Details  Name: KAVIA ASELTINE MRN: [id number] Date of Birth: [birth year]  Today's Date: 02/16/2019 PT Individual Time: 1415-1505 PT Individual Time Calculation (min): 50 min   Short Term Goals: Week 2:  PT Short Term Goal 1 (Week 2): Pt will transfer bed<>chair w/ mod assist consistently PT Short Term Goal 2 (Week 2): Pt will ambulate 30' w/ LRAD, max assist PT Short Term Goal 3 (Week 2): Pt will self-propel w/c 50' w/ min assist PT Short Term Goal 4 (Week 2): Pt will attend to R environment during functional mobility w/ min cues 100% of the time   Skilled Therapeutic Interventions/Progress Updates:   Pt in supine and agreeable to therapy, pain 8/10 in head 2/2 headache, made RN aware. Supine>sit transfer w/ mod-max assist and performed stedy transfer to w/c for energy conservation. Worked on w/c propulsion via R hemi technique, 50' in hallway. Pt able to coordinate R hemi movement, however lacking enough strength and energy to self-propel fully. Total assist remainder of way to/from therapy gym. Worked on sit<>stands to R hemi walker w/ max assist, verbal, tactile, and manual cues to overcome L lateropulsion upon transitional movement. Max manual cues to stabilize in stance w/ facilitation of neutral L hip extension and upright posture. Utilized Production assistant, radio w/ improved midline orientation and improved upright posture. Added L DF ACE wrap to work on L forward and backward stepping. Tactile cue of ace wrap at L calf appeared to improved L quad activation in single leg stance. Max assist to progress LLE, however improved toe clearance. Performed 4-5 stands in total for 30-60 sec at a time. Pt then stated "I'm done". When questioned further, pt stated "I'm done with therapy because I'm tired". Attempted to engage pt w/ multiple other tasks included seated tasks and NuStep, pt continued to refuse to participate. Returned to room and performed stedy  transfer back to EOB and mod assist sit>supine. Educated pt on importance of participation to d/c to home, discussed that if she remains at her current functional level she may not be able to safely go home w/ her boyfriend. Pt verbalized understanding however demonstrates poor insight into how deficits impact her function.   Missed 25 min of skilled PT 2/2 refusal.   Therapy Documentation Precautions:  Precautions Precautions: Fall Restrictions Weight Bearing Restrictions: No Pain: Pain Assessment Pain Scale: 0-10 Pain Score: 5  Pain Type: Acute pain Pain Location: Head Pain Descriptors / Indicators: Headache Pain Onset: On-going Patients Stated Pain Goal: 3 Pain Intervention(s): Medication (See eMAR)  Therapy/Group: Individual Therapy  Amaka Gluth K Kennen Stammer 02/16/2019, 3:09 PM

## 2019-02-16 NOTE — Progress Notes (Signed)
Speech Language Pathology Daily Session Note  Patient Details  Name: ASHALA HINZ MRN: [id number] Date of Birth: May 06, 1956  Today's Date: 02/16/2019 SLP Individual Time: 1030-1110 SLP Individual Time Calculation (min): 40 min  Short Term Goals: Week 2: SLP Short Term Goal 1 (Week 2): Patient will utilize speech intelligibility strategies at the sentence level with achieve ~90% intelligibility with Min A verbal cues.  SLP Short Term Goal 2 (Week 2): Patient will demonstrate sustained attention to tasks for ~15 minutes with Mod A verbal cues.  SLP Short Term Goal 3 (Week 2): Patient will demonstrate functional problem solving for basic and familiar tasks with Min A verbal cues.  SLP Short Term Goal 4 (Week 2): Patient will attend to left field of enviornment during functional tasks with Min A verbal cues.  SLP Short Term Goal 5 (Week 2): Patient will respond to questions, etc in 75% of opportunities with Max A verbal cues.    Skilled Therapeutic Interventions: Skilled treatment session focused on cognitive goals. SLP facilitated session by providing Max A verbal cues for initiation, attention/scanning to left field of environment, sustained attention and error awareness during a basic calendar making task. Patient continues to report lethargy with intermittent encouragement for participation. Patient left upright in wheelchair with alarm on and all needs within reach. Continue with current plan of care.      Pain No/Denies Pain   Therapy/Group: Individual Therapy  Boby Eyer 02/16/2019, 3:18 PM

## 2019-02-16 NOTE — Progress Notes (Signed)
Occupational Therapy Session Note  Patient Details  Name: [name] MRN: [id number] Date of Birth: Dec 11, 1955  Today's Date: 02/16/2019 OT Individual Time: 0930-1030 OT Individual Time Calculation (min): 60 min    Short Term Goals: Week 1:  OT Short Term Goal 1 (Week 1): Pt will perform toilet transfer with max A in order to decrease level of assist with functional transfers. OT Short Term Goal 1 - Progress (Week 1): Not met OT Short Term Goal 2 (Week 1): Pt will maintain dynamic balance during functional tasks seated EOB with min A overall. OT Short Term Goal 2 - Progress (Week 1): Met OT Short Term Goal 3 (Week 1): Pt will perform UB dressing with mod A in order to decrease level of assist with self care. OT Short Term Goal 3 - Progress (Week 1): Not met  Skilled Therapeutic Interventions/Progress Updates:    Pt received in bed agreeable to therapy.  She did some time to wake up and then she was able to sit to EOB with mod A. Pt stood in stedy with only min A and was transferred to toilet. Pt had large incontinent stool in brief.  Pt did sit on toilet to further go to the bathroom and did weight shift to L to self cleanse 3x, but pt unable to fully cleanse self. Pt stood in stedy for total A cleansing for her bottom as it had smeared on buttocks and top of thigh.  Standing in the stedy, pt continually leaned to her left and unable to find midline without max A.  Pt sat back on BSC over toilet to bathe with mod A UB and max LB.  Transferred back to w/c and donned shirt with max A.  Pants and socks donned over feet for pt, then pt stood to sink with min A.  Improved midline control at sink.  Pants pulled up for pt.   Pt's speech therapist arrived for her next session.    Therapy Documentation Precautions:  Precautions Precautions: Fall Restrictions Weight Bearing Restrictions: No  Pain: Pain Assessment Pain Scale: 0-10 Pain Score: 7  Faces Pain Scale: No hurt Pain Type: Chronic  pain Pain Location: Head Pain Descriptors / Indicators: Headache Pain Onset: Gradual Patients Stated Pain Goal: 3 Pain Intervention(s): Medication (See eMAR)   Therapy/Group: Individual Therapy  [place] 02/16/2019, 10:58 AM

## 2019-02-16 NOTE — Progress Notes (Signed)
Occupational Therapy Session Note  Patient Details  Name: [name] MRN: [id number] Date of Birth: [birth year]  Today's Date: 02/16/2019 OT Individual Time: 3329-5188 OT Individual Time Calculation (min): 42 min    Short Term Goals: Week 2:  OT Short Term Goal 1 (Week 2): Pt will perform UB dressing with mod A in order to decrease level of assist with self care. OT Short Term Goal 2 (Week 2): Pt will perform toilet transfer with max A in order to decrease level of assist with functional transfers. OT Short Term Goal 3 (Week 2): Pt will complete sit<>stand with max A of 1 person without Stedy in preparation for BADL tasks  Skilled Therapeutic Interventions/Progress Updates:    Upon entering the room, pt supine in bed with HOB elevated and finishing lunch. Pt verbalized 8/10 headache and requested pain medication. Medication given this session. Pt rolling L <> R with mod A to check brief for incontinence. Pt declined toileting and sitting EOB this session. Pt washing face and hands with set up A to obtain needed items. Pt requesting to contact her caregiver. Written directions on paper and pt need min cuing to follow directions. OT repositioned pt after she spoke to caregiver. Bed alarm activated and call bell within reach. Pt very fatigued and falling asleep as OT exiting the room.   Therapy Documentation Precautions:  Precautions Precautions: Fall Restrictions Weight Bearing Restrictions: No General: General PT Missed Treatment Reason: Patient unwilling to participate Vital Signs: Therapy Vitals Temp: 98.1 F (36.7 C) Temp Source: Oral Pulse Rate: 75 Resp: 18 BP: 122/75 Patient Position (if appropriate): Lying Oxygen Therapy SpO2: 98 % O2 Device: Room Air Pain: Pain Assessment Pain Scale: 0-10 Pain Score: 5  Pain Type: Acute pain Pain Location: Head Pain Descriptors / Indicators: Headache Pain Onset: On-going Patients Stated Pain Goal: 3 Pain Intervention(s):  Medication (See eMAR)   Therapy/Group: Individual Therapy  Alen Bleacher 02/16/2019, 4:13 PM

## 2019-02-17 ENCOUNTER — Inpatient Hospital Stay (HOSPITAL_COMMUNITY): Payer: Medicare Other | Admitting: Occupational Therapy

## 2019-02-17 ENCOUNTER — Inpatient Hospital Stay (HOSPITAL_COMMUNITY): Payer: Medicare Other | Admitting: Physical Therapy

## 2019-02-17 ENCOUNTER — Inpatient Hospital Stay (HOSPITAL_COMMUNITY): Payer: Medicare Other | Admitting: Speech Pathology

## 2019-02-17 LAB — URINE CULTURE: Culture: NO GROWTH

## 2019-02-17 MED ORDER — TIZANIDINE HCL 2 MG PO TABS
6.0000 mg | ORAL_TABLET | Freq: Two times a day (BID) | ORAL | Status: DC
  Administered 2019-02-17 – 2019-02-20 (×6): 6 mg via ORAL
  Filled 2019-02-17 (×6): qty 3

## 2019-02-17 NOTE — Progress Notes (Signed)
Occupational Therapy Session Note MAKEUP SESSION  Patient Details  Name: [name] MRN: [id number] Date of Birth: 07-Jun-1956  Today's Date: 02/17/2019 OT Individual Time: 0915-1000 OT Individual Time Calculation (min): 45 min    Short Term Goals: Week 1:  OT Short Term Goal 1 (Week 1): Pt will perform toilet transfer with max A in order to decrease level of assist with functional transfers. OT Short Term Goal 1 - Progress (Week 1): Not met OT Short Term Goal 2 (Week 1): Pt will maintain dynamic balance during functional tasks seated EOB with min A overall. OT Short Term Goal 2 - Progress (Week 1): Met OT Short Term Goal 3 (Week 1): Pt will perform UB dressing with mod A in order to decrease level of assist with self care. OT Short Term Goal 3 - Progress (Week 1): Not met Week 2:  OT Short Term Goal 1 (Week 2): Pt will perform UB dressing with mod A in order to decrease level of assist with self care. OT Short Term Goal 2 (Week 2): Pt will perform toilet transfer with max A in order to decrease level of assist with functional transfers. OT Short Term Goal 3 (Week 2): Pt will complete sit<>stand with max A of 1 person without Stedy in preparation for BADL tasks      Skilled Therapeutic Interventions/Progress Updates:    Pt received in bed and answered simple y/n questions but not engaging that well.  Recommended she try to sit on toilet. Used stedy to transfer pt from bed to toilet with min A as she pulled up with bar very easily today and was not leaning to the left.  Pt's brief was removed and it was soaked with urine. Pt sat for a few minutes but did not need to use further.  She did clean front and back areas from sitting on toilet with CGA for balance.  Pt was more alert at this point.  Donned new brief and pants over feet and then came into a stand using grab bar with min A.  Once standing needed mod A as tone in LLE increased and pt unable to use it to stand on.  Pt sat back down,  tone relaxed and then she completed stand pivot to w/c with mod A.   Pt then completed oral care with set up at sink and washed face.   Pt stated she wanted to stay sitting up.  Belt alarm on and all needs met.    Therapy Documentation Precautions:  Precautions Precautions: Fall Restrictions Weight Bearing Restrictions: No    Vital Signs: Therapy Vitals Temp: 98 F (36.7 C) Pulse Rate: 75 Resp: 19 BP: (!) 156/74 Patient Position (if appropriate): Lying Oxygen Therapy SpO2: 94 % O2 Device: Room Air Pain: Pain Assessment Pain Scale: 0-10 Pain Score: 7  Pain Type: Chronic pain Pain Location: Head Pain Orientation: Anterior  Therapy/Group: Individual Therapy  [place] 02/17/2019, 8:30 AM

## 2019-02-17 NOTE — Progress Notes (Signed)
Occupational Therapy Session Note  Patient Details  Name: [name] MRN: [id number] Date of Birth: Mar 14, 1956  Today's Date: 02/17/2019 OT Individual Time: 1100-1154 OT Individual Time Calculation (min): 54 min   Short Term Goals: Week 2:  OT Short Term Goal 1 (Week 2): Pt will perform UB dressing with mod A in order to decrease level of assist with self care. OT Short Term Goal 2 (Week 2): Pt will perform toilet transfer with max A in order to decrease level of assist with functional transfers. OT Short Term Goal 3 (Week 2): Pt will complete sit<>stand with max A of 1 person without Stedy in preparation for BADL tasks  Skilled Therapeutic Interventions/Progress Updates:    Pt greeted seated in wc and agreeable to OT treatment session. Pt brought to therapy gym for treatment session focused on sit<>stand, standing balance/endurance. OT played pt preferred music during session to improve mood and participation. Utilized mirror to provide feedback on posture and midline orientation. Pt completed 5 sit<>stands using hemi walker and overall min A with OT blocking L knee and facilitation to bring hips and trunk forward into extension. Pt tolerated 20 seconds standing at each bout with lateral lean to the L and posterior bias. Pt noted to be incontinent of bladder. OT returned to the room and had pt stand again with mod A this time and pt unable to maintain standing balance enough for OT to change brief. Stedy used to assist with balance while OT changed brief and completed peri-care. OT provided gentle massage and stretching to L UE to help decrease tone. Pt left seated in wc with alarm belt on and call bell in reach.   Therapy Documentation Precautions:  Precautions Precautions: Fall Restrictions Weight Bearing Restrictions: No Pain: Pain Assessment Pain Score: denies pain   Therapy/Group: Individual Therapy  Mal Amabile 02/17/2019, 11:57 AM

## 2019-02-17 NOTE — Progress Notes (Addendum)
Physical Therapy Session Note  Patient Details  Name: [name] MRN: [id number] Date of Birth: 1956/04/10  Today's Date: 02/17/2019 PT Individual Time: 1415-1515 PT Individual Time Calculation (min): 60 min   Short Term Goals: Week 2:  PT Short Term Goal 1 (Week 2): Pt will transfer bed<>chair w/ mod assist consistently PT Short Term Goal 2 (Week 2): Pt will ambulate 30' w/ LRAD, max assist PT Short Term Goal 3 (Week 2): Pt will self-propel w/c 50' w/ min assist PT Short Term Goal 4 (Week 2): Pt will attend to R environment during functional mobility w/ min cues 100% of the time   Skilled Therapeutic Interventions/Progress Updates:   Pt in w/c and agreeable to therapy, denies pain. Total assist w/c transport to/from therapy gym. Squat pivot transfer to/from NuStep w/ max-total assist. Performed NuStep 6 min @ level 1 for LE muscle activation and reciprocal movement pattern. Intermittent bouts of using BLEs only, needed tactile and manual cues throughout task for neutral LLE alignment. Worked on pre-gait and gait w/ hemiwalker remainder of session. Ambulated 5' x2 and 15' w/ w/c follow and max assist. Max manual assist needed for LLE step placement, lateral weight shifting, and L hip extensor activation in single leg stance. Mod-max manual facilitation of L terminal knee extension, although pt w/ adequate quad activation, unable to achieve knee extension 2/2 increased L hamstring tone. Midline orientation much improved this session w/ hemiwalker support, maintained upright w/ intermittent tactile cues. Trialed use of L DF ACE wrap, however kept pt in too much knee flexion, utilized toe cap w/ improved ease of L swing limb advancement. Worked on L hip flexor activation in stance w/ kicking to visual target, unable to perform w/o max-total assist, trace hip flexor activation noted. Upon last bout of standing, pt refused to participate in further activity despite max encouragement. Returned to room  and performed stand pivot w/ max assist. Ended session in supine, all needs in reach.   Missed 15 min of skilled PT 2/2 refusal.   Therapy Documentation Precautions:  Precautions Precautions: Fall Restrictions Weight Bearing Restrictions: No  Therapy/Group: Individual Therapy  [name] 02/17/2019, 3:18 PM

## 2019-02-17 NOTE — Progress Notes (Signed)
PHYSICAL MEDICINE & REHABILITATION PROGRESS NOTE   Subjective/Complaints: Patient seen sitting up in bed this morning.  She states she slept well overnight.  She states she has a headache this morning.  Discussed with therapies yesterday regarding patient's lack of motivation to participate in therapies.  Discussed with patient this morning as well.  ROS: + Headache.  Denies CP, shortness of breath, nausea, vomiting, diarrhea.  Objective:   Dg Chest 2 View  Result Date: 02/16/2019 CLINICAL DATA:  Fever. EXAM: CHEST - 2 VIEW COMPARISON:  09/16/2008 and 09/05/2008 FINDINGS: Lungs are adequately inflated without focal airspace consolidation or effusion. Mild stable cardiomegaly. Remainder of the exam is unchanged. IMPRESSION: No active cardiopulmonary disease. Electronically Signed   By: Elberta Fortis M.D.   On: 02/16/2019 11:55   No results for input(s): WBC, HGB, HCT, PLT in the last 72 hours. Recent Labs    02/16/19 0915  NA 134*  K 4.2  CL 101  CO2 23  GLUCOSE 147*  BUN 14  CREATININE 0.75  CALCIUM 8.6*    Intake/Output Summary (Last 24 hours) at 02/17/2019 0929 Last data filed at 02/17/2019 0753 Gross per 24 hour  Intake 402 ml  Output 250 ml  Net 152 ml     Physical Exam: Vital Signs Blood pressure (!) 156/74, pulse 75, temperature 98 F (36.7 C), resp. rate 19, weight 65.4 kg, SpO2 94 %. Constitutional: NAD.  Vital signs reviewed. HENT: Normocephalic.  Atraumatic.  Poor dentition. Eyes: EOMI.  No discharge. Cardiovascular: No JVD. Respiratory: Normal effort. GI: BS +. Non-distended. Musc: No edema or tenderness in extremities. Neurologic: Alert and oriented, except for date of month Facial weakness, stable Dysarthria, stable Motor:  0/5 LUE, stable 2/5 hip flexion, distally 0/5 LLE, stable Increased tone LUE/LLE, stable Skin: Warm and dry.  Intact. Psych: Slowed  Assessment/Plan: 1. Functional deficits secondary to RIght Basal ganglia infarct which  require 3+ hours per day of interdisciplinary therapy in a comprehensive inpatient rehab setting.  Physiatrist is providing close team supervision and 24 hour management of active medical problems listed below.  Physiatrist and rehab team continue to assess barriers to discharge/monitor patient progress toward functional and medical goals  Care Tool:  Bathing    Body parts bathed by patient: Chest, Front perineal area, Abdomen, Right upper leg, Left upper leg   Body parts bathed by helper: Buttocks, Left lower leg, Right lower leg     Bathing assist Assist Level: Maximal Assistance - Patient 24 - 49%     Upper Body Dressing/Undressing Upper body dressing Upper body dressing/undressing activity did not occur (including orthotics): Refused What is the patient wearing?: Pull over shirt    Upper body assist Assist Level: Maximal Assistance - Patient 25 - 49%    Lower Body Dressing/Undressing Lower body dressing      What is the patient wearing?: Incontinence brief, Pants     Lower body assist Assist for lower body dressing: Total Assistance - Patient < 25%     Toileting Toileting    Toileting assist Assist for toileting: Maximal Assistance - Patient 25 - 49%     Transfers Chair/bed transfer  Transfers assist     Chair/bed transfer assist level: Dependent - mechanical lift     Locomotion Ambulation   Ambulation assist   Ambulation activity did not occur: Safety/medical concerns  Assist level: 2 helpers Assistive device: Other (comment)(rail in hallway) Max distance: 15'   Walk 10 feet activity   Assist  Walk  10 feet activity did not occur: Safety/medical concerns  Assist level: 2 helpers Assistive device: Other (comment)(rail in hallway)   Walk 50 feet activity   Assist Walk 50 feet with 2 turns activity did not occur: Safety/medical concerns         Walk 150 feet activity   Assist Walk 150 feet activity did not occur: Safety/medical  concerns         Walk 10 feet on uneven surface  activity   Assist Walk 10 feet on uneven surfaces activity did not occur: Safety/medical concerns         Wheelchair     Assist Will patient use wheelchair at discharge?: Yes Type of Wheelchair: Manual Wheelchair activity did not occur: Refused  Wheelchair assist level: Maximal Assistance - Patient 25 - 49% Max wheelchair distance: 4150'    Wheelchair 50 feet with 2 turns activity    Assist    Wheelchair 50 feet with 2 turns activity did not occur: Refused   Assist Level: Maximal Assistance - Patient 25 - 49%   Wheelchair 150 feet activity     Assist Wheelchair 150 feet activity did not occur: Safety/medical concerns        Medical Problem List and Plan: 1.Left side weaknesssecondary to multiple right brain infarcts adjacent to previous right MCA infarcts on 02/04/2019 in the setting of 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 Follow-up outpatient interventional radiology for ICA aneurysm  Continue CIR  WHO, PRAFO ordered for left side for spasticity/contracture mgt  Repeat head CT 4/1 reviewed, showing evolving infarct as well as chronic infarct. 2. Antithrombotics: -DVT/anticoagulation:Subcutaneous Lovenox -antiplatelet therapy: Aspirin 325 mg daily 3. Pain Management:Lyrica 75 mg daily, Tylenol as needed  Topamax started on 3/31, DC'd on 4/2 due to Lethargy 4. Mood/bipolar disorder -antipsychotic agents: Risperdal 1 mg daily at bedtime 5. Neuropsych: This patientiscapable of making decisions on herown behalf. 6. Skin/Wound Care:Routine skin checks 7. Fluids/Electrolytes/Nutrition:Routine in and out's  8. Hypertension. Monitor with increased mobility. Patient on Norvasc 5 mg daily, Coreg 12.5 mg twice a day prior to admission. Resume as needed Vitals:   02/16/19 1923 02/17/19 0501  BP: (!) 141/72 (!) 156/74  Pulse:  81 75  Resp: 16 19  Temp: 98 F (36.7 C) 98 F (36.7 C)  SpO2: 100% 94%   Norvasc 2.5 started on 3/31, increased to 5 on 4/2  Labile on 4/7 9. Diastolic congestive heart failure. Monitor for any signs of fluid overload Filed Weights   02/15/19 0604 02/16/19 0551 02/17/19 0501  Weight: 65.2 kg 63.8 kg 65.4 kg   Stable on 4/7 10. History oftobacco/cocaine use. Provide counseling 11. Hyperlipidemia. Lipitor 12.  Constipation: Improving 13.  Spasticity hemiparesis  Tizanidine 2 mg twice daily started on 4/3, increased to 4 mg on 4/5, increased to 6 mg on 4/7  Baclofen 5 3 times daily started on 3/31, DC'd on 4/2 due to Lethargy  Will consider further increase if tolerated 14.  Prediabetes  Blood glucose elevated on 4/6  Continue to monitor, will consider changing to carb modified diet if persistently elevated 15.  Hypokalemia  Potassium 4.2 on 4/6 16.  Hypoalbuminemia  Supplement initiated on 3/30 17.  Leukopenia: Resolved  WBCs 4.0 on 4/2  Continue to monitor 18.  Thrombocytopenia: Resolved  Platelets 173 on 4/2  Continue to monitor 19.  Hyponatremia  Sodium 134 on 4/6  Continue to monitor 20.  Fever:?  Resolved  Blood cultures NGTD  UA  negative, urine culture NGTD  Chest x-ray reviewed, unremarkable for acute process  LOS: 11 days A FACE TO FACE EVALUATION WAS PERFORMED  Zariah Jost Karis Juba 02/17/2019, 9:29 AM

## 2019-02-17 NOTE — Progress Notes (Signed)
Speech Language Pathology Daily Session Note  Patient Details  Name: TZIVIA LOIBL MRN: [id number] Date of Birth: [birth year]  Today's Date: 02/17/2019 SLP Individual Time: 1300-1355 SLP Individual Time Calculation (min): 55 min  Short Term Goals: Week 2: SLP Short Term Goal 1 (Week 2): Patient will utilize speech intelligibility strategies at the sentence level with achieve ~90% intelligibility with Min A verbal cues.  SLP Short Term Goal 2 (Week 2): Patient will demonstrate sustained attention to tasks for ~15 minutes with Mod A verbal cues.  SLP Short Term Goal 3 (Week 2): Patient will demonstrate functional problem solving for basic and familiar tasks with Min A verbal cues.  SLP Short Term Goal 4 (Week 2): Patient will attend to left field of enviornment during functional tasks with Min A verbal cues.  SLP Short Term Goal 5 (Week 2): Patient will respond to questions, etc in 75% of opportunities with Max A verbal cues.   Skilled Therapeutic Interventions: Skilled treatment session focused on cognitive goals. SLP facilitated session by providing Mod-Max A verbal cues for functional problem solving, sustained attention and attention to left field of environment during a 4-step picture sequencing task. Patient left upright in wheelchair with all needs within reach and alarm on. Continue with current plan of care.      Pain No/Denies Pain   Therapy/Group: Individual Therapy  Jaki Hammerschmidt 02/17/2019, 3:30 PM

## 2019-02-18 ENCOUNTER — Inpatient Hospital Stay (HOSPITAL_COMMUNITY): Payer: Medicare Other | Admitting: Physical Therapy

## 2019-02-18 ENCOUNTER — Inpatient Hospital Stay (HOSPITAL_COMMUNITY): Payer: Medicare Other

## 2019-02-18 ENCOUNTER — Inpatient Hospital Stay (HOSPITAL_COMMUNITY): Payer: Medicare Other | Admitting: Speech Pathology

## 2019-02-18 NOTE — Progress Notes (Signed)
Speech Language Pathology Daily Session Note  Patient Details  Name: [name] MRN: [id number] Date of Birth: 1955/12/02  Today's Date: 02/18/2019 SLP Individual Time: 0730-0755 SLP Individual Time Calculation (min): 25 min  Short Term Goals: Week 2: SLP Short Term Goal 1 (Week 2): Patient will utilize speech intelligibility strategies at the sentence level with achieve ~90% intelligibility with Min A verbal cues.  SLP Short Term Goal 2 (Week 2): Patient will demonstrate sustained attention to tasks for ~15 minutes with Mod A verbal cues.  SLP Short Term Goal 3 (Week 2): Patient will demonstrate functional problem solving for basic and familiar tasks with Min A verbal cues.  SLP Short Term Goal 4 (Week 2): Patient will attend to left field of enviornment during functional tasks with Min A verbal cues.  SLP Short Term Goal 5 (Week 2): Patient will respond to questions, etc in 75% of opportunities with Max A verbal cues.   Skilled Therapeutic Interventions: Skilled treatment session focused on cognitive goals. SLP facilitated session by providing extra time and Mod A verbal cues for initiation, problem solving and for patient to scan to left field of environment to locate items on breakfast tray for tray set-up. Patient appeared less lethargic this session compared to previous morning session but continues to require repetitions and more than a reasonable amount of time for verbal initiation in response to questions. Patient left upright in bed with alarm on and all needs within reach. Continue with current plan of care.      Pain No/Denies Pain   Therapy/Group: Individual Therapy  Cerria Randhawa 02/18/2019, 11:29 AM

## 2019-02-18 NOTE — Progress Notes (Signed)
Speech Language Pathology Daily Session Note  Patient Details  Name: [name] MRN: [id number] Date of Birth: 1956/07/26  Today's Date: 02/18/2019 SLP Individual Time: 7681-1572 SLP Individual Time Calculation (min): 28 min  Short Term Goals: Week 2: SLP Short Term Goal 1 (Week 2): Patient will utilize speech intelligibility strategies at the sentence level with achieve ~90% intelligibility with Min A verbal cues.  SLP Short Term Goal 2 (Week 2): Patient will demonstrate sustained attention to tasks for ~15 minutes with Mod A verbal cues.  SLP Short Term Goal 3 (Week 2): Patient will demonstrate functional problem solving for basic and familiar tasks with Min A verbal cues.  SLP Short Term Goal 4 (Week 2): Patient will attend to left field of enviornment during functional tasks with Min A verbal cues.  SLP Short Term Goal 5 (Week 2): Patient will respond to questions, etc in 75% of opportunities with Max A verbal cues.   Skilled Therapeutic Interventions: Patient received skilled SLP services targeting cognitive-linguistic deficits. Patient participated in functional reading task reading a restaurant menu targeting visual scanning to left field with 80% accuracy and mod verbal cues. SLP facilitated functional writing task for visual scanning to left side of the page with patient writing 5 items that she needs at the store with min verbal cues. Patient benefits from additional time to respond to questions in conversational speech. With additional processing and time to respond patient answered basic -wh questions in 80% of opportunities. Upon completion of therapy session patient upright in chair, chair alarm activated, call bell within reach. Patient would continue to benefit from skilled SLP services.   Pain Pain Assessment Pain Scale: Faces Pain Score: 0-No pain Faces Pain Scale: No hurt  Therapy/Group: Individual Therapy  Arnette Schaumann, MS, CCC-SLP 02/18/2019, 2:05 PM

## 2019-02-18 NOTE — Progress Notes (Signed)
Occupational Therapy Session Note  Patient Details  Name: [name] MRN: [id number] Date of Birth: Jul 20, 1956  Today's Date: 02/18/2019 OT Individual Time: 6438-3818 OT Individual Time Calculation (min): 72 min    Short Term Goals: Week 1:  OT Short Term Goal 1 (Week 1): Pt will perform toilet transfer with max A in order to decrease level of assist with functional transfers. OT Short Term Goal 1 - Progress (Week 1): Not met OT Short Term Goal 2 (Week 1): Pt will maintain dynamic balance during functional tasks seated EOB with min A overall. OT Short Term Goal 2 - Progress (Week 1): Met OT Short Term Goal 3 (Week 1): Pt will perform UB dressing with mod A in order to decrease level of assist with self care. OT Short Term Goal 3 - Progress (Week 1): Not met  Skilled Therapeutic Interventions/Progress Updates:    1:1. Pt received in bed with set up meal tray. Pt finishes meal with reporting HA earlier, however has since subsided. Pt declines bathing/dressing this session, reporting will "do it later." BP 185/94. RN alerted and delivers pain medication. pt supine>sitting EOB with MOD A. Pt dons pants with MAX A EOB. Pt sit to stand at EOB with MOD A overall while OT advances pants past hips. Pt completes stand pivot transfer iwht MAX A for facilitation of pivot with L knee block. Pt completes grooming at sink with VC for L attention to locate items on L and mod VC for sequencing. OT completes PROM of LUE to reduce tone in all planes against flexor synergy ~10 min Pt completes 3 standing trials ranging 30 sec-2 min at high low table with LUE WB on elbow for deep proprioceptive input. After 2nd standing trial pt reporting she didn't want to do any more "because it is too hard." Educated pt on importance ofstanding and strengthening LEs in prep for ADLs. Exited Session with pt seated in w/c, call light in reach, belt alamr on and an all needs met.  Therapy Documentation Precautions:   Precautions Precautions: Fall Restrictions Weight Bearing Restrictions: No General:   Vital Signs: Therapy Vitals Temp: 99.2 F (37.3 C) Temp Source: Oral Pulse Rate: 73 BP: (!) 173/82 Patient Position (if appropriate): Lying Oxygen Therapy SpO2: 100 % Pain:   ADL:   Vision   Perception    Praxis   Exercises:   Other Treatments:     Therapy/Group: Individual Therapy  Tonny Branch 02/18/2019, 8:20 AM

## 2019-02-18 NOTE — Progress Notes (Signed)
Occupational Therapy Session Note  Patient Details  Name: [name] MRN: [id number] Date of Birth: 10/10/1956  Today's Date: 02/18/2019 OT Individual Time: 1230-1326 OT Individual Time Calculation (min): 56 min    Short Term Goals: Week 1:  OT Short Term Goal 1 (Week 1): Pt will perform toilet transfer with max A in order to decrease level of assist with functional transfers. OT Short Term Goal 1 - Progress (Week 1): Not met OT Short Term Goal 2 (Week 1): Pt will maintain dynamic balance during functional tasks seated EOB with min A overall. OT Short Term Goal 2 - Progress (Week 1): Met OT Short Term Goal 3 (Week 1): Pt will perform UB dressing with mod A in order to decrease level of assist with self care. OT Short Term Goal 3 - Progress (Week 1): Not met  Skilled Therapeutic Interventions/Progress Updates:    1:1. Pt received in w/c with no reporting of pain. Pt requesting to eat meal prior to leaving room. OT sets up tray with all items located on L to force L attention with preferred items chips on far L. Had nutrition person stand to pt L and cued pt to look at staff when ordering lunch for L attention. Pt completes bathing and dressing at sink level with MIN A sit to stand in stedy and min A standing balance in stedy (L lean) while pt completes peri bathing and OT completes posterior bathing. Pt able to thread RLE into pants, and OT completes LLE/advancing pant past hips. Pt completes UB bathing with A to wash RUE. Pt able to verbalize dressing LUE first, however requires MOD A overall for donning shirt. Exited session with pt seated in w/c, belt alarm, call light in reach and all needs met  Therapy Documentation Precautions:  Precautions Precautions: Fall Restrictions Weight Bearing Restrictions: No General:   Vital Signs: Therapy Vitals Temp: 98.4 F (36.9 C) Temp Source: Oral Pain:   ADL:   Vision   Perception    Praxis   Exercises:   Other Treatments:      Therapy/Group: Individual Therapy   M  02/18/2019, 12:41 PM 

## 2019-02-18 NOTE — Progress Notes (Signed)
Physical Therapy Session Note  Patient Details  Name: [name] MRN: [id number] Date of Birth: Mar 22, 1956  Today's Date: 02/18/2019 PT Individual Time: 2536-6440 PT Individual Time Calculation (min): 27 min   Short Term Goals: Week 2:  PT Short Term Goal 1 (Week 2): Pt will transfer bed<>chair w/ mod assist consistently PT Short Term Goal 2 (Week 2): Pt will ambulate 30' w/ LRAD, max assist PT Short Term Goal 3 (Week 2): Pt will self-propel w/c 50' w/ min assist PT Short Term Goal 4 (Week 2): Pt will attend to R environment during functional mobility w/ min cues 100% of the time   Skilled Therapeutic Interventions/Progress Updates:   Pt in w/c and agreeable to therapy, denies pain. Session focused on w/c mobility for increased independence w/ locomotion. Self-propelled w/c 150' total over span of 15 minutes. Pt very slow to initiate despite max multimodal cues for technique, coordination of R hemi technique, attention to task, and for functional problem solving. Educated pt on importance of increasing independence w/ task as she will likely not be ambulatory at time of d/c to anticipating setting of home. Returned to room and performed squat/stand pivot transfer to EOB w/ max assist, however less cues needed for technique, weight shifting, and for blocking of L knee. Ended session in supine, all needs in reach.   Therapy Documentation Precautions:  Precautions Precautions: Fall Restrictions Weight Bearing Restrictions: No Vital Signs: Therapy Vitals Temp: 98.8 F (37.1 C) Temp Source: Oral Pulse Rate: 88 Resp: 16 BP: 137/88 Patient Position (if appropriate): Lying Oxygen Therapy SpO2: 100 % O2 Device: Room Air  Therapy/Group: Individual Therapy  Ruhaan Nordahl K Jalaine Riggenbach 02/18/2019, 6:01 PM

## 2019-02-18 NOTE — Progress Notes (Signed)
Physical Therapy Session Note  Patient Details  Name: [name] MRN: [id number] Date of Birth: Oct 13, 1956  Today's Date: 02/18/2019 PT Individual Time: 1000-1020 PT Individual Time Calculation (min): 20 min   Short Term Goals: Week 2:  PT Short Term Goal 1 (Week 2): Pt will transfer bed<>chair w/ mod assist consistently PT Short Term Goal 2 (Week 2): Pt will ambulate 30' w/ LRAD, max assist PT Short Term Goal 3 (Week 2): Pt will self-propel w/c 50' w/ min assist PT Short Term Goal 4 (Week 2): Pt will attend to R environment during functional mobility w/ min cues 100% of the time   Skilled Therapeutic Interventions/Progress Updates:    Patient received asleep in [facility], easily woken but requiring max verbal and tactile cues and encouragement for participation in therapy today. Attempted sit to stand with totalA at sink, patient able to get part of the way up and then sat back down, refused to attempt again. Moved WC next to foot of bed so footboard could be used as a railing, patient able to perform full stand with MaxA and facilitation at L quad as well as max cues for initiation, however quickly sat back down and refused to attempt more activity, stating "I'm not feeling this". Stretched L heel cord 5x30 seconds and performed ROM/attempted NMR and facilitation L LE however patient not participatory, falling asleep in WC. She was left up in [facility] with all needs met, seat belt alarm active this morning. 10 minutes of skilled therapy time missed due to patient refusal to further participate.   Therapy Documentation Precautions:  Precautions Precautions: Fall Restrictions Weight Bearing Restrictions: No General: PT Amount of Missed Time (min): 10 Minutes PT Missed Treatment Reason: Patient unwilling to participate Pain: Pain Assessment Pain Scale: Faces Pain Score: 0-No pain Faces Pain Scale: No hurt    Therapy/Group: Individual Therapy   Deniece Ree PT, DPT, CBIS  Supplemental  Physical Therapist [facility]    Pager [phone] Acute Rehab Office [phone]    02/18/2019, 12:47 PM

## 2019-02-18 NOTE — Progress Notes (Signed)
[place] PHYSICAL MEDICINE & REHABILITATION PROGRESS NOTE   Subjective/Complaints: Patient seen sitting up in bed this morning eating breakfast, working with SLP.  She states she slept well overnight.  She states she did not wake up with a headache this morning.  ROS: Denies CP, shortness of breath, nausea, vomiting, diarrhea.  Objective:   Dg Chest 2 View  Result Date: 02/16/2019 CLINICAL DATA:  Fever. EXAM: CHEST - 2 VIEW COMPARISON:  09/16/2008 and 09/05/2008 FINDINGS: Lungs are adequately inflated without focal airspace consolidation or effusion. Mild stable cardiomegaly. Remainder of the exam is unchanged. IMPRESSION: No active cardiopulmonary disease. Electronically Signed   By: Elberta Fortis M.D.   On: 02/16/2019 11:55   No results for input(s): WBC, HGB, HCT, PLT in the last 72 hours. Recent Labs    02/16/19 0915  NA 134*  K 4.2  CL 101  CO2 23  GLUCOSE 147*  BUN 14  CREATININE 0.75  CALCIUM 8.6*    Intake/Output Summary (Last 24 hours) at 02/18/2019 0925 Last data filed at 02/17/2019 1815 Gross per 24 hour  Intake 666 ml  Output -  Net 666 ml     Physical Exam: Vital Signs Blood pressure (!) 173/82, pulse 73, temperature 99.2 F (37.3 C), temperature source Oral, resp. rate 14, weight (P) 61.2 kg, SpO2 100 %. Constitutional: NAD.  Vital signs reviewed. HENT: Normocephalic.  Atraumatic.  Poor dentition. Eyes: EOMI.  No discharge. Cardiovascular: No JVD. Respiratory: Normal effort. GI: Non--distended. Musc: No edema or tenderness in extremities. Neurologic: Alert and oriented, except for date of month Facial weakness, unchanged Dysarthria, unchanged Motor:  0/5 LUE, stable 2/5 hip flexion, distally 0/5 LLE, unchanged Increased tone LUE/LLE, unchanged Skin: Warm and dry.  Intact. Psych: Slowed  Assessment/Plan: 1. Functional deficits secondary to RIght Basal ganglia infarct which require 3+ hours per day of interdisciplinary therapy in a comprehensive  inpatient rehab setting.  Physiatrist is providing close team supervision and 24 hour management of active medical problems listed below.  Physiatrist and rehab team continue to assess barriers to discharge/monitor patient progress toward functional and medical goals  Care Tool:  Bathing    Body parts bathed by patient: Chest, Front perineal area, Abdomen, Right upper leg, Left upper leg   Body parts bathed by helper: Buttocks, Left lower leg, Right lower leg     Bathing assist Assist Level: Maximal Assistance - Patient 24 - 49%     Upper Body Dressing/Undressing Upper body dressing Upper body dressing/undressing activity did not occur (including orthotics): Refused What is the patient wearing?: Pull over shirt    Upper body assist Assist Level: Maximal Assistance - Patient 25 - 49%    Lower Body Dressing/Undressing Lower body dressing      What is the patient wearing?: Incontinence brief, Pants     Lower body assist Assist for lower body dressing: Total Assistance - Patient < 25%     Toileting Toileting    Toileting assist Assist for toileting: Maximal Assistance - Patient 25 - 49%     Transfers Chair/bed transfer  Transfers assist     Chair/bed transfer assist level: Maximal Assistance - Patient 25 - 49%     Locomotion Ambulation   Ambulation assist   Ambulation activity did not occur: Safety/medical concerns  Assist level: 2 helpers Assistive device: Walker-hemi Max distance: 15'   Walk 10 feet activity   Assist  Walk 10 feet activity did not occur: Safety/medical concerns  Assist level: 2 helpers Assistive device:  Walker-hemi   Walk 50 feet activity   Assist Walk 50 feet with 2 turns activity did not occur: Safety/medical concerns         Walk 150 feet activity   Assist Walk 150 feet activity did not occur: Safety/medical concerns         Walk 10 feet on uneven surface  activity   Assist Walk 10 feet on uneven surfaces  activity did not occur: Safety/medical concerns         Wheelchair     Assist Will patient use wheelchair at discharge?: Yes Type of Wheelchair: Manual Wheelchair activity did not occur: Refused  Wheelchair assist level: Maximal Assistance - Patient 25 - 49% Max wheelchair distance: 63'    Wheelchair 50 feet with 2 turns activity    Assist    Wheelchair 50 feet with 2 turns activity did not occur: Refused   Assist Level: Maximal Assistance - Patient 25 - 49%   Wheelchair 150 feet activity     Assist Wheelchair 150 feet activity did not occur: Safety/medical concerns        Medical Problem List and Plan: 1.Left side weaknesssecondary to multiple right brain infarcts adjacent to previous right MCA infarcts on 02/04/2019 in the setting of 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 Follow-up outpatient interventional radiology for ICA aneurysm  Continue CIR  WHO, PRAFO ordered for left side for spasticity/contracture mgt  Repeat head CT 4/1 reviewed, showing evolving infarct as well as chronic infarct. 2. Antithrombotics: -DVT/anticoagulation:Subcutaneous Lovenox -antiplatelet therapy: Aspirin 325 mg daily 3. Pain Management:Lyrica 75 mg daily, Tylenol as needed  Topamax started on 3/31, DC'd on 4/2 due to Lethargy-improving 4. Mood/bipolar disorder -antipsychotic agents: Risperdal 1 mg daily at bedtime 5. Neuropsych: This patientiscapable of making decisions on herown behalf. 6. Skin/Wound Care:Routine skin checks 7. Fluids/Electrolytes/Nutrition:Routine in and out's  8. Hypertension. Monitor with increased mobility. Patient on Norvasc 5 mg daily, Coreg 12.5 mg twice a day prior to admission. Resume as needed Vitals:   02/17/19 1916 02/18/19 0527  BP: (!) 146/77 (!) 173/82  Pulse: 81 73  Resp: 14   Temp: 99 F (37.2 C) 99.2 F (37.3 C)  SpO2: 100% 100%   Norvasc 2.5  started on 3/31, increased to 5 on 4/2  Improving control overall, however elevated this morning, continue to monitor for trend 9. Diastolic congestive heart failure. Monitor for any signs of fluid overload Filed Weights   02/16/19 0551 02/17/19 0501 02/18/19 0707  Weight: 63.8 kg 65.4 kg (P) 61.2 kg   Stable on 4/8 10. History oftobacco/cocaine use. Provide counseling 11. Hyperlipidemia. Lipitor 12.  Constipation: Improving 13.  Spasticity hemiparesis  Tizanidine 2 mg twice daily started on 4/3, increased to 4 mg on 4/5, increased to 6 mg on 4/7  Baclofen 5 3 times daily started on 3/31, DC'd on 4/2 due to Lethargy  Appears to be tolerating, will consider further increase tomorrow  Will likely require Botox injection for better spasticity control 14.  Prediabetes  Blood glucose elevated on 4/6  Continue to monitor, will consider changing to carb modified diet if persistently elevated 15.  Hypokalemia  Potassium 4.2 on 4/6 16.  Hypoalbuminemia  Supplement initiated on 3/30 17.  Leukopenia: Resolved  WBCs 4.0 on 4/2  Continue to monitor 18.  Thrombocytopenia: Resolved  Platelets 173 on 4/2  Continue to monitor 19.  Hyponatremia  Sodium 134 on 4/6  Continue to monitor 20.  Fever:?  Resolved  Blood cultures NGTD on 4/8  UA negative, urine culture no growth  Chest x-ray reviewed, unremarkable for acute process  LOS: 12 days A FACE TO FACE EVALUATION WAS PERFORMED  Branda Chaudhary Karis JubaAnil Dilcia Rybarczyk 02/18/2019, 9:25 AM

## 2019-02-18 NOTE — Patient Care Conference (Signed)
Inpatient RehabilitationTeam Conference and Plan of Care Update Date: 02/18/2019   Time: 2:30 PM    Patient Name: [name]      Medical Record Number: [id number]  Date of Birth: [birth year] Sex: Female         Room/Bed: 4M10C/4M10C-01 Payor Info: Payor: [employer]vertising [employer] MEDICARE / Plan: [facility] MEDICARE / Product Type: *No Product type* /    Admitting Diagnosis: CVA  Admit Date/Time:  02/06/2019  5:35 PM Admission Comments: No comment available   Primary Diagnosis:  <principal problem not specified> Principal Problem: <principal problem not specified>  Patient Active Problem List   Diagnosis Date Noted  . FUO (fever of unknown origin)   . Hyponatremia   . Spastic hemiparesis (HCC)   . Vascular headache   . Hypertensive crisis   . Thrombocytopenia (HCC)   . Leukopenia   . Hypoalbuminemia due to protein-calorie malnutrition (HCC)   . Hypokalemia   . Prediabetes   . Spastic hemiplegia affecting nondominant side (HCC)   . Chronic diastolic congestive heart failure (HCC)   . Substance abuse (HCC)   . Essential hypertension   . Labile blood pressure   . Aneurysm, cerebral, nonruptured 02/06/2019  . Acute right MCA stroke (HCC) 02/06/2019  . 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 Right carotid artery occlusion 02/05/2019  . Left hemiparesis (HCC) 02/05/2019  . CVA (cerebral vascular accident) (HCC) 02/03/2019    Expected Discharge Date: Expected Discharge Date: 02/27/19  Team Members Present: Physician leading conference: Dr. Maryla Morrow Social Worker Present: Amada Jupiter, LCSW Nurse Present: Vincente Poli, RN PT Present: Carlynn Purl, PT OT Present: Blanch Media, OT SLP Present: Feliberto Gottron, SLP PPS Coordinator present : Fae Pippin     Current Status/Progress Goal Weekly Team Focus  Medical   Left side weakness secondary to multiple right brain infarcts adjacent to previous right MCA infarcts on 02/04/2019 in the setting of right ICA acute on chronic  occlusion/left ICA cavernous aneurysm and current cocaine use. Follow-up outpatient interventional radiology for ICA aneurysm  Improve HTN/DM, spasticity  See above   Bowel/Bladder   Incontinent of Bladder/bowel, LBM 02/16/19  timed toileting   Assess, QS and prn, Mod assistance with i  Swallow/Nutrition/ Hydration             ADL's   Max A overall for BADLs, can sit<>stand with as little as min A but has difficulty achieving full extension in standing  Min A  L NMR, self-care retraining, activity tolerance, transfers, pt/family education, attention, initiation   Mobility   max assist bed mobility, transfers, and gait; min-mod assist sit<>stands; gait 5-15' at a rail or hemiwalker w/ w/c follow, limited participation/motivation   min assist (will likely downgrade some goals at next weekly note)  postural control/balance, LLE NMR, all functional mobility    Communication   Min A  Supervision  Use of speech intelligiblity strategies in conversational speech   Safety/Cognition/ Behavioral Observations  Min A  Supervision  Functional problem solving for the home environment, compensatory memory strategies, Visual Scanning   Pain   Continue to c/o headache 8/10, Tylenol prn resolves pain  to monitor pain and offer prn pain medication with a goal of 2 out 10  Asess QS and prn, record the effectiveness of meds, notify Medical team if no relied   Skin   Intact, no clinical issue, ecchymotic areas resolving   to remain intact  QS and prn assessment    Rehab Goals Patient on target to meet rehab goals: No Rehab  Goals Revised: pt making very limited progress *See Care Plan and progress notes for long and short-term goals.     Barriers to Discharge  Current Status/Progress Possible Resolutions Date Resolved   Physician    Medical stability     See above  Therapies, optimize DM/HTN meds, optimize spasticity meds      Nursing                  PT                    OT                  SLP                SW                Discharge Planning/Teaching Needs:  Plan to return home with boyfriend and his two sisters PLUS a CAP aide who can, all together, provide 24/7 supervision/ assist.  Teaching to be planned closer to d/c.   Team Discussion:  Tone continues to be a limiting facotr.  May need botox as OP.  incont b/b.  Poor participation overall.  Max assist with gait and hemi-walker.  Max with ADLs with intermittent min assist.  Very short attention span, decreased motivation and making very limited progress. Team anticipates downgrading some goals.  SW to follow up that needed LOC can be provided by pt's bf and CAP aide.    Revisions to Treatment Plan:  Anticipate several goals to be downgraded.    Continued Need for Acute Rehabilitation Level of Care: The patient requires daily medical management by a physician with specialized training in physical medicine and rehabilitation for the following conditions: Daily direction of a multidisciplinary physical rehabilitation program to ensure safe treatment while eliciting the highest outcome that is of practical value to the patient.: Yes Daily medical management of patient stability for increased activity during participation in an intensive rehabilitation regime.: Yes Daily analysis of laboratory values and/or radiology reports with any subsequent need for medication adjustment of medical intervention for : Blood pressure problems;Other;Neurological problems   I attest that I  was present, lead the team conference, and concur with the assessment and  plan of the team.   Amada JupiterHOYLE, Peityn Payton 02/18/2019, 3:39 PM   Team conference was held via web/ teleconference due to COVID - 19

## 2019-02-19 ENCOUNTER — Inpatient Hospital Stay (HOSPITAL_COMMUNITY): Payer: Medicare Other | Admitting: Speech Pathology

## 2019-02-19 ENCOUNTER — Inpatient Hospital Stay (HOSPITAL_COMMUNITY): Payer: Medicare Other | Admitting: Physical Therapy

## 2019-02-19 ENCOUNTER — Inpatient Hospital Stay (HOSPITAL_COMMUNITY): Payer: Medicare Other | Admitting: Occupational Therapy

## 2019-02-19 NOTE — Progress Notes (Signed)
[place] PHYSICAL MEDICINE & REHABILITATION PROGRESS NOTE   Subjective/Complaints: Patient seen laying in bed this morning.  She states she slept well overnight.  She tells nurse that headache improved with medication.  ROS: Denies CP, shortness of breath, nausea, vomiting, diarrhea.  Objective:   No results found. No results for input(s): WBC, HGB, HCT, PLT in the last 72 hours. No results for input(s): NA, K, CL, CO2, GLUCOSE, BUN, CREATININE, CALCIUM in the last 72 hours.  Intake/Output Summary (Last 24 hours) at 02/19/2019 1120 Last data filed at 02/19/2019 0730 Gross per 24 hour  Intake 716 ml  Output -  Net 716 ml     Physical Exam: Vital Signs Blood pressure (!) 148/91, pulse 75, temperature 99 F (37.2 C), temperature source Oral, resp. rate 14, weight 63.2 kg, SpO2 98 %. Constitutional: NAD.  Vital signs reviewed. HENT: Normocephalic.  Atraumatic.  Poor dentition. Eyes: EOMI.  No discharge. Cardiovascular: No JVD. Respiratory: Normal effort. GI: Non--distended. Musc: No edema or tenderness in extremities. Neurologic: Alert Facial weakness, stable Dysarthria, stable Motor:  0/5 LUE, stable 2/5 hip flexion, distally 0/5 LLE, stable Increased tone LUE/LLE, slightly improved Skin: Warm and dry.  Intact. Psych: Slowed  Assessment/Plan: 1. Functional deficits secondary to RIght Basal ganglia infarct which require 3+ hours per day of interdisciplinary therapy in a comprehensive inpatient rehab setting.  Physiatrist is providing close team supervision and 24 hour management of active medical problems listed below.  Physiatrist and rehab team continue to assess barriers to discharge/monitor patient progress toward functional and medical goals  Care Tool:  Bathing    Body parts bathed by patient: Chest, Front perineal area, Abdomen, Right upper leg, Left upper leg, Left arm   Body parts bathed by helper: Buttocks, Left lower leg, Right lower leg     Bathing  assist Assist Level: Maximal Assistance - Patient 24 - 49%     Upper Body Dressing/Undressing Upper body dressing Upper body dressing/undressing activity did not occur (including orthotics): Refused What is the patient wearing?: Pull over shirt    Upper body assist Assist Level: Moderate Assistance - Patient 50 - 74%    Lower Body Dressing/Undressing Lower body dressing      What is the patient wearing?: Incontinence brief, Pants     Lower body assist Assist for lower body dressing: Total Assistance - Patient < 25%     Toileting Toileting    Toileting assist Assist for toileting: Maximal Assistance - Patient 25 - 49%     Transfers Chair/bed transfer  Transfers assist     Chair/bed transfer assist level: Maximal Assistance - Patient 25 - 49%     Locomotion Ambulation   Ambulation assist   Ambulation activity did not occur: Safety/medical concerns  Assist level: 2 helpers Assistive device: Walker-hemi Max distance: 15'   Walk 10 feet activity   Assist  Walk 10 feet activity did not occur: Safety/medical concerns  Assist level: 2 helpers Assistive device: Walker-hemi   Walk 50 feet activity   Assist Walk 50 feet with 2 turns activity did not occur: Safety/medical concerns         Walk 150 feet activity   Assist Walk 150 feet activity did not occur: Safety/medical concerns         Walk 10 feet on uneven surface  activity   Assist Walk 10 feet on uneven surfaces activity did not occur: Safety/medical concerns         Wheelchair     Assist  Will patient use wheelchair at discharge?: Yes Type of Wheelchair: Manual Wheelchair activity did not occur: Refused  Wheelchair assist level: Maximal Assistance - Patient 25 - 49% Max wheelchair distance: 57'    Wheelchair 50 feet with 2 turns activity    Assist    Wheelchair 50 feet with 2 turns activity did not occur: Refused   Assist Level: Maximal Assistance - Patient 25 - 49%    Wheelchair 150 feet activity     Assist Wheelchair 150 feet activity did not occur: Safety/medical concerns        Medical Problem List and Plan: 1.Left side weaknesssecondary to multiple right brain infarcts adjacent to previous right MCA infarcts on 02/04/2019 in the setting of 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 Follow-up outpatient interventional radiology for ICA aneurysm  Continue CIR  WHO, PRAFO ordered for left side for spasticity/contracture mgt  Repeat head CT 4/1 reviewed, showing evolving infarct as well as chronic infarct. 2. Antithrombotics: -DVT/anticoagulation:Subcutaneous Lovenox -antiplatelet therapy: Aspirin 325 mg daily 3. Pain Management:Lyrica 75 mg daily, Tylenol as needed  Topamax for headache started on 3/31, DC'd on 4/2 due to Lethargy-improving 4. Mood/bipolar disorder -antipsychotic agents: Risperdal 1 mg daily at bedtime 5. Neuropsych: This patientiscapable of making decisions on herown behalf. 6. Skin/Wound Care:Routine skin checks 7. Fluids/Electrolytes/Nutrition:Routine in and out's  8. Hypertension. Monitor with increased mobility. Patient on Norvasc 5 mg daily, Coreg 12.5 mg twice a day prior to admission. Resume as needed Vitals:   02/18/19 1904 02/19/19 0553  BP: 139/77 (!) 148/91  Pulse: 79 75  Resp: 14   Temp: 98.4 F (36.9 C) 99 F (37.2 C)  SpO2: 100% 98%   Norvasc 2.5 started on 3/31, increased to 5 on 4/2  Elevated, but improving on 4/9 9. Diastolic congestive heart failure. Monitor for any signs of fluid overload Filed Weights   02/17/19 0501 02/18/19 0707 02/19/19 0553  Weight: 65.4 kg 61.2 kg 63.2 kg   Stable on 4/9 10. History oftobacco/cocaine use. Provide counseling 11. Hyperlipidemia. Lipitor 12.  Constipation: Improving 13.  Spasticity hemiparesis  Tizanidine 2 mg twice daily started on 4/3, increased to 4 mg on 4/5, increased  to 6 mg on 4/7  Baclofen 5 3 times daily started on 3/31, DC'd on 4/2 due to Lethargy  Appears to be tolerating, will consider further increase tomorrow  Will likely require Botox injection for better spasticity control 14.  Prediabetes  Blood glucose elevated on 4/6  Continue to monitor, will consider changing to carb modified diet if persistently elevated 15.  Hypokalemia  Potassium 4.2 on 4/6  Labs ordered for tomorrow 16.  Hypoalbuminemia  Supplement initiated on 3/30 17.  Leukopenia: Resolved  WBCs 4.0 on 4/2  Continue to monitor 18.  Thrombocytopenia: Resolved  Platelets 173 on 4/2  Continue to monitor 19.  Hyponatremia  Sodium 134 on 4/6  Labs ordered for tomorrow  Continue to monitor 20.  Fever:?  Resolved  Blood cultures NGTD on 4/9  UA negative, urine culture no growth  Chest x-ray reviewed, unremarkable for acute process  LOS: 13 days A FACE TO FACE EVALUATION WAS PERFORMED  Desmon Hitchner Karis Juba 02/19/2019, 11:20 AM

## 2019-02-19 NOTE — Progress Notes (Signed)
Physical Therapy Session Note  Patient Details  Name: [name] MRN: [id number] Date of Birth: Jan 23, 1956  Today's Date: 02/19/2019 PT Individual Time: 0900-1010 PT Individual Time Calculation (min): 70 min   Short Term Goals: Week 2:  PT Short Term Goal 1 (Week 2): Pt will transfer bed<>chair w/ mod assist consistently PT Short Term Goal 2 (Week 2): Pt will ambulate 30' w/ LRAD, max assist PT Short Term Goal 3 (Week 2): Pt will self-propel w/c 50' w/ min assist PT Short Term Goal 4 (Week 2): Pt will attend to R environment during functional mobility w/ min cues 100% of the time   Skilled Therapeutic Interventions/Progress Updates:    Patient received up in [facility], willing to participate with therapy this morning. Began session with practice of [place] navigation with hemi-technique, took almost 30 minutes to navigate 230f with ModA to reduce L drift and improve straight line WC navigation, multiple rest breaks due to fatigue and poor motivation this morning. From there performed multiple sit to stands at railing in day room with assist ranging from MinA to [facility] to full standing; once up patient demonstrated heavy R lean as well as flexed posture causing anterior LOB and requiring Max cues to correct repeatedly. Used visual feedback with little change in posture, patient limited by fatigue especially during latter half of standing activity. Heavy cues provided during standing for upright posture, as well as reduction of anterior LOB and R lean but cues less and less effective as fatigue increased. Deferred gait training due to poor posture and standing balance today. [facility] finished session with thorough stretching of L heel cord. She was left up in her WC with seat belt alarm active, all needs otherwise met this morning.   Therapy Documentation Precautions:  Precautions Precautions: Fall Restrictions Weight Bearing Restrictions: No General:    Vital Signs:  Pain: Pain Assessment Pain Scale: Faces Pain Score: 0-No pain Faces Pain Scale: No hurt    Therapy/Group: Individual Therapy   KDeniece ReePT, DPT, CBIS  Supplemental Physical [facility]   Pager [phone] Rehab Office [phone]   02/19/2019, 12:02 PM

## 2019-02-19 NOTE — Progress Notes (Signed)
Physical Therapy Session Note  Patient Details  Name: [name] MRN: [id number] Date of Birth: Jan 20, 1956  Today's Date: 02/19/2019 PT Individual Time: 1400-1435 PT Individual Time Calculation (min): 35 min   Short Term Goals: Week 2:  PT Short Term Goal 1 (Week 2): Pt will transfer bed<>chair w/ mod assist consistently PT Short Term Goal 2 (Week 2): Pt will ambulate 30' w/ LRAD, max assist PT Short Term Goal 3 (Week 2): Pt will self-propel w/c 50' w/ min assist PT Short Term Goal 4 (Week 2): Pt will attend to R environment during functional mobility w/ min cues 100% of the time   Skilled Therapeutic Interventions/Progress Updates:   Pt in w/c and agreeable to therapy, denies pain. Total assist w/c transport to/from therapy gym. Worked on Investment banker, operational and NMR w/ hemiwalker this session. Multiple sit<>stands to hemiwalker w/ mod-max assist w/ manual and verbal cues for increased R weight shifting during transitional movement. Ambulated 10' w/ hemiwalker and w/c follow. Max assist for LLE placement, lateral weight shifting, and L hip extension in single leg stance. Improved automatic L quad activation, however continues to require mod-max assist to overcome L hamstring tone to reach full L knee extension in single leg stance. Returned to room via w/c and performed stand/squat pivot to EOB w/ max assist w/ increased time 2/2 poor processing/initiation. Performed manual LLE stretching along w/ rhythmic initiation in supine for tone management. L adductor stretch in figure 4, 30 sec hold at end range x5. L hamstring stretch, 30 sec hold x3 reps. Ended session in supine, all needs in reach.   Therapy Documentation Precautions:  Precautions Precautions: Fall Restrictions Weight Bearing Restrictions: No Vital Signs: Therapy Vitals Temp: 98.4 F (36.9 C) Temp Source: Oral Pulse Rate: 78 BP: 125/81 Patient Position (if appropriate): Sitting Oxygen Therapy SpO2: 100 % Pain: Pain  Assessment Pain Scale: Faces Pain Score: 0-No pain Faces Pain Scale: No hurt  Therapy/Group: Individual Therapy  Aarthi Uyeno Melton Krebs 02/19/2019, 2:37 PM

## 2019-02-19 NOTE — Progress Notes (Signed)
Occupational Therapy Weekly Progress Note  Patient Details  Name: [name] MRN: [id number] Date of Birth: [birth year]  Beginning of progress report period: February 07, 2019 End of progress report period: February 19, 2019  Today's Date: 02/19/2019 OT Individual Time: 5929-2446 OT Individual Time Calculation (min): 55 min    Patient has met 2 of 3 short term goals.  Pt is making slow progress towards OT goals. She has participated more this week, but continues to need Max A for BADL tasks. Pt has progressed with sit<>stands at a more consistent mod A, but has difficulty pivoting 2/2 L hemiplegia and increased tone with weight bearing. Plan to downgrade goals to an overall Mod A level.   Patient continues to demonstrate the following deficits: muscle weakness and muscle joint tightness, impaired timing and sequencing, abnormal tone, unbalanced muscle activation, decreased coordination and decreased motor planning, decreased midline orientation, decreased attention to left and left side neglect, decreased initiation, decreased attention, decreased awareness, decreased problem solving, decreased safety awareness, decreased memory and delayed processing and decreased sitting balance, decreased standing balance, decreased postural control, hemiplegia, decreased balance strategies and therefore will continue to benefit from skilled OT intervention to enhance overall performance with BADL.  Patient not progressing toward long term goals.  See goal revision..  Plan of care revisions: Goals downgraded to an overall mod A.  OT Short Term Goals Week 2:  OT Short Term Goal 1 (Week 2): Pt will perform UB dressing with mod A in order to decrease level of assist with self care. OT Short Term Goal 1 - Progress (Week 2): Progressing toward goal OT Short Term Goal 2 (Week 2): Pt will perform toilet transfer with max A in order to decrease level of assist with functional transfers. OT Short Term Goal 2 - Progress  (Week 2): Met OT Short Term Goal 3 (Week 2): Pt will complete sit<>stand with max A of 1 person without Stedy in preparation for BADL tasks OT Short Term Goal 3 - Progress (Week 2): Met Week 3:  OT Short Term Goal 1 (Week 3): STG=LTG 2/2 ELOS  Skilled Therapeutic Interventions/Progress Updates:    Pt greeted semi reclined in bed, finishing breakfast, and agreeable to OT treatment session. OT asked pt if she needed to go to the bathroom, she stated no, but agreeable to try anyway. Pt needed mod A for bed mobility to advance hemiparetic side. Sit<>stand in stedy with min A, then transferred pt over commode in stedy. Pt needed assistance for clothing management and had already been incontinent of urine in brief. Pt sat on commode but was unable to go to the bathroom. Worked on standing balance/endurance from stedy to wash LB with pt able to maintain standing and wash peri-area while OT provided min A for balance. Practiced multiple sit<>stands without stedy using hemi walker. Pt able to stand with mod A, but had L knee buckle on 2 instances requiring max A and knee block to correct. Pt left seated in wc at end of session with safety belt on and needs met.   Therapy Documentation Precautions:  Precautions Precautions: Fall Restrictions Weight Bearing Restrictions: No Pain: None denies pain  Therapy/Group: Individual Therapy  Valma Cava 02/19/2019, 8:30 AM

## 2019-02-19 NOTE — Plan of Care (Signed)
Goals downgraded 4/9 -ESD

## 2019-02-19 NOTE — Progress Notes (Signed)
Speech Language Pathology Weekly Progress and Session Note  Patient Details  Name: [name] MRN: [id number] Date of Birth: [birth year]  Beginning of progress report period: February 12, 2019 End of progress report period: February 19, 2019  Today's Date: 02/19/2019 SLP Individual Time: 1130-1155 SLP Individual Time Calculation (min): 25 min  Short Term Goals: Week 2: SLP Short Term Goal 1 (Week 2): Patient will utilize speech intelligibility strategies at the sentence level with achieve ~90% intelligibility with Min A verbal cues.  SLP Short Term Goal 1 - Progress (Week 2): Met SLP Short Term Goal 2 (Week 2): Patient will demonstrate sustained attention to tasks for ~15 minutes with Mod A verbal cues.  SLP Short Term Goal 2 - Progress (Week 2): Not met SLP Short Term Goal 3 (Week 2): Patient will demonstrate functional problem solving for basic and familiar tasks with Min A verbal cues.  SLP Short Term Goal 3 - Progress (Week 2): Not met SLP Short Term Goal 4 (Week 2): Patient will attend to left field of enviornment during functional tasks with Min A verbal cues.  SLP Short Term Goal 4 - Progress (Week 2): Not met SLP Short Term Goal 5 (Week 2): Patient will respond to questions, etc in 75% of opportunities with Max A verbal cues.  SLP Short Term Goal 5 - Progress (Week 2): Met    New Short Term Goals: Week 3: SLP Short Term Goal 1 (Week 3): Patient will utilize speech intelligibility strategies at the sentence level with achieve ~90% intelligibility with Supervision verbal cues.  SLP Short Term Goal 2 (Week 3): Patient will demonstrate sustained attention to tasks for ~15 minutes with Mod A verbal cues.  SLP Short Term Goal 3 (Week 3): Patient will demonstrate functional problem solving for basic and familiar tasks with Mod A verbal cues.  SLP Short Term Goal 4 (Week 3): Patient will attend to left field of enviornment during functional tasks with Mod A verbal cues.  SLP Short Term Goal 5  (Week 3): Patient will respond to questions, etc in 75% of opportunities with Mod A verbal cues.   Weekly Progress Updates: Patient has made slow and functional gains and has met 2 of 5 STGs this reporting period. Currently, patient demonstrates overall improved initiation with verbal responses and demonstrates improved speech intelligibility at the phrase and sentence level with overall Min A verbal cues needed for use of compensatory strategies. Patient continues to demonstrate intermittent lethargy and requires overall Max A verbal cues for sustained attention, functional problem solving, initiation and attention to left field of environment. Patient would benefit from continued skilled SLP intervention to maximize her cognitive functioning prior to discharge.       Intensity: Minumum of 1-2 x/day, 30 to 90 minutes Frequency: 3 to 5 out of 7 days Duration/Length of Stay: 02/27/19 Treatment/Interventions: Cueing hierarchy;Functional tasks;Patient/family education;Therapeutic Activities;Cognitive remediation/compensation;Environmental controls;Internal/external aids;Speech/Language facilitation   Daily Session  Skilled Therapeutic Interventions:  Skilled treatment session focused on cognitive goals. Upon arrival, patient was asleep in the wheelchair and required Mod-Max A verbal cues for arousal. SLP facilitated session by providing Max A verbal cues for sustained attention for ~2 minute intervals, initiation and problem solving during a basic money management task. Patient also demonstrated minimal verbal expression this session. Patient left upright in wheelchair with alarm on and all needs within reach. Continue with current plan of care.      Pain Pain Assessment Pain Scale: Faces Pain Score: 0-No pain Faces Pain Scale:  No hurt  Therapy/Group: Individual Therapy  Mckinnley Smithey 02/19/2019, 3:27 PM

## 2019-02-20 ENCOUNTER — Inpatient Hospital Stay (HOSPITAL_COMMUNITY): Payer: Medicare Other | Admitting: Physical Therapy

## 2019-02-20 ENCOUNTER — Inpatient Hospital Stay (HOSPITAL_COMMUNITY): Payer: Medicare Other | Admitting: Occupational Therapy

## 2019-02-20 ENCOUNTER — Inpatient Hospital Stay (HOSPITAL_COMMUNITY): Payer: Medicare Other | Admitting: Speech Pathology

## 2019-02-20 MED ORDER — TIZANIDINE HCL 2 MG PO TABS
8.0000 mg | ORAL_TABLET | Freq: Two times a day (BID) | ORAL | Status: DC
  Administered 2019-02-20 – 2019-03-02 (×19): 8 mg via ORAL
  Filled 2019-02-20 (×19): qty 4

## 2019-02-20 NOTE — Progress Notes (Signed)
[place] PHYSICAL MEDICINE & REHABILITATION PROGRESS NOTE   Subjective/Complaints: Patient seen working with therapy this morning.  He states he slept well overnight.  She notes improvement in headache.  Per therapies patient with improvement in lower extremity tone, however upper extremities remain persistent.  ROS: Denies CP, shortness of breath, nausea, vomiting, diarrhea.  Objective:   No results found. No results for input(s): WBC, HGB, HCT, PLT in the last 72 hours. No results for input(s): NA, K, CL, CO2, GLUCOSE, BUN, CREATININE, CALCIUM in the last 72 hours.  Intake/Output Summary (Last 24 hours) at 02/20/2019 1055 Last data filed at 02/20/2019 0800 Gross per 24 hour  Intake 480 ml  Output -  Net 480 ml     Physical Exam: Vital Signs Blood pressure (!) 139/99, pulse 77, temperature 99.4 F (37.4 C), temperature source Oral, resp. rate 16, weight 63 kg, SpO2 97 %. Constitutional: NAD.  Vital signs reviewed. HENT: Normocephalic.  Atraumatic.  Poor dentition. Eyes: EOMI.  No discharge. Cardiovascular: No JVD. Respiratory: Normal effort. GI: Non--distended. Musc: No edema or tenderness in extremities. Neurologic: Alert Facial weakness, unchanged Dysarthria, unchanged Motor:  0/5 LUE, stable 2/5 hip flexion, distally 0/5 LLE, stable Increased tone LUE/LLE, slightly improved Skin: Warm and dry.  Intact. Psych: Slowed  Assessment/Plan: 1. Functional deficits secondary to RIght Basal ganglia infarct which require 3+ hours per day of interdisciplinary therapy in a comprehensive inpatient rehab setting.  Physiatrist is providing close team supervision and 24 hour management of active medical problems listed below.  Physiatrist and rehab team continue to assess barriers to discharge/monitor patient progress toward functional and medical goals  Care Tool:  Bathing    Body parts bathed by patient: Chest, Front perineal area, Abdomen, Right upper leg, Left upper leg,  Left arm   Body parts bathed by helper: Buttocks, Left lower leg, Right lower leg     Bathing assist Assist Level: Maximal Assistance - Patient 24 - 49%     Upper Body Dressing/Undressing Upper body dressing Upper body dressing/undressing activity did not occur (including orthotics): Refused What is the patient wearing?: Pull over shirt    Upper body assist Assist Level: Moderate Assistance - Patient 50 - 74%    Lower Body Dressing/Undressing Lower body dressing      What is the patient wearing?: Incontinence brief, Pants     Lower body assist Assist for lower body dressing: Total Assistance - Patient < 25%     Toileting Toileting    Toileting assist Assist for toileting: Maximal Assistance - Patient 25 - 49%     Transfers Chair/bed transfer  Transfers assist     Chair/bed transfer assist level: Maximal Assistance - Patient 25 - 49%     Locomotion Ambulation   Ambulation assist   Ambulation activity did not occur: Safety/medical concerns  Assist level: Maximal Assistance - Patient 25 - 49% Assistive device: Walker-hemi Max distance: 10'   Walk 10 feet activity   Assist  Walk 10 feet activity did not occur: Safety/medical concerns  Assist level: Maximal Assistance - Patient 25 - 49% Assistive device: Walker-hemi   Walk 50 feet activity   Assist Walk 50 feet with 2 turns activity did not occur: Safety/medical concerns         Walk 150 feet activity   Assist Walk 150 feet activity did not occur: Safety/medical concerns         Walk 10 feet on uneven surface  activity   Assist Walk 10 feet on  uneven surfaces activity did not occur: Safety/medical concerns         Wheelchair     Assist Will patient use wheelchair at discharge?: Yes Type of Wheelchair: Manual Wheelchair activity did not occur: Refused  Wheelchair assist level: Moderate Assistance - Patient 50 - 74% Max wheelchair distance: 15'    Wheelchair 50 feet with 2  turns activity    Assist    Wheelchair 50 feet with 2 turns activity did not occur: Refused   Assist Level: Moderate Assistance - Patient 50 - 74%   Wheelchair 150 feet activity     Assist Wheelchair 150 feet activity did not occur: Safety/medical concerns        Medical Problem List and Plan: 1.Left side weaknesssecondary to multiple right brain infarcts adjacent to previous right MCA infarcts on 02/04/2019 in the setting of 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 Follow-up outpatient interventional radiology for ICA aneurysm  Continue CIR  WHO, PRAFO ordered for left side for spasticity/contracture mgt  Repeat head CT 4/1 reviewed, showing evolving infarct as well as chronic infarct. 2. Antithrombotics: -DVT/anticoagulation:Subcutaneous Lovenox -antiplatelet therapy: Aspirin 325 mg daily 3. Pain Management:Lyrica 75 mg daily, Tylenol as needed  Topamax for headache started on 3/31, DC'd on 4/2 due to Lethargy-improving 4. Mood/bipolar disorder -antipsychotic agents: Risperdal 1 mg daily at bedtime 5. Neuropsych: This patientiscapable of making decisions on herown behalf. 6. Skin/Wound Care:Routine skin checks 7. Fluids/Electrolytes/Nutrition:Routine in and out's  8. Hypertension. Monitor with increased mobility. Patient on Norvasc 5 mg daily, Coreg 12.5 mg twice a day prior to admission. Resume as needed Vitals:   02/19/19 2041 02/20/19 0523  BP: (!) 158/79 (!) 139/99  Pulse: 84 77  Resp: 17 16  Temp: 99.4 F (37.4 C) 99.4 F (37.4 C)  SpO2: 96% 97%   Norvasc 2.5 started on 3/31, increased to 5 on 4/2  Labile on 4/10 9. Diastolic congestive heart failure. Monitor for any signs of fluid overload Filed Weights   02/18/19 0707 02/19/19 0553 02/20/19 0523  Weight: 61.2 kg 63.2 kg 63 kg   ?  Reliability on 4/10 10. History oftobacco/cocaine use. Provide counseling 11.  Hyperlipidemia. Lipitor 12.  Constipation: Improving 13.  Spasticity hemiparesis  Tizanidine 2 mg twice daily started on 4/3, increased to 4 mg on 4/5, increased to 6 mg on 4/7, increased to 8 mg twice daily on 4/10  Baclofen 5 3 times daily started on 3/31, DC'd on 4/2 due to Lethargy  Will likely require Botox injection for better spasticity control 14.  Prediabetes  Blood glucose elevated on 4/6  Continue to monitor, will consider changing to carb modified diet if persistently elevated 15.  Hypokalemia  Potassium 4.2 on 4/6  Labs ordered for Monday 16.  Hypoalbuminemia  Supplement initiated on 3/30 17.  Leukopenia: Resolved  WBCs 4.0 on 4/2  Continue to monitor 18.  Thrombocytopenia: Resolved  Platelets 173 on 4/2  Continue to monitor 19.  Hyponatremia  Sodium 134 on 4/6, labs ordered for Monday  Continue to monitor 20.  Fever:?  Resolved  Blood cultures NGTD on 4/10  UA negative, urine culture no growth  Chest x-ray reviewed, unremarkable for acute process  LOS: 14 days A FACE TO FACE EVALUATION WAS PERFORMED  Mainor Hellmann Karis Juba 02/20/2019, 10:55 AM

## 2019-02-20 NOTE — Progress Notes (Signed)
Occupational Therapy Session Note  Patient Details  Name: [name] MRN: [id number] Date of Birth: 07/15/1956  Today's Date: 02/20/2019 OT Individual Time: 0738-0902 OT Individual Time Calculation (min): 84 min    Short Term Goals: Week 3:  OT Short Term Goal 1 (Week 3): STG=LTG 2/2 ELOS  Skilled Therapeutic Interventions/Progress Updates:    Pt greeted semi-reclined in bed and agreaable to OT treatment session. Pt initially said she did not need to go to the bathroom, but OT insisted we try. Pt able to advance LLE towards EOB today, but still needed mod A to elevate trunk. Worked on stand-pivot transfers using hemi walker. Sit<>stand with hemi walker and min A, but pt unable to pivot requiring max/total A to facilitate pivot safely onto wc seat. Attempted stand-pivot again onto commode using grab bars. Pt able to stand again with min A, but was unable to initiate pivot. OT stopped pt and used Stedy to transfer onto toilet for safety. Pt noted to have already been incontinent of large BM and bladder. Pt had a little more BM and voided bladder more in commode. Total A for peri-care. Stedy then used to transfer pt into shower onto wide BSC. OT set-up wash cloth with soap and pt was able to wash upper body. Pt also able to wash peri-area from cut out in commode seat. Pt washed upper legs, but needed assistance with lower legs. Verbal cues to maintain midline posture 2/2 lateral lean to the L. Stedy transferred pt out of shower.  Worked on dressing strategies and assisted LLE into figure 4 position, then pt able to thread LLE into pants. Sit<>stand with hemi walker and min A, while OT then pulled pants over hips. Worked on standing at midline in stedy at the sink using mirror feedback, while OT braided pts hair. Pt completed 4 more sit<>stands using hemi walker ranging from min to mod A pending fatigue. Pt left seated in wc at end of session with alarm belt on and needs met.   Therapy  Documentation Precautions:  Precautions Precautions: Fall Restrictions Weight Bearing Restrictions: No Pain:  denies pain   Therapy/Group: Individual Therapy   S  02/20/2019, 8:51 AM 

## 2019-02-20 NOTE — Progress Notes (Signed)
Social Work Patient ID: [name], female   DOB: [birth year], 63 y.o.   MRN: [id number]  Have reviewed team conference with pt and boyfriend, Romeo Apple. Pt still with significant cognitive impairments and does not easily engage.  She simply states, "OK" to information presented.   Explained to Kieler that goals are being downgraded to mod assist and will need to confirm that he and the [facility] aide can meet these care needs.  He very much wants to bring pt home and will "talk with my family."  I will contact agency who is providing aide services to determine if any concerns there.  Plan to touch base again with [facility] on Monday.  Hensley Aziz, LCSW

## 2019-02-20 NOTE — Progress Notes (Signed)
Physical Therapy Session Note  Patient Details  Name: [name] MRN: [id number] Date of Birth: 03/11/1956  Today's Date: 02/20/2019 PT Individual Time: 1345-1430 PT Individual Time Calculation (min): 45 min   Short Term Goals: Week 3:  PT Short Term Goal 1 (Week 3): =LTGs due to ELOS  Skilled Therapeutic Interventions/Progress Updates:   pt seated in w/c, c/o "i'm tired", but willing to participate with encouragement.  W/c mobility x 120' with min/mod A for Rt LE control during hemi technique.  When asked to propel without facilitation pt unable to problem solve hemi technique.  Sit<>stand and standing tolerance at railing x 3 with wt shifts and pre gait stepping with Rt LE with max A, wt shifts with min/mod facilitation for posture and centering.  kinetron in sitting 3 x 20 reps with AAROM and cues for attention to task.  Pt performs max A stand pivot transfer to bed at end of session.  Supine LTR for passive ROM to reduce tone in Rt LE.  Pt left in bed with needs at hand, alarm set.   Therapy Documentation Precautions:  Precautions Precautions: Fall Restrictions Weight Bearing Restrictions: No General: PT Amount of Missed Time (min): 30 Minutes PT Missed Treatment Reason: Patient fatigue;Patient unwilling to participate Pain:  no c/o pain   Therapy/Group: Individual Therapy  Hallis Meditz 02/20/2019, 2:31 PM

## 2019-02-20 NOTE — Progress Notes (Signed)
Speech Language Pathology Daily Session Note  Patient Details  Name: [name] MRN: [id number] Date of Birth: 17-May-1956  Today's Date: 02/20/2019 SLP Individual Time: 1100-1125 SLP Individual Time Calculation (min): 25 min  Short Term Goals: Week 3: SLP Short Term Goal 1 (Week 3): Patient will utilize speech intelligibility strategies at the sentence level with achieve ~90% intelligibility with Supervision verbal cues.  SLP Short Term Goal 2 (Week 3): Patient will demonstrate sustained attention to tasks for ~15 minutes with Mod A verbal cues.  SLP Short Term Goal 3 (Week 3): Patient will demonstrate functional problem solving for basic and familiar tasks with Mod A verbal cues.  SLP Short Term Goal 4 (Week 3): Patient will attend to left field of enviornment during functional tasks with Mod A verbal cues.  SLP Short Term Goal 5 (Week 3): Patient will respond to questions, etc in 75% of opportunities with Mod A verbal cues.   Skilled Therapeutic Interventions: Skilled treatment session focused on cognitive goals. Upon arrival, patient appeared lethargic and utilized minimal verbal expression throughout session. SLP facilitated session by providing Max A verbal cues for sustained attention, visual scanning to her left field of environment and problem solving during a basic card matching task. Patient left upright in wheelchair with all needs within reach. Continue with current plan of care.      Pain No/Denies Pain   Therapy/Group: Individual Therapy  Karlo Goeden 02/20/2019, 1:46 PM

## 2019-02-20 NOTE — Progress Notes (Signed)
Physical Therapy Weekly Progress Note  Patient Details  Name: [name] MRN: [id number] Date of Birth: 01-03-1956  Beginning of progress report period: February 14, 2019 End of progress report period: February 20, 2019  Patient has met 0 of 4 short term goals. Pt has made little progress towards LTGs over last week 2/2 poor participation, decreased awareness of deficits, and chronic L sided tone. She continues to require max assist for all mobility including bed mobility, transfers, w/c mobility, and short distance gait w/ hemiwalker. Safe functional mobility additionally requires max multimodal cues for L attention, sequencing/technique, weight shifting, and for LLE tone.   Patient continues to demonstrate the following deficits muscle weakness and muscle joint tightness, decreased cardiorespiratoy endurance, impaired timing and sequencing, abnormal tone, unbalanced muscle activation, decreased coordination and decreased motor planning, decreased visual perceptual skills and decreased visual motor skills, decreased attention to left, decreased initiation, decreased attention, decreased awareness, decreased problem solving, decreased safety awareness, decreased memory and delayed processing and decreased sitting balance, decreased standing balance, decreased postural control, hemiplegia and decreased balance strategies and therefore will continue to benefit from skilled PT intervention to increase functional independence with mobility.  Patient not progressing toward long term goals.  See goal revision..  Plan of care revisions: LTGs downgraded to mod assist overall 2/2 lack of progress as detailed above. Will continue to encourage pt's meaningful participation and educate on benefits of therapy to address both acute and chronic stroke impairments.   PT Short Term Goals Week 2:  PT Short Term Goal 1 (Week 2): Pt will transfer bed<>chair w/ mod assist consistently PT Short Term Goal 1 - Progress (Week  2): Not met PT Short Term Goal 2 (Week 2): Pt will ambulate 30' w/ LRAD, max assist PT Short Term Goal 2 - Progress (Week 2): Progressing toward goal PT Short Term Goal 3 (Week 2): Pt will self-propel w/c 50' w/ min assist PT Short Term Goal 3 - Progress (Week 2): Not met PT Short Term Goal 4 (Week 2): Pt will attend to R environment during functional mobility w/ min cues 100% of the time  PT Short Term Goal 4 - Progress (Week 2): Not met Week 3:  PT Short Term Goal 1 (Week 3): =LTGs due to ELOS  [name] 02/20/2019, 8:34 AM

## 2019-02-20 NOTE — Plan of Care (Signed)
  Problem: Consults Goal: RH GENERAL PATIENT EDUCATION Description See Patient Education module for education specifics. Outcome: Progressing   Problem: RH BOWEL ELIMINATION Goal: RH STG MANAGE BOWEL WITH ASSISTANCE Description STG Manage Bowel with Assistance. Outcome: Progressing   Problem: RH SAFETY Goal: RH STG ADHERE TO SAFETY PRECAUTIONS W/ASSISTANCE/DEVICE Description STG Adhere to Safety Precautions With Assistance/Device. Outcome: Progressing   Problem: RH PAIN MANAGEMENT Goal: RH STG PAIN MANAGED AT OR BELOW PT'S PAIN GOAL Outcome: Progressing

## 2019-02-21 ENCOUNTER — Inpatient Hospital Stay (HOSPITAL_COMMUNITY): Payer: Medicare Other

## 2019-02-21 ENCOUNTER — Inpatient Hospital Stay (HOSPITAL_COMMUNITY): Payer: Medicare Other | Admitting: Occupational Therapy

## 2019-02-21 LAB — CULTURE, BLOOD (ROUTINE X 2)
Culture: NO GROWTH
Culture: NO GROWTH
Special Requests: ADEQUATE
Special Requests: ADEQUATE

## 2019-02-21 MED ORDER — LORAZEPAM 2 MG/ML IJ SOLN
1.0000 mg | Freq: Once | INTRAMUSCULAR | Status: AC
  Administered 2019-02-21: 1 mg via INTRAVENOUS
  Filled 2019-02-21: qty 1

## 2019-02-21 NOTE — Progress Notes (Signed)
Occupational Therapy Session Note  Patient Details  Name: [name] MRN: [id number] Date of Birth: September 23, 1956  Today's Date: 02/21/2019 OT Individual Time: 1450-1500 OT Individual Time Calculation (min): 10 min  and Today's Date: 02/21/2019 OT Missed Time: 60 Minutes Missed Time Reason: Patient fatigue   Short Term Goals: Week 3:  OT Short Term Goal 1 (Week 3): STG=LTG 2/2 ELOS  Skilled Therapeutic Interventions/Progress Updates:    Upon entering the room, pt sleeping soundly in bed. Once pt arouse from sleep she declined OT intervention. OT provided maximal encouragement for OOB activities and pt continued to declined all activities offered this session. Pt remained in bed with call bell and all needed items within reach.   Therapy Documentation Precautions:  Precautions Precautions: Fall Restrictions Weight Bearing Restrictions: No General: General OT Amount of Missed Time: 60 Minutes Vital Signs: Therapy Vitals Temp: 98.4 F (36.9 C) Pulse Rate: 87 Resp: 16 BP: (!) 128/95 Patient Position (if appropriate): Sitting Oxygen Therapy SpO2: 97 % O2 Device: Room Air   Therapy/Group: Individual Therapy  Alen Bleacher 02/21/2019, 3:05 PM

## 2019-02-21 NOTE — Progress Notes (Addendum)
[name] is a 63 y.o. female 1956/07/18 [id number]  Subjective: No new complaints. No new problems. Slept well. Feeling OK.  Objective: Vital signs in last 24 hours: Temp:  [98.3 F (36.8 C)-99.1 F (37.3 C)] 99.1 F (37.3 C) (04/11 0438) Pulse Rate:  [81-83] 83 (04/11 0438) Resp:  [18] 18 (04/11 0438) BP: (116-139)/(82-94) 139/94 (04/11 0438) SpO2:  [96 %-99 %] 99 % (04/11 0438) Weight:  [63.1 kg] 63.1 kg (04/11 0438) Weight change: 0.1 kg Last BM Date: 02/21/19  Intake/Output from previous day: 04/10 0701 - 04/11 0700 In: 600 [P.O.:600] Out: -  Last cbgs: CBG (last 3)  No results for input(s): GLUCAP in the last 72 hours.   Physical Exam General: No apparent distress   HEENT: not dry Lungs: Normal effort. Lungs clear to auscultation, no crackles or wheezes. Cardiovascular: Regular rate and rhythm, no edema Abdomen: S/NT/ND; BS(+) Musculoskeletal:  unchanged Neurological: No new neurological deficits Wounds: N/A    Skin: clear   Mental state: Alert, cooperative    Lab Results: BMET    Component Value Date/Time   NA 134 (L) 02/16/2019 0915   K 4.2 02/16/2019 0915   CL 101 02/16/2019 0915   CO2 23 02/16/2019 0915   GLUCOSE 147 (H) 02/16/2019 0915   BUN 14 02/16/2019 0915   CREATININE 0.75 02/16/2019 0915   CALCIUM 8.6 (L) 02/16/2019 0915   GFRNONAA >60 02/16/2019 0915   GFRAA >60 02/16/2019 0915   CBC    Component Value Date/Time   WBC 4.0 02/12/2019 0435   RBC 4.74 02/12/2019 0435   HGB 12.7 02/12/2019 0435   HCT 38.5 02/12/2019 0435   PLT 173 02/12/2019 0435   MCV 81.2 02/12/2019 0435   MCH 26.8 02/12/2019 0435   MCHC 33.0 02/12/2019 0435   RDW 14.1 02/12/2019 0435   LYMPHSABS 1.1 02/12/2019 0435   MONOABS 0.5 02/12/2019 0435   EOSABS 0.0 02/12/2019 0435   BASOSABS 0.0 02/12/2019 0435    Studies/Results: No results found.  Medications: I have reviewed the patient's current medications.  Assessment/Plan:  1.  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  CIR 2.  DVT prophylaxis with Lovenox 3.  Pain management with Lyrica and PRN Tylenol.  Topamax for headaches 4.  Bipolar disorder.  Risperdal at bedtime 5.  Skin care 6.  Hypertension.  Norvasc, Coreg 7.  Diastolic congestive heart failure.  Meds-as per #6 8.  Tobacco and cocaine use.  Counseling was provided 9.  Dyslipidemia.  On Lipitor 10.  Constipation.  On bowel program 11.  Spasticity hemiparesis.  Tizanidine and baclofen.  She may require botox injections 12.  Prediabetes.  Monitoring glucose 13.  Hypokalemia.  Monitoring potassium 14.  Hypoalbuminemia.  On supplements 15.  Thrombocytopenia.  Resolved 16.  Hyponatremia.  Monitoring 17.  Fever.  Monitoring temperature   Length of stay, days: 15  Sonda Primes , MD 02/21/2019, 1:38 PM

## 2019-02-21 NOTE — Progress Notes (Signed)
RN/Nurse Tech at bedside to administer medicine and reposition patient  when suddenly patient's body became spastic and started to have tremors/shaking all over, eyeball rolling, drooling at 2218 hours. Patient's head raised and turned to the right. Seizure episode lasted for about a minute. Patient's mouth suctioned. Vital signs checked. After seizure episode, patient was not yet responsive. Consulting civil engineer at bedside. Dr. Posey Rea informed of incident with order to administer Lorazepam IV. While awaiting IV team, pt responded to pain from needle stick. Pt was able to state her name but perseverates on her birthdate when asked about the current year; confused to place at 2245 hrs. IV team successfully placed an IV and Lorazepam 1mg  administered.

## 2019-02-21 NOTE — Progress Notes (Signed)
Physical Therapy Session Note  Patient Details  Name: [name] MRN: [id number] Date of Birth: 1956-01-27  Today's Date: 02/21/2019 PT Individual Time: 1000-1100 PT Individual Time Calculation (min): 60 min   Short Term Goals: Week 3:  PT Short Term Goal 1 (Week 3): =LTGs due to ELOS  Skilled Therapeutic Interventions/Progress Updates:    Patient in supine and agreeable to PT.  Rolled to L with S and side to sit with mod A for L LE and trunk.  Seated to don pants max cues and total A seated and for sit to stand.  Donned socks and shoes pt dependent.  Max cues throughout LB dressing for keeping R foot on floor and min to mod A for correcting loss of balance in sitting post and to L,  Attempted pivot transfer to R via squat pivot, pt resisting pivot to R and needed to return to sitting with mod to max cues and demonstration still needing max A for safe transfer to R.  In w/c mod A and max cues for propelling w/c about 50' due to pushing with R hand along and not using R foot to steer.  Patient in therapy gym, donned ace wrap to L foot for DF assist and shoe cover for ease of progression.  With HW gait x 10' with max A to progress L LE, cues for posture, sequencing with HW and pt c/o fear of falling needing total assist to sit safely on mat.  Patient sit to stand to [facility] from mat mod A standing weight shifts with mod to max A pt fatigues and leans to R.  Patient sit to supine on mat mod A and in supine performed bridging, and on green therapy ball low trunk rotation, hip and knee flex/ext with a for R.  Patient to sitting mod A and scoot pivot to L to w/c max cues, mod to max A improved ease of transfer, but pt relates felt as if falling.  Patient assisted in w/c to room and left with alarm belt and call bell in reach.   Therapy Documentation Precautions:  Precautions Precautions: Fall Restrictions Weight Bearing Restrictions: No Pain: Pain Assessment Pain Score: 0-No  pain    Therapy/Group: Individual Therapy  [name]  Sheran Lawless, PT 02/21/2019, 4:53 PM

## 2019-02-22 ENCOUNTER — Inpatient Hospital Stay (HOSPITAL_COMMUNITY): Payer: Medicare Other

## 2019-02-22 ENCOUNTER — Inpatient Hospital Stay (HOSPITAL_COMMUNITY): Payer: Medicare Other | Admitting: Occupational Therapy

## 2019-02-22 DIAGNOSIS — G40909 Epilepsy, unspecified, not intractable, without status epilepticus: Secondary | ICD-10-CM

## 2019-02-22 LAB — COMPREHENSIVE METABOLIC PANEL
ALT: 12 U/L (ref 0–44)
AST: 24 U/L (ref 15–41)
Albumin: 3.4 g/dL — ABNORMAL LOW (ref 3.5–5.0)
Alkaline Phosphatase: 61 U/L (ref 38–126)
Anion gap: 15 (ref 5–15)
BUN: 13 mg/dL (ref 8–23)
CO2: 21 mmol/L — ABNORMAL LOW (ref 22–32)
Calcium: 9.1 mg/dL (ref 8.9–10.3)
Chloride: 100 mmol/L (ref 98–111)
Creatinine, Ser: 0.7 mg/dL (ref 0.44–1.00)
GFR calc Af Amer: >60 mL/min (ref >60)
GFR calc non Af Amer: >60 mL/min (ref >60)
Glucose, Bld: 118 mg/dL — ABNORMAL HIGH (ref 70–99)
Potassium: 4.1 mmol/L (ref 3.5–5.1)
Sodium: 136 mmol/L (ref 135–145)
Total Bilirubin: 0.3 mg/dL (ref 0.3–1.2)
Total Protein: 6.1 g/dL — ABNORMAL LOW (ref 6.5–8.1)

## 2019-02-22 LAB — CBC
HCT: 42.3 % (ref 36.0–46.0)
Hemoglobin: 13.2 g/dL (ref 12.0–15.0)
MCH: 25.5 pg — ABNORMAL LOW (ref 26.0–34.0)
MCHC: 31.2 g/dL (ref 30.0–36.0)
MCV: 81.8 fL (ref 80.0–100.0)
Platelets: 235 10*3/uL (ref 150–400)
RBC: 5.17 MIL/uL — ABNORMAL HIGH (ref 3.87–5.11)
RDW: 14 % (ref 11.5–15.5)
WBC: 4.4 10*3/uL (ref 4.0–10.5)
nRBC: 0 % (ref 0.0–0.2)

## 2019-02-22 LAB — GLUCOSE, CAPILLARY: Glucose-Capillary: 120 mg/dL — ABNORMAL HIGH (ref 70–99)

## 2019-02-22 MED ORDER — LORAZEPAM 2 MG/ML IJ SOLN: 1.0000 mg | Freq: Four times a day (QID) | INTRAMUSCULAR | Status: DC | PRN

## 2019-02-22 MED ORDER — LEVETIRACETAM IN NACL 1000 MG/100ML IV SOLN
1000.0000 mg | Freq: Once | INTRAVENOUS | Status: AC
  Administered 2019-02-22: 1000 mg via INTRAVENOUS
  Filled 2019-02-22: qty 100

## 2019-02-22 MED ORDER — LEVETIRACETAM 750 MG PO TABS: 750.0000 mg | ORAL_TABLET | Freq: Two times a day (BID) | ORAL | Status: DC

## 2019-02-22 MED ORDER — LEVETIRACETAM 750 MG PO TABS
750.0000 mg | ORAL_TABLET | Freq: Two times a day (BID) | ORAL | Status: DC
  Administered 2019-02-22 – 2019-03-02 (×16): 750 mg via ORAL
  Filled 2019-02-22 (×19): qty 1

## 2019-02-22 MED ORDER — LORAZEPAM 2 MG/ML IJ SOLN
1.0000 mg | Freq: Once | INTRAMUSCULAR | Status: AC
  Administered 2019-02-22: 1 mg via INTRAVENOUS

## 2019-02-22 MED ORDER — LORAZEPAM 2 MG/ML IJ SOLN
INTRAMUSCULAR | Status: AC
  Administered 2019-02-22: 1 mg via INTRAVENOUS
  Filled 2019-02-22: qty 1

## 2019-02-22 NOTE — Progress Notes (Signed)
Occupational Therapy Session Note  Patient Details  Name: [name] MRN: [id number] Date of Birth: 07-23-1956  Today's Date: 02/22/2019 OT Missed Time: 75 Minutes Missed Time Reason: Other (comment)(seizure activity)  Skilled Therapeutic Interventions/Progress Updates:    Per RN, pt is not medically appropriate to see for OT due to seizure-like activity this AM. 75 minutes missed.   Therapy Documentation Precautions:  Precautions Precautions: Fall Restrictions Weight Bearing Restrictions: No ADL:       Therapy/Group: Individual Therapy  Kaleia Longhi A Berwyn Bigley 02/22/2019, 12:46 PM

## 2019-02-22 NOTE — Procedures (Addendum)
ELECTROENCEPHALOGRAM REPORT   Patient: Forestine Chute       Room #: 4M10C EEG No. ID: [id number] Age: 63 y.o.        Sex: female Referring Physician: Allena Katz Report Date:  02/22/2019        Interpreting Physician: Thana Farr  History: JONIYAH WIXOM is an 63 y.o. female with right MCA infarct having episodes of unresponsiveness and right arm shaking  Medications:  Norvasc, ASA, Lipitor, Keppra, Protonix, Lyrica, Risperdal, Zanaflex  Conditions of Recording:  This is a 21 channel routine scalp EEG performed with bipolar and monopolar montages arranged in accordance to the international 10/20 system of electrode placement. One channel was dedicated to EKG recording.  The patient is in the awake state.  Description:  The waking background activity is asymmetric.  The right hemisphere is attenuated and slow.  It consists of a polymorphic delta activity that is diffusely distributed over the right hemisphere.  It is continuous with no epileptiform activity noted.   Over the left hemisphere the voltage is higher.  The background is irregular, though, and consists of a mixture of frequencies with an underlying polymorphic delta rhythm.  Although a posterior background alpha rhythm can be appreciated at times it is poorly sustained.  Again no epileptiform activity is noted.   There was some minor tremor noted at times of the right upper extremity during this recording with no change in the background noted.   Hyperventilation and intermittent photic stimulation were not performed.  IMPRESSION: This is an abnormal electroencephalogram secondary to background slowing that is more prominent over the right hemisphere with attenuation noted in that region as well, consistent with the patient's history of a right MCA infarct.  No epileptiform activity is noted.     Thana Farr, MD Neurology [phone] 02/22/2019, 11:36 AM

## 2019-02-22 NOTE — Significant Event (Addendum)
Rapid Response Event Note  Overview: Neurologic - Seizure   Initial Focused Assessment: Called by nursing staff to evaluate patient for seizure-like activity. Per nursing staff, patient was alert and verbal when they arrived to set her up to eat, they noticed her eyes gaze upward and then patient's RUE started shaking. When I arrived, patient was not shaking, appeared post ictal, + CR, RUE was rigid but that lasted for about 45 seconds, then her arm relaxed. VSS, patient was on NRB, which I took off her, oxygen saturations were 100% and blood sugar was 120. Rehab MD came to bedside. Per nursing staff, total event lasted maybe 10 minutes. Patient did localize to pain in RUE/RLE, was moving the right side spontaneously. Per staff, patient had a similar episode last night and received Lorazepam 1 mg IV.   Interventions: -- NO RRT Interventions   Plan of Care: -- Monitor neurologic status. Allow patient to wake up prior to feeding her or allowing her to eat, currently is still not awake nor alert enough for POs  Event Summary:    at    Call Time 0742 Arrival Time 0744 End Time 0800  [name] R

## 2019-02-22 NOTE — Consult Note (Addendum)
NEURO HOSPITALIST CONSULT NOTE   Requestig physician: Dr. Allena Katz   Reason for Consult: seizure   History obtained from:  Chart  HPI:                                                                                                                                          [name] is an 63 y.o. 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 Admitted on 02/03/2019 for stroke.  Per chart: this morning patient was initially alert and verbal. The staff noticed an upward gaze of the eyes and then the RUE started to shake.  The total event lasted about 10 minutes, the shaking lasted a short time, but she remained rigid and unable to follow commands for the duration of 10 minutes per RN.  Patient had a similar episode last night and was given ativan 1mg  IV. RRT was called for this event. Patient at this time is an unreliable historian.   Past Medical History:  Diagnosis Date  . Blood transfusion without reported diagnosis   . CHF (congestive heart failure) (HCC)   . Hypertension   . Stroke [facility])     Past Surgical History:  Procedure Laterality Date  . ABDOMINAL SURGERY      Family History  Problem Relation Age of Onset  . Hypertension Mother   . Hypertension Father               Social History:  reports that she has been smoking. She has never used smokeless tobacco. She reports that she does not drink alcohol or use drugs.  No Known Allergies  MEDICATIONS:                                                                                                                     Scheduled: . amLODipine  5 mg Oral Daily  . aspirin  300 mg Rectal Daily   Or  . aspirin  325 mg Oral Daily  . atorvastatin  40 mg Oral q1800  . enoxaparin (LOVENOX) injection  40 mg Subcutaneous q1800  . feeding supplement (PRO-STAT SUGAR FREE 64)  30 mL Oral BID  . levETIRAcetam   750 mg  Oral BID  . pantoprazole  40 mg Oral Daily  . pregabalin  75 mg Oral Daily  . risperiDONE  1 mg Oral QHS  . senna-docusate  1 tablet Oral BID  . tiZANidine  8 mg Oral BID   Continuous: . levETIRAcetam 1,000 mg (02/22/19 0927)   ZOX:[id number] **OR** acetaminophen (TYLENOL) oral liquid 160 mg/5 mL **OR** acetaminophen, LORazepam, sorbitol   ROS:                                                                                                                                        unobtainable from patient due to mental status    Blood pressure 139/86, pulse 84, temperature 97.8 F (36.6 C), resp. rate 20, weight 63.8 kg, SpO2 100 %.   General Examination:                                                                                                       Physical Exam  HEENT-  Normocephalic, no lesions, without obvious abnormality.  Normal external eye and conjunctiva.   Cardiovascular- S1-S2 audible,   Lungs-no rhonchi or wheezing noted, no excessive working breathing.  Saturations within normal limits on RA Extremities- Warm, dry and intact Musculoskeletal- left arm contracted and in a brace.  Skin-warm and dry, no hyperpigmentation, vitiligo, or suspicious lesions  Neurological Examination Mental Status: Alert, oriented to name only. Not oriented to age/ month/ year/place, thought content appropriate.  Speech fluent without evidence of aphasia.  Able to follow commands without difficulty. No gaze preference or nystagmus noted. Right thumb with a consistent twitching motion.  Cranial Nerves: II:  Visual fields grossly normal,  III,IV, VI: ptosis not present, extra-ocular motions intact bilaterally pupils equal, round, reactive to light  V,VII: smile asymmetric, left facial droop noted,  facial light touch sensation normal bilaterally VIII: hearing normal bilaterally IX,X: uvula rises symmetrically XI: bilateral shoulder shrug XII: midline tongue  extension Motor: Right : Upper extremity   5/5  Left:     Upper extremity   0/5  Lower extremity   5/5   Lower extremity   4/5 Tone and bulk:normal tone throughout; no atrophy noted Sensory: Pinprick and light touch intact throughout, bilaterally Deep Tendon Reflexes: 2+ and symmetric biceps. Hyperreflexic LLE, 2+ RLE Plantars: Right: downgoing   Left: downgoing Cerebellar: normal finger-to-nose right hand only, patient does not follow commands with L upper and lower extremities.  Was able to withdraw LLE with good strength to noxious. Patient  did not move LUE to noxious, but did take her right hand to try and remove noxious stimuli.  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 Admitted on 02/03/2019 for stroke  Seizures 2/2 R MCA stroke    Recommendations: -- 1000 mg load of keppra; followed by keppra 750 mg BID --rEEG --Seizure precautions -- CT head   Tracy LucksJessica Williams, MSN, NP-C [facility] [phone]  Attending neurologist's note to follow   02/22/2019, 8:52 AM  NEUROHOSPITALIST ADDENDUM Performed a face to face diagnostic evaluation.   I have reviewed the contents of history and physical exam as documented by PA/ARNP/Resident and agree with above documentation.  I have discussed and formulated the above plan as documented. Edits to the note have been made as needed.   Patient had 2 partial seizures with secondary generalization.  Your focus likely due to right MCA stroke.  Electrolytes within  normal limits.  No obvious sign of infection. Loaded with 1 g Keppra and started on 750 mg twice daily. Since patient in the afternoon, alert and oriented to place and person.  Left hemiparesis on examination.  Recommend repeating CT head to make sure patient has not had any hemorrhagic conversion.  Neurology will be available as needed.   Georgiana SpinnerSushanth Aroor MD [facility] [phone]   If 7pm to 7am, please call on call as listed on AMION.

## 2019-02-22 NOTE — Progress Notes (Signed)
Physical Therapy Session Note  Patient Details  Name: [name] MRN: [id number] Date of Birth: [birth year]  Today's Date: 02/22/2019 PT Missed Time: 60 Minutes Missed Time Reason: Patient ill (Comment)(seizure activity)  Short Term Goals: Week 3:  PT Short Term Goal 1 (Week 3): =LTGs due to ELOS  Skilled Therapeutic Interventions/Progress Updates:    Per RN, pt is not medically appropriate to see for PT due to seizure-like activity this AM. Pt missed 60 minutes of skilled therapy tx this session.   Therapy Documentation Precautions:  Precautions Precautions: Fall Restrictions Weight Bearing Restrictions: No     Therapy/Group: Individual Therapy  Cresenciano Genre, PT, DPT 02/22/2019, 3:17 PM

## 2019-02-22 NOTE — Progress Notes (Signed)
Nurse called to room by nurse tech, patient started having a seizure just as they were about to help her with breakfast. Per nurse tech patient looked up and her eyes rolled back her right arm starting jerking. Nurse got to room and patient extremities were rigid. O2 was given.  Per nurse tech seizure started at 7:40. Seizure lasted until about 7:50. Patient is now arousable but does not respond to commands.   Rapid and MD was notified.  Rapid nurse assessed patient and will keep check on her as well. Lorri Frederick, LPN

## 2019-02-22 NOTE — Progress Notes (Addendum)
[name] is a 63 y.o. female 12-08-1955 [id number]  Subjective:  Seizure episodes x 2 since last night  Per staff:  "Called by nursing staff to evaluate patient for seizure-like activity. Per nursing staff, patient was alert and verbal when they arrived to set her up to eat, they noticed her eyes gaze upward and then patient's RUE started shaking. When I arrived, patient was not shaking, appeared post ictal, + CR, RUE was rigid but that lasted for about 45 seconds, then her arm relaxed. VSS, patient was on NRB, which I took off her, oxygen saturations were 100% and blood sugar was 120. Rehab MD came to bedside. Per nursing staff, total event lasted maybe 10 minutes. Patient did localize to pain in RUE/RLE, was moving the right side spontaneously. Per staff, patient had a similar episode last night and received Lorazepam 1 mg IV. "   Objective: Vital signs in last 24 hours: Temp:  [97.8 F (36.6 C)-98.6 F (37 C)] 97.8 F (36.6 C) (04/12 0337) Pulse Rate:  [84-111] 84 (04/12 0337) Resp:  [16-20] 20 (04/12 0337) BP: (128-171)/(81-95) 139/86 (04/12 0337) SpO2:  [97 %-100 %] 100 % (04/12 0337) Weight:  [63.8 kg] 63.8 kg (04/12 0337) Weight change: 0.7 kg Last BM Date: 02/21/19  Intake/Output from previous day: 04/11 0701 - 04/12 0700 In: 918 [P.O.:918] Out: -  Last cbgs: CBG (last 3)  Recent Labs    02/22/19 0748  GLUCAP 120*     Physical Exam General: No apparent distress   HEENT: not dry Lungs: Normal effort. Lungs clear to auscultation, no crackles or wheezes. Cardiovascular: Regular rate and rhythm, no edema Abdomen: S/NT/ND; BS(+) Musculoskeletal:  unchanged Neurological: No new neurological deficits Wounds: N/A    Skin: clear  Mental state: Awake, post-ictal     Lab Results: BMET    Component Value Date/Time   NA 136 02/22/2019 0417   K 4.1 02/22/2019 0417   CL 100 02/22/2019 0417   CO2 21 (L) 02/22/2019 0417   GLUCOSE 118 (H) 02/22/2019 0417   BUN 13 02/22/2019 0417   CREATININE 0.70 02/22/2019 0417   CALCIUM 9.1 02/22/2019 0417   GFRNONAA >60 02/22/2019 0417   GFRAA >60 02/22/2019 0417   CBC    Component Value Date/Time   WBC 4.4 02/22/2019 0417   RBC 5.17 (H) 02/22/2019 0417   HGB 13.2 02/22/2019 0417   HCT 42.3 02/22/2019 0417   PLT 235 02/22/2019 0417   MCV 81.8 02/22/2019 0417   MCH 25.5 (L) 02/22/2019 0417   MCHC 31.2 02/22/2019 0417   RDW 14.0 02/22/2019 0417   LYMPHSABS 1.1 02/12/2019 0435   MONOABS 0.5 02/12/2019 0435   EOSABS 0.0 02/12/2019 0435   BASOSABS 0.0 02/12/2019 0435    Studies/Results: No results found.  Medications: I have reviewed the patient's current medications.  Assessment/Plan:  1.  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  CIR 2.  DVT prophylaxis with Lovenox 3.  Pain management with Lyrica and PRN Tylenol.  Topamax for headaches 4.  Bipolar disorder.  Risperdal at bedtime 5.  Skin care 6.  Hypertension.  Norvasc, Coreg 7.  Diastolic congestive heart failure.  Meds-as per #6 8.  Tobacco and cocaine use.  Counseling was provided 9.  Dyslipidemia.  On Lipitor 10.  Constipation.  On bowel program 11.  Spasticity hemiparesis.  Tizanidine and baclofen.  She may  require botox injections 12.  Prediabetes.  Monitoring glucose 13.  Hypokalemia.  Monitoring potassium 14.  Hypoalbuminemia.  On supplements 15.  Thrombocytopenia.  Resolved 16.  Hyponatremia.  Monitoring 17.  Fever.  Monitoring temperature 18. Seizure -- ?focal w/impaired awareness vs generalized. Lorazepam given. Neurology consult. Start Keppra per Neurology.     Length of stay, days: 16  Sonda Primes , MD 02/22/2019, 8:25 AM

## 2019-02-22 NOTE — Progress Notes (Signed)
EEG completed; results pending.    

## 2019-02-23 ENCOUNTER — Inpatient Hospital Stay (HOSPITAL_COMMUNITY): Payer: Medicare Other | Admitting: Physical Therapy

## 2019-02-23 ENCOUNTER — Inpatient Hospital Stay (HOSPITAL_COMMUNITY): Payer: Medicare Other | Admitting: Occupational Therapy

## 2019-02-23 ENCOUNTER — Inpatient Hospital Stay (HOSPITAL_COMMUNITY): Payer: Medicare Other | Admitting: Speech Pathology

## 2019-02-23 DIAGNOSIS — R569 Unspecified convulsions: Secondary | ICD-10-CM

## 2019-02-23 LAB — CBC WITH DIFFERENTIAL/PLATELET
Abs Immature Granulocytes: 0.01 10*3/uL (ref 0.00–0.07)
Basophils Absolute: 0 10*3/uL (ref 0.0–0.1)
Basophils Relative: 1 %
Eosinophils Absolute: 0 10*3/uL (ref 0.0–0.5)
Eosinophils Relative: 1 %
HCT: 45.6 % (ref 36.0–46.0)
Hemoglobin: 14 g/dL (ref 12.0–15.0)
Immature Granulocytes: 0 %
Lymphocytes Relative: 24 %
Lymphs Abs: 0.9 10*3/uL (ref 0.7–4.0)
MCH: 25.1 pg — ABNORMAL LOW (ref 26.0–34.0)
MCHC: 30.7 g/dL (ref 30.0–36.0)
MCV: 81.7 fL (ref 80.0–100.0)
Monocytes Absolute: 0.4 10*3/uL (ref 0.1–1.0)
Monocytes Relative: 11 %
Neutro Abs: 2.3 10*3/uL (ref 1.7–7.7)
Neutrophils Relative %: 63 %
Platelets: 215 10*3/uL (ref 150–400)
RBC: 5.58 MIL/uL — ABNORMAL HIGH (ref 3.87–5.11)
RDW: 13.8 % (ref 11.5–15.5)
WBC: 3.6 10*3/uL — ABNORMAL LOW (ref 4.0–10.5)
nRBC: 0 % (ref 0.0–0.2)

## 2019-02-23 LAB — BASIC METABOLIC PANEL
Anion gap: 13 (ref 5–15)
BUN: 13 mg/dL (ref 8–23)
CO2: 22 mmol/L (ref 22–32)
Calcium: 9.2 mg/dL (ref 8.9–10.3)
Chloride: 103 mmol/L (ref 98–111)
Creatinine, Ser: 0.73 mg/dL (ref 0.44–1.00)
GFR calc Af Amer: >60 mL/min (ref >60)
GFR calc non Af Amer: >60 mL/min (ref >60)
Glucose, Bld: 113 mg/dL — ABNORMAL HIGH (ref 70–99)
Potassium: 3.9 mmol/L (ref 3.5–5.1)
Sodium: 138 mmol/L (ref 135–145)

## 2019-02-23 MED ORDER — AMLODIPINE BESYLATE 2.5 MG PO TABS
2.5000 mg | ORAL_TABLET | Freq: Once | ORAL | Status: AC
  Administered 2019-02-23: 2.5 mg via ORAL
  Filled 2019-02-23: qty 1

## 2019-02-23 MED ORDER — AMLODIPINE BESYLATE 5 MG PO TABS
7.5000 mg | ORAL_TABLET | Freq: Every day | ORAL | Status: DC
  Administered 2019-02-24 – 2019-03-02 (×7): 7.5 mg via ORAL
  Filled 2019-02-23 (×7): qty 1

## 2019-02-23 NOTE — Progress Notes (Signed)
Occupational Therapy Session Note  Patient Details  Name: [name] MRN: [id number] Date of Birth: May 01, 1956  Today's Date: 02/23/2019 OT Individual Time: 0735-0920 OT Individual Time Calculation (min): 105 min  Skilled Therapeutic Interventions/Progress Updates:    Pt greeted semi-reclined in bed awake and agreeable to OT treatment session. Pt reports she does not remember seizure episodes from yesterday and states she slept well. Pt needed max A to come to sitting today. Pt also demonstrated decreased sitting balance and needed min to mod A 2/2 LOB to posteriorly and to the L. Pt did not initiate first sit<>stand for transfer so returned to sitting. Upon second trial, pt needed max A to stand-pivot to there wc. Pt slower to initiate today. Pt declined need to go to the bathroom and refused shower today, but agreeable to bathing/drressing at the sink. Attempted 3 sit<>stands without stedy and max A with pt needing max-total A to maintain standing balance 2/2 heavy push to the L. Tried using mirror feedback and environmental cues to bring her to midline, but minimally successful. Stedy used for sit<>stands so OT could assist with washing buttocks and changing brief (pt already incontinent of bladder.) Pt more confused today as well with difficulty sequencing UB dressing task and she did not know what to do with deodorant, which is different than normal. OT set pt up for breakfast and she was able to eat without assistance. Worked on visual scanning during meal to locate food items on L side of plate. After discussion with nursing, felt it was best to return pt to bed 2/2 seizure activity yesterday. Max A stand-pivot back to bed. Worked on sitting balance at EOB with pt needing mod/max A to achieve sitting balance. Had pt lean down onto R elbow to decrease pushing. Also had pt reach for therapist  up and to the R side to promote trunk activation and decrease pushing. Max A to return to bed. OT placed  bed in chair position and left bed alarm on and needs met.   Therapy Documentation Precautions:  Precautions Precautions: Fall Restrictions Weight Bearing Restrictions: No Pain:  denies pain  Therapy/Group: Individual Therapy  Valma Cava 02/23/2019, 8:51 AM

## 2019-02-23 NOTE — Progress Notes (Signed)
Physical Therapy Session Note  Patient Details  Name: [name] MRN: [id number] Date of Birth: 25-Jun-1956  Today's Date: 02/23/2019 PT Individual Time: 0383-3383 PT Individual Time Calculation (min): 48 min   Short Term Goals: Week 3:  PT Short Term Goal 1 (Week 3): =LTGs due to ELOS  Skilled Therapeutic Interventions/Progress Updates:   Pt in supine and agreeable to therapy, no c/o pain. Pt very alert and pleasant, requested a drink of water and meaningfully participated in assisting therapist w/ donning pants. Supine to sit w/ mod assist. Stand pivot transfer to w/c w/ mod assist, much improved since last few sessions with this therapist, pt much more willing to initiate and participate in movement. Total assist w/c transport to/from therapy gym for time management. Sit<>stands at rail, min assist. Pre-gait tasks in standing including L and R forward and backward stepping to work on L quad activation in single leg stance and any initiation of stepping on L side, however continues to be total assist. Performed 3-4 stands, min-mod assist to maintain standing w/ tactile and manual cues for upright posture at sternum and L glut musculature. Pt refused further standing activity 2/2 BLE pain in standing, unable to describe or localize despite max verbal cues. Agreeable to participate in seated activity. Performed peg board task to work on initiation, attention to task, problem solving and spatial awareness/scanning. Mod-max verbal, visual, and hand-over-hand cues. Pt able to initiate stating which color she thought was next and picking up peg ~75% of the time w/o cues. Needed max-total cues to identify correct peg placement and to visually scan to L side of board. Returned to room and ended session in w/c, all needs in reach. RN agreeable to pt sitting OOB for a short amount of time w/ belt on and w/o supervision, agreeable to check on pt.   Therapy Documentation Precautions:   Precautions Precautions: Fall Restrictions Weight Bearing Restrictions: No Vital Signs: Therapy Vitals Temp: 99.1 F (37.3 C) Temp Source: Oral Pulse Rate: 89 Resp: 14 BP: 134/89 Patient Position (if appropriate): Lying Oxygen Therapy SpO2: 100 % O2 Device: Room Air  Therapy/Group: Individual Therapy  Termaine Roupp Melton Krebs 02/23/2019, 3:25 PM

## 2019-02-23 NOTE — Progress Notes (Signed)
[facility] PHYSICAL MEDICINE & REHABILITATION PROGRESS NOTE   Subjective/Complaints: Patient seen lying in bed this morning.  She states she slept well overnight.  She states she had a good weekend.  However, over the weekend, per chart review patient was noted to have 2 seizure-like episodes and neurology was consulted.  She was started on Keppra.  A head CT was ordered.  EEG was ordered-unremarkable for epileptiform activity.  ROS: Denies CP, shortness of breath, nausea, vomiting, diarrhea.  Objective:   Ct Head Wo Contrast  Result Date: 02/22/2019 CLINICAL DATA:  63 y/o  F; seizure today. EXAM: CT HEAD WITHOUT CONTRAST TECHNIQUE: Contiguous axial images were obtained from the base of the skull through the vertex without intravenous contrast. COMPARISON:  02/11/2019 CT head.  02/03/2019 MRI head. 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 No new large acute stroke, hemorrhage, extra-axial collection, or mass effect is evident. Background of nonspecific white matter hypodensities are compatible with chronic microvascular ischemic changes and there is diffuse volume loss of the brain stable in comparison with prior MRI given differences in technique. Vascular: No hyperdense vessel or unexpected calcification. Skull: Normal. Negative for fracture or focal lesion. Sinuses/Orbits: Mild mucosal thickening within the left maxillary sinus. Additional visible paranasal sinuses and the mastoid air cells are unremarkable. Orbits are unremarkable. Other: None. IMPRESSION: 1. No acute intracranial abnormality identified. 2. Stable distribution of large chronic infarction in the right cerebral hemisphere. Stable advanced chronic microvascular ischemic changes and volume loss of the brain. Electronically Signed   By: Mitzi Hansen M.D.   On:  02/22/2019 23:18   Recent Labs    02/22/19 0417 02/23/19 1007  WBC 4.4 3.6*  HGB 13.2 14.0  HCT 42.3 45.6  PLT 235 215   Recent Labs    02/22/19 0417 02/23/19 1007  NA 136 138  K 4.1 3.9  CL 100 103  CO2 21* 22  GLUCOSE 118* 113*  BUN 13 13  CREATININE 0.70 0.73  CALCIUM 9.1 9.2    Intake/Output Summary (Last 24 hours) at 02/23/2019 1221 Last data filed at 02/23/2019 0930 Gross per 24 hour  Intake 652 ml  Output -  Net 652 ml     Physical Exam: Vital Signs Blood pressure 140/82, pulse 89, temperature 97.9 F (36.6 C), temperature source Oral, resp. rate 18, weight 61.8 kg, SpO2 97 %. Constitutional: NAD.  Vital signs reviewed. HENT: Normocephalic.  Atraumatic.  Poor dentition. Eyes: EOMI.  No discharge. Cardiovascular: No JVD. Respiratory: Normal effort. GI: Non--distended. Musc: No edema or tenderness in extremities. Neurologic: Alert Facial weakness, unchanged Dysarthria, unchanged Motor:  0/5 LUE, unchanged 0/5 hip flexion, distally 0/5 LLE now Increased tone LUE/LLE, improving Skin: Warm and dry.  Intact. Psych: Slowed  Assessment/Plan: 1. Functional deficits secondary to RIght Basal ganglia infarct which require 3+ hours per day of interdisciplinary therapy in a comprehensive inpatient rehab setting.  Physiatrist is providing close team supervision and 24 hour management of active medical problems listed below.  Physiatrist and rehab team continue to assess barriers to discharge/monitor patient progress toward functional and medical goals  Care Tool:  Bathing    Body parts bathed by patient: Chest, Front perineal area, Abdomen, Right upper leg, Left upper leg, Left arm   Body parts bathed by helper: Buttocks, Left lower leg, Right lower leg     Bathing assist Assist Level: Maximal Assistance -  Patient 24 - 49%     Upper Body Dressing/Undressing Upper body dressing Upper body dressing/undressing activity did not occur (including orthotics):  Refused What is the patient wearing?: Pull over shirt    Upper body assist Assist Level: Maximal Assistance - Patient 25 - 49%    Lower Body Dressing/Undressing Lower body dressing      What is the patient wearing?: Incontinence brief, Pants     Lower body assist Assist for lower body dressing: Total Assistance - Patient < 25%     Toileting Toileting    Toileting assist Assist for toileting: Maximal Assistance - Patient 25 - 49%     Transfers Chair/bed transfer  Transfers assist     Chair/bed transfer assist level: Maximal Assistance - Patient 25 - 49%     Locomotion Ambulation   Ambulation assist   Ambulation activity did not occur: Safety/medical concerns  Assist level: Maximal Assistance - Patient 25 - 49% Assistive device: Walker-hemi(& ace wrap R LE) Max distance: 10'   Walk 10 feet activity   Assist  Walk 10 feet activity did not occur: Safety/medical concerns  Assist level: Maximal Assistance - Patient 25 - 49% Assistive device: Walker-hemi, Other (comment)(ace wrap R LE)   Walk 50 feet activity   Assist Walk 50 feet with 2 turns activity did not occur: Safety/medical concerns         Walk 150 feet activity   Assist Walk 150 feet activity did not occur: Safety/medical concerns         Walk 10 feet on uneven surface  activity   Assist Walk 10 feet on uneven surfaces activity did not occur: Safety/medical concerns         Wheelchair     Assist Will patient use wheelchair at discharge?: Yes Type of Wheelchair: Manual Wheelchair activity did not occur: Refused  Wheelchair assist level: Moderate Assistance - Patient 50 - 74% Max wheelchair distance: 94'    Wheelchair 50 feet with 2 turns activity    Assist    Wheelchair 50 feet with 2 turns activity did not occur: Refused   Assist Level: Moderate Assistance - Patient 50 - 74%   Wheelchair 150 feet activity     Assist Wheelchair 150 feet activity did not  occur: Safety/medical concerns        Medical Problem List and Plan: 1.Left side weaknesssecondary to multiple right brain infarcts adjacent to previous right MCA infarcts on 02/04/2019 in the setting of 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 Follow-up outpatient interventional radiology for ICA aneurysm  Continue CIR  WHO, PRAFO ordered for left side for spasticity/contracture mgt  Repeat head CT 4/1 reviewed, showing evolving infarct as well as chronic infarct, repeat head CT on 4/12 reviewed- stable infarct. 2. Antithrombotics: -DVT/anticoagulation:Subcutaneous Lovenox -antiplatelet therapy: Aspirin 325 mg daily 3. Pain Management:Lyrica 75 mg daily, Tylenol as needed  Topamax for headache started on 3/31, DC'd on 4/2 due to Lethargy-improving 4. Mood/bipolar disorder -antipsychotic agents: Risperdal 1 mg daily at bedtime 5. Neuropsych: This patientiscapable of making decisions on herown behalf. 6. Skin/Wound Care:Routine skin checks 7. Fluids/Electrolytes/Nutrition:Routine in and out's  8. Hypertension. Monitor with increased mobility. Patient on Norvasc 5 mg daily, Coreg 12.5 mg twice a day prior to admission. Resume as needed Vitals:   02/23/19 0424 02/23/19 0925  BP: (!) 142/84 140/82  Pulse: 89   Resp: 18   Temp: 97.9 F (36.6 C)   SpO2: 97%    Norvasc  2.5 started on 3/31, increased to 5 on 4/2, increased to 7.5 on 4/13 9. Diastolic congestive heart failure. Monitor for any signs of fluid overload Filed Weights   02/21/19 0438 02/22/19 0337 02/23/19 0429  Weight: 63.1 kg 63.8 kg 61.8 kg   ?  Reliability on 4/13 10. History oftobacco/cocaine use. Provide counseling 11. Hyperlipidemia. Lipitor 12.  Constipation: Improving 13.  Spasticity hemiparesis  Tizanidine 2 mg twice daily started on 4/3, increased to 4 mg on 4/5, increased to 6 mg on 4/7, increased to 8 mg twice daily on  4/10  Baclofen 5 3 times daily started on 3/31, DC'd on 4/2 due to Lethargy  Overall improving, however Will likely require Botox injection for better spasticity control 14.  Prediabetes  Blood glucose slightly elevated on 4/13  Continue to monitor, will consider changing to carb modified diet if persistently elevated, will however will avoid at present to encourage nutritional intake 15.  Hypokalemia: Resolved  Potassium 3.9 on 4/13 16.  Hypoalbuminemia  Supplement initiated on 3/30 17.  Leukopenia:   WBCs 3.6 on 4/13  Continue to monitor 18.  Thrombocytopenia: Resolved  Platelets 173 on 4/2  Continue to monitor 19.  Hyponatremia: Resolved  Sodium 138 on 4/13  Continue to monitor 20.  Fever:  Resolved  Blood cultures no growth  UA negative, urine culture no growth  Chest x-ray reviewed, unremarkable for acute process 21.  Seizures  Keppra started by neurology  LOS: 17 days A FACE TO FACE EVALUATION WAS PERFORMED   Karis Jubanil  02/23/2019, 12:21 PM

## 2019-02-23 NOTE — Progress Notes (Signed)
Speech Language Pathology Daily Session Note  Patient Details  Name: LANNY NAYAR MRN: [id number] Date of Birth: 29-Apr-1956  Today's Date: 02/23/2019 SLP Individual Time: 1030-1100 SLP Individual Time Calculation (min): 30 min  Short Term Goals: Week 3: SLP Short Term Goal 1 (Week 3): Patient will utilize speech intelligibility strategies at the sentence level with achieve ~90% intelligibility with Supervision verbal cues.  SLP Short Term Goal 2 (Week 3): Patient will demonstrate sustained attention to tasks for ~15 minutes with Mod A verbal cues.  SLP Short Term Goal 3 (Week 3): Patient will demonstrate functional problem solving for basic and familiar tasks with Mod A verbal cues.  SLP Short Term Goal 4 (Week 3): Patient will attend to left field of enviornment during functional tasks with Mod A verbal cues.  SLP Short Term Goal 5 (Week 3): Patient will respond to questions, etc in 75% of opportunities with Mod A verbal cues.   Skilled Therapeutic Interventions: Skilled treatment session focused on cognitive goals. Upon arrival, patient was asleep in bed and required more than a reasonable amount of time for arousal. SLP facilitated session by providing Max-Total hand over hand assist for initiation, problem solving and sustained attention during a basic scanning task. Patient with minimal verbal responses and required multiple repetitions. Session ended early due to fatigue and patient's inability to participate effectively. RN aware. Patient left upright in bed with alarm on and all needs within reach. Continue with current plan of care.       Pain No/Denies Pain   Therapy/Group: Individual Therapy  Alisson Rozell 02/23/2019, 12:34 PM

## 2019-02-24 ENCOUNTER — Inpatient Hospital Stay (HOSPITAL_COMMUNITY): Payer: Medicare Other | Admitting: Occupational Therapy

## 2019-02-24 ENCOUNTER — Inpatient Hospital Stay (HOSPITAL_COMMUNITY): Payer: Medicare Other | Admitting: Physical Therapy

## 2019-02-24 ENCOUNTER — Inpatient Hospital Stay (HOSPITAL_COMMUNITY): Payer: Medicare Other | Admitting: Speech Pathology

## 2019-02-24 NOTE — Progress Notes (Signed)
Physical Therapy Session Note  Patient Details  Name: [name] MRN: [id number] Date of Birth: [birth year]  Today's Date: 02/24/2019 PT Individual Time: 1150-1215 AND 1355-1410 PT Individual Time Calculation (min): 25 min AND 15 min  Short Term Goals: Week 3:  PT Short Term Goal 1 (Week 3): =LTGs due to ELOS  Skilled Therapeutic Interventions/Progress Updates:   Session 1:  Pt in w/c and agreeable to therapy, no c/o pain. Total assist w/c transport to/from therapy gym. Worked on gait training and pre-gait tasks at rail. Ambulated 30' w/ mod-max assist overall for lateral weight shifting, upright posture, and LLE management. Only min assist needed to block knee, however total assist to advance limb 2/2 absent L hip flexion. Sit<>stands at rail w/ min assist and worked on L forward and backward stepping, manual assist for R lateral weight shifting to improve foot clearance. Able to tell that pt was attempting to advance limb, however no muscle activation. Performed multiple reps of standing. Returned to room and ended session in w/c, all needs in reach.   Session 2:  Pt in w/c and initially refusing to participate in therapy, w/ encouragement eventually agreeable to work on w/c mobility. Removed cushion from w/c to allow for greater LE contact to floor. Attempted to self-propel via R hemi technique. Continues to required max-total assist for cues needed for technique, attention to task, and for propelling. It remains non-functional at this time despite max encouragement. Pt stated she would not go any further. Returned to room and performed max assist stand pivot back to EOB and to supine. Missed 15 min of skilled PT 2/2 refusal.  Therapy Documentation Precautions:  Precautions Precautions: Fall Restrictions Weight Bearing Restrictions: No  Therapy/Group: Individual Therapy  [name] 02/24/2019, 12:48 PM

## 2019-02-24 NOTE — Progress Notes (Signed)
Speech Language Pathology Daily Session Note  Patient Details  Name: [name] MRN: [id number] Date of Birth: Jul 24, 1956  Today's Date: 02/24/2019 SLP Individual Time: 1000-1055 SLP Individual Time Calculation (min): 55 min  Short Term Goals: Week 3: SLP Short Term Goal 1 (Week 3): Patient will utilize speech intelligibility strategies at the sentence level with achieve ~90% intelligibility with Supervision verbal cues.  SLP Short Term Goal 2 (Week 3): Patient will demonstrate sustained attention to tasks for ~15 minutes with Mod A verbal cues.  SLP Short Term Goal 3 (Week 3): Patient will demonstrate functional problem solving for basic and familiar tasks with Mod A verbal cues.  SLP Short Term Goal 4 (Week 3): Patient will attend to left field of enviornment during functional tasks with Mod A verbal cues.  SLP Short Term Goal 5 (Week 3): Patient will respond to questions, etc in 75% of opportunities with Mod A verbal cues.   Skilled Therapeutic Interventions: Skilled treatment session focused on cognitive goals. SLP facilitated session by providing extra time and Mod A verbal and visual cues for problem solving during a basic but novel sequencing and scanning task. Patient appeared more alert and awake this session with overall increased verbal expression/response throughout session. Patient required total A to use phone to call boyfriend at end of session. Patient left upright in wheelchair with alarm on and continue with current plan of care.      Pain No/Denies Pain   Therapy/Group: Individual Therapy  Jalisa Sacco 02/24/2019, 12:47 PM

## 2019-02-24 NOTE — Progress Notes (Signed)
Occupational Therapy Session Note  Patient Details  Name: [name] MRN: [id number] Date of Birth: 12-12-1955  Today's Date: 02/24/2019 OT Individual Time: 0110-0349 OT Individual Time Calculation (min): 83 min    Short Term Goals: Week 3:  OT Short Term Goal 1 (Week 3): STG=LTG 2/2 ELOS  Skilled Therapeutic Interventions/Progress Updates:    Pt had a great morning with OT today. Pt more alert and participated well throughout session. Pt greeted semi-reclined in bed eating breakfast and agreeable to shower today. Pt came to sitting EOB with improved initiation and assistance to advance LLE and min A to elevate trunk. Pt with much improved sitting balance today requiring min A initially, then able to maintain sitting balance with supervision. Stand-pivot transfer to wc on stronger R side with increased time to achieve hip extension and pivot, but overall min/mod A. Pt said she did not have to go to the bathroom but agreeable to try anyway. Stand-pivot to commode on R side using grab bars and mod A. Pt even able to maintain standing long enough for OT to remove brief. Pt had already been incontinent of bladdder and did not void more in commode. Stedy then used to transfer pt into shower for safety. Pt did a great job bathing today and was able to wash peri-area using cut out in commode seat and was even able to use leaning method to wash buttocks while OT provided min A for balance. Dressing completed wc at the sink with OT assist placing LLE into figure 4 position, pt able to thread pant legs. Sit<>stand with increased time and min A, then Min A for balance while pt pulled her own pants up today! Much better standing balance with decreased lateral lean throughout session. Pt brought to therapy gym for blocked practice stand-pivot transfers. Transfer to the stronger R side were min to mod A. Transfers to the L side using hemi walker and without required more max A to facilitate pivot. Pt returned to  room and left seated in wc with safety belt on and needs met.   Therapy Documentation Precautions:  Precautions Precautions: Fall Restrictions Weight Bearing Restrictions: No Pain:   denies pain  Therapy/Group: Individual Therapy  Valma Cava 02/24/2019, 8:34 AM

## 2019-02-24 NOTE — Progress Notes (Signed)
No active seizures noted throughout shift, aroused and talkative, states that she is just tired, taking po liquids well, refer to assessment data sheet, Call bell with in reach, bed alarm on, Incontinent care provided., monitor

## 2019-02-24 NOTE — Progress Notes (Signed)
[facility] PHYSICAL MEDICINE & REHABILITATION PROGRESS NOTE   Subjective/Complaints: Patient seen sitting up in bed this morning.  She states she slept well overnight.  She is about to work with OT.  She notes that she was sedentary at baseline, sitting in bed all day watching TV only getting up to use the restroom.  ROS: Denies CP, shortness of breath, nausea, vomiting, diarrhea.  Objective:   Ct Head Wo Contrast  Result Date: 02/22/2019 CLINICAL DATA:  63 y/o  F; seizure today. EXAM: CT HEAD WITHOUT CONTRAST TECHNIQUE: Contiguous axial images were obtained from the base of the skull through the vertex without intravenous contrast. COMPARISON:  02/11/2019 CT head.  02/03/2019 MRI head. 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 No new large acute stroke, hemorrhage, extra-axial collection, or mass effect is evident. Background of nonspecific white matter hypodensities are compatible with chronic microvascular ischemic changes and there is diffuse volume loss of the brain stable in comparison with prior MRI given differences in technique. Vascular: No hyperdense vessel or unexpected calcification. Skull: Normal. Negative for fracture or focal lesion. Sinuses/Orbits: Mild mucosal thickening within the left maxillary sinus. Additional visible paranasal sinuses and the mastoid air cells are unremarkable. Orbits are unremarkable. Other: None. IMPRESSION: 1. No acute intracranial abnormality identified. 2. Stable distribution of large chronic infarction in the right cerebral hemisphere. Stable advanced chronic microvascular ischemic changes and volume loss of the brain. Electronically Signed   By: Mitzi HansenLance  Furusawa-Stratton M.D.   On: 02/22/2019 23:18   Recent Labs    02/22/19 0417 02/23/19 1007  WBC 4.4 3.6*  HGB 13.2 14.0  HCT 42.3 45.6   PLT 235 215   Recent Labs    02/22/19 0417 02/23/19 1007  NA 136 138  K 4.1 3.9  CL 100 103  CO2 21* 22  GLUCOSE 118* 113*  BUN 13 13  CREATININE 0.70 0.73  CALCIUM 9.1 9.2    Intake/Output Summary (Last 24 hours) at 02/24/2019 0950 Last data filed at 02/23/2019 1855 Gross per 24 hour  Intake 318 ml  Output -  Net 318 ml     Physical Exam: Vital Signs Blood pressure 132/84, pulse 79, temperature 98.9 F (37.2 C), temperature source Oral, resp. rate 14, weight 61.8 kg, SpO2 99 %. Constitutional: NAD.  Vital signs reviewed. HENT: Normocephalic.  Atraumatic.  Poor dentition. Eyes: EOMI.  No discharge. Cardiovascular: No JVD. Respiratory: Normal effort. GI: Non--distended. Musc: No edema or tenderness in extremities. Neurologic: Alert Facial weakness, stable Dysarthria, stable Motor:  0/5 LUE, stable 2/5 hip flexion, distally 0/5 LLE  Increased tone LUE/LLE, stable Skin: Warm and dry.  Intact. Psych: Slowed  Assessment/Plan: 1. Functional deficits secondary to RIght Basal ganglia infarct which require 3+ hours per day of interdisciplinary therapy in a comprehensive inpatient rehab setting.  Physiatrist is providing close team supervision and 24 hour management of active medical problems listed below.  Physiatrist and rehab team continue to assess barriers to discharge/monitor patient progress toward functional and medical goals  Care Tool:  Bathing    Body parts bathed by patient: Chest, Front perineal area, Abdomen, Right upper leg, Left upper leg, Left arm, Buttocks, Right arm, Face   Body parts bathed by helper: Right lower leg, Left lower leg     Bathing assist Assist Level: Minimal Assistance - Patient > 75%     Upper Body Dressing/Undressing Upper  body dressing Upper body dressing/undressing activity did not occur (including orthotics): Refused What is the patient wearing?: Pull over shirt    Upper body assist Assist Level: Moderate Assistance -  Patient 50 - 74%    Lower Body Dressing/Undressing Lower body dressing      What is the patient wearing?: Pants, Incontinence brief     Lower body assist Assist for lower body dressing: Moderate Assistance - Patient 50 - 74%     Toileting Toileting    Toileting assist Assist for toileting: Maximal Assistance - Patient 25 - 49%     Transfers Chair/bed transfer  Transfers assist     Chair/bed transfer assist level: Moderate Assistance - Patient 50 - 74%     Locomotion Ambulation   Ambulation assist   Ambulation activity did not occur: Safety/medical concerns  Assist level: Maximal Assistance - Patient 25 - 49% Assistive device: Walker-hemi(& ace wrap R LE) Max distance: 10'   Walk 10 feet activity   Assist  Walk 10 feet activity did not occur: Safety/medical concerns  Assist level: Maximal Assistance - Patient 25 - 49% Assistive device: Walker-hemi, Other (comment)(ace wrap R LE)   Walk 50 feet activity   Assist Walk 50 feet with 2 turns activity did not occur: Safety/medical concerns         Walk 150 feet activity   Assist Walk 150 feet activity did not occur: Safety/medical concerns         Walk 10 feet on uneven surface  activity   Assist Walk 10 feet on uneven surfaces activity did not occur: Safety/medical concerns         Wheelchair     Assist Will patient use wheelchair at discharge?: Yes Type of Wheelchair: Manual Wheelchair activity did not occur: Refused  Wheelchair assist level: Moderate Assistance - Patient 50 - 74% Max wheelchair distance: 34'    Wheelchair 50 feet with 2 turns activity    Assist    Wheelchair 50 feet with 2 turns activity did not occur: Refused   Assist Level: Moderate Assistance - Patient 50 - 74%   Wheelchair 150 feet activity     Assist Wheelchair 150 feet activity did not occur: Safety/medical concerns        Medical Problem List and Plan: 1.Left side weaknesssecondary  to multiple right brain infarcts adjacent to previous right MCA infarcts on 02/04/2019 in the setting of 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 Follow-up outpatient interventional radiology for ICA aneurysm  Continue CIR  WHO, PRAFO ordered for left side for spasticity/contracture mgt  Repeat head CT 4/1 reviewed, showing evolving infarct as well as chronic infarct, repeat head CT on 4/12 reviewed- stable infarct. 2. Antithrombotics: -DVT/anticoagulation:Subcutaneous Lovenox -antiplatelet therapy: Aspirin 325 mg daily 3. Pain Management:Lyrica 75 mg daily, Tylenol as needed  Topamax for headache started on 3/31, DC'd on 4/2 due to Lethargy-improving 4. Mood/bipolar disorder -antipsychotic agents: Risperdal 1 mg daily at bedtime 5. Neuropsych: This patientiscapable of making decisions on herown behalf. 6. Skin/Wound Care:Routine skin checks 7. Fluids/Electrolytes/Nutrition:Routine in and out's  8. Hypertension. Monitor with increased mobility. Patient on Norvasc 5 mg daily, Coreg 12.5 mg twice a day prior to admission. Resume as needed Vitals:   02/24/19 0509 02/24/19 0829  BP: 131/79 132/84  Pulse: 79   Resp:    Temp: 98.9 F (37.2 C)   SpO2: 99%    Norvasc 2.5 started on 3/31, increased to 5 on 4/2, increased to 7.5  on 4/13  Controlled on 4/14 9. Diastolic congestive heart failure. Monitor for any signs of fluid overload Filed Weights   02/21/19 0438 02/22/19 0337 02/23/19 0429  Weight: 63.1 kg 63.8 kg 61.8 kg   Stable on 4/14 10. History oftobacco/cocaine use. Provide counseling 11. Hyperlipidemia. Lipitor 12.  Constipation: Improving 13.  Spasticity hemiparesis  Tizanidine 2 mg twice daily started on 4/3, increased to 4 mg on 4/5, increased to 6 mg on 4/7, increased to 8 mg twice daily on 4/10  Baclofen 5 3 times daily started on 3/31, DC'd on 4/2 due to Lethargy  Will likely require Botox  injection for better spasticity control 14.  Prediabetes  Blood glucose slightly elevated on 4/13  Continue to monitor, will consider changing to carb modified diet if persistently elevated, will however will avoid at present to encourage nutritional intake 15.  Hypokalemia: Resolved  Potassium 3.9 on 4/13 16.  Hypoalbuminemia  Supplement initiated on 3/30 17.  Leukopenia:   WBCs 3.6 on 4/13  Continue to monitor 18.  Thrombocytopenia: Resolved  Platelets 173 on 4/2  Continue to monitor 19.  Hyponatremia: Resolved  Sodium 138 on 4/13  Continue to monitor 20.  Fever:  Resolved  Blood cultures no growth  UA negative, urine culture no growth  Chest x-ray reviewed, unremarkable for acute process 21.  Seizures  Continue Keppra-started by neurology  LOS: 18 days A FACE TO FACE EVALUATION WAS PERFORMED  Sihaam Chrobak Karis Juba 02/24/2019, 9:50 AM

## 2019-02-25 ENCOUNTER — Inpatient Hospital Stay (HOSPITAL_COMMUNITY): Payer: Medicare Other | Admitting: Physical Therapy

## 2019-02-25 ENCOUNTER — Inpatient Hospital Stay (HOSPITAL_COMMUNITY): Payer: Medicare Other

## 2019-02-25 ENCOUNTER — Inpatient Hospital Stay (HOSPITAL_COMMUNITY): Payer: Medicare Other | Admitting: Speech Pathology

## 2019-02-25 NOTE — Progress Notes (Signed)
Physical Therapy Session Note  Patient Details  Name: [name] MRN: [id number] Date of Birth: 1956/03/19  Today's Date: 02/25/2019 PT Individual Time: 0915-1000 PT Individual Time Calculation (min): 45 min   Short Term Goals: Week 3:  PT Short Term Goal 1 (Week 3): =LTGs due to ELOS  Skilled Therapeutic Interventions/Progress Updates:   Pt in supine and agreeable to therapy, no c/o pain. Supine>sit w/ mod assist and verbal/visual cues for technique. Sit<>stand w/ min assist and stand pivot transfer to w/c on pt's L side w/ min-mod assist overall and increased time. Max verbal and tactile cues for stepping w/ R foot to pivot and manual assist at pelvis for weight shifting. Total assist w/c transport to/from therapy gym. Upon reaching gym, pt noted to be incontinent of urine. Returned to room for [employer], sit<>stands w/ min assist w/ UE support on bed rail, pt able to maintain static stance for ~60 sec w/ min guard while therapist provided total assist for pericare and to change brief. Verbal and tactile cues throughout for sustained L quad activation in stance. Worked on blocked practice of transfers remainder of session. Stand pivot transfers to/from EOB, multiple reps in both directions w/ min-mod assist overall. Verbal and tactile cues for pivoting/stepping w/ R foot and total assist to move L foot, however pt maintaining adequate L quad contraction in single leg stance w/o assist from therapist. Mod assist to facilitate weight shifting at pelvis and guiding hips into seated position. Educated pt on importance of practicing this task for carryover to home environment and to decrease burden of care on boyfriend and caregiver. Pt verbalized understanding however refusing further treatment intervention, requesting to lay down. Returned to supine w/ mod assist. Returned to room and ended session in supine, all needs in reach. Missed 15 min of skilled PT 2/2 refusal.   Therapy  Documentation Precautions:  Precautions Precautions: Fall Restrictions Weight Bearing Restrictions: No  Therapy/Group: Individual Therapy  Tejas Seawood K Macayla Ekdahl 02/25/2019, 10:08 AM

## 2019-02-25 NOTE — Progress Notes (Signed)
Occupational Therapy Session Note  Patient Details  Name: [name] MRN: [id number] Date of Birth: 23-Aug-1956  Today's Date: 02/25/2019 OT Individual Time: 3568-6168 OT Individual Time Calculation (min): 55 min    Short Term Goals: Week 1:  OT Short Term Goal 1 (Week 1): Pt will perform toilet transfer with max A in order to decrease level of assist with functional transfers. OT Short Term Goal 1 - Progress (Week 1): Not met OT Short Term Goal 2 (Week 1): Pt will maintain dynamic balance during functional tasks seated EOB with min A overall. OT Short Term Goal 2 - Progress (Week 1): Met OT Short Term Goal 3 (Week 1): Pt will perform UB dressing with mod A in order to decrease level of assist with self care. OT Short Term Goal 3 - Progress (Week 1): Not met  Skilled Therapeutic Interventions/Progress Updates:    Pt received in w/c with no report of pain. Pt found to be wet in brief. Pt completes sit to stand with HEMI walker with mod A overall (1 stand at min A level) for hygiene/doffing soiled brief/donning new brief. Pt completes squat pivot transfers with MOD A overall to R with max VC for managing LLE during transfer. Pt completes postural control activities seated EOM 3x5 sit up and 3x5 lateral taps of R elbow to mat to activate L core with CGA fading to S for sitting balance at EOM. Pt completes 3 sit to stand at [facility] with L min A overall and mirror used for visual feedback during lateral weight shifting in prep for functional transfers. Pt demo decreased midline orientation requiring VC for use of mirror. Exited session with pt seated in w/c call lighin reach and all needs met  Therapy Documentation Precautions:  Precautions Precautions: Fall Restrictions Weight Bearing Restrictions: No General: General PT Missed Treatment Reason: Patient unwilling to participate Vital Signs: Therapy Vitals Temp: 98.5 F (36.9 C) Temp Source: Oral Pulse Rate: 83 Resp: 19 BP:  132/82 Patient Position (if appropriate): Sitting Oxygen Therapy SpO2: 100 % O2 Device: Room Air Pain:   ADL:   Vision   Perception    Praxis   Exercises:   Other Treatments:     Therapy/Group: Individual Therapy  Tonny Branch 02/25/2019, 3:28 PM

## 2019-02-25 NOTE — Plan of Care (Signed)
  Problem: Consults Goal: RH GENERAL PATIENT EDUCATION Description See Patient Education module for education specifics. Outcome: Progressing   Problem: RH BOWEL ELIMINATION Goal: RH STG MANAGE BOWEL WITH ASSISTANCE Description STG Manage Bowel with Assistance. Max assist  Outcome: Progressing Flowsheets (Taken 02/25/2019 1250) STG: Pt will manage bowels with assistance: 1-Total assistance   Problem: RH BLADDER ELIMINATION Goal: RH STG MANAGE BLADDER WITH ASSISTANCE Description STG Manage Bladder With Assistance. Max  Outcome: Progressing Flowsheets (Taken 02/25/2019 1250) STG: Pt will manage bladder with assistance: 1-Total assistance   Problem: RH SKIN INTEGRITY Goal: RH STG SKIN FREE OF INFECTION/BREAKDOWN Description Skin free of infection with max assist  Outcome: Progressing   Problem: RH SAFETY Goal: RH STG ADHERE TO SAFETY PRECAUTIONS W/ASSISTANCE/DEVICE Description STG Adhere to Safety Precautions With Assistance/Device. Mod assit  Outcome: Progressing Flowsheets (Taken 02/25/2019 1250) STG:Pt will adhere to safety precautions with assistance/device: 2-Maximum assistance   Problem: RH PAIN MANAGEMENT Goal: RH STG PAIN MANAGED AT OR BELOW PT'S PAIN GOAL Description Pain less than 3  Outcome: Progressing   Problem: RH KNOWLEDGE DEFICIT GENERAL Goal: RH STG INCREASE KNOWLEDGE OF SELF CARE AFTER HOSPITALIZATION Description Family/caregiver will be able to describe plan of care following hospitalization including medication management with handouts/cues  Outcome: Progressing

## 2019-02-25 NOTE — Progress Notes (Signed)
Physical Therapy Session Note  Patient Details  Name: [name] MRN: [id number] Date of Birth: 03-Oct-1956  Today's Date: 02/25/2019 PT Individual Time: 1130-1215 PT Individual Time Calculation (min): 45 min   Short Term Goals: Week 3:  PT Short Term Goal 1 (Week 3): =LTGs due to ELOS  Skilled Therapeutic Interventions/Progress Updates:    Pt received seated in bed, agreeable to PT session. No complaints of pain. Pt is max A to don pants. Supine to sit with mod A. Session focus on squat pivot transfers to the R. Pt initially requires max A for transfer progressing to mod A with increased time and cueing needed to perform transfer safely. Pt declines to participate any further after performing x 4 transfers. Pt left seated in w/c in room with needs in reach, quick release belt and chair alarm in place. Pt missed 15 min of therapy due to unwillingness to participate.  Therapy Documentation Precautions:  Precautions Precautions: Fall Restrictions Weight Bearing Restrictions: No General: PT Amount of Missed Time (min): 15 Minutes PT Missed Treatment Reason: Patient unwilling to participate    Therapy/Group: Individual Therapy   Peter Congo, PT, DPT  02/25/2019, 12:32 PM

## 2019-02-25 NOTE — Progress Notes (Signed)
Speech Language Pathology Daily Session Note  Patient Details  Name: SHOLANDA BAGNASCO MRN: [id number] Date of Birth: 06/01/1956  Today's Date: 02/25/2019 SLP Individual Time: 0845-0900 SLP Individual Time Calculation (min): 15 min and Today's Date: 02/25/2019 SLP Missed Time: 15 Minutes Missed Time Reason: Nursing care(getting IV replaced )  Short Term Goals: Week 3: SLP Short Term Goal 1 (Week 3): Patient will utilize speech intelligibility strategies at the sentence level with achieve ~90% intelligibility with Supervision verbal cues.  SLP Short Term Goal 2 (Week 3): Patient will demonstrate sustained attention to tasks for ~15 minutes with Mod A verbal cues.  SLP Short Term Goal 3 (Week 3): Patient will demonstrate functional problem solving for basic and familiar tasks with Mod A verbal cues.  SLP Short Term Goal 4 (Week 3): Patient will attend to left field of enviornment during functional tasks with Mod A verbal cues.  SLP Short Term Goal 5 (Week 3): Patient will respond to questions, etc in 75% of opportunities with Mod A verbal cues.   Skilled Therapeutic Interventions: Skilled treatment session focused on cognitive goals. SLP facilitated session by re-administering the MoCA-BLIND.  SLP was unable to complete test but patient scored 2/5 points in short-term recallsubtest, 4/6 points for orientation subtest, 3/3 points for both the calculation and abstract reasoning subtests. Test will be completed during next scheduled session.  Patient left upright in bed with all needs within reach and alarm on. Continue with current plan of care.      Pain No/Denies Pain   Therapy/Group: Individual Therapy  Shuntel Fishburn 02/25/2019, 12:29 PM

## 2019-02-25 NOTE — Progress Notes (Signed)
[place] PHYSICAL MEDICINE & REHABILITATION PROGRESS NOTE   Subjective/Complaints: Patient seen laying in bed this morning.  She states she slept well overnight.  She denies complaints.  Her tone is stable.  ROS: Denies CP, shortness of breath, nausea, vomiting, diarrhea.  Objective:   No results found. Recent Labs    02/23/19 1007  WBC 3.6*  HGB 14.0  HCT 45.6  PLT 215   Recent Labs    02/23/19 1007  NA 138  K 3.9  CL 103  CO2 22  GLUCOSE 113*  BUN 13  CREATININE 0.73  CALCIUM 9.2    Intake/Output Summary (Last 24 hours) at 02/25/2019 0951 Last data filed at 02/25/2019 0819 Gross per 24 hour  Intake 810 ml  Output -  Net 810 ml     Physical Exam: Vital Signs Blood pressure 121/70, pulse 83, temperature 99 F (37.2 C), temperature source Oral, resp. rate 14, weight 63 kg, SpO2 100 %. Constitutional: NAD.  Vital signs reviewed. HENT: Normocephalic.  Atraumatic.  Poor dentition. Eyes: EOMI.  No discharge. Cardiovascular: No JVD. Respiratory: Normal effort. GI: Non--distended. Musc: No edema or tenderness in extremities. Neurologic: Alert Facial weakness, unchanged Dysarthria, unchanged Motor:  0/5 LUE, stable 2/5 hip flexion, distally 0/5 LLE  Increased tone LUE/LLE, remains present, but improved overall Skin: Warm and dry.  Intact. Psych: Slowed  Assessment/Plan: 1. Functional deficits secondary to RIght Basal ganglia infarct which require 3+ hours per day of interdisciplinary therapy in a comprehensive inpatient rehab setting.  Physiatrist is providing close team supervision and 24 hour management of active medical problems listed below.  Physiatrist and rehab team continue to assess barriers to discharge/monitor patient progress toward functional and medical goals  Care Tool:  Bathing    Body parts bathed by patient: Chest, Front perineal area, Abdomen, Right upper leg, Left upper leg, Left arm, Buttocks, Right arm, Face   Body parts bathed  by helper: Right lower leg, Left lower leg     Bathing assist Assist Level: Minimal Assistance - Patient > 75%     Upper Body Dressing/Undressing Upper body dressing Upper body dressing/undressing activity did not occur (including orthotics): Refused What is the patient wearing?: Pull over shirt    Upper body assist Assist Level: Moderate Assistance - Patient 50 - 74%    Lower Body Dressing/Undressing Lower body dressing      What is the patient wearing?: Pants, Incontinence brief     Lower body assist Assist for lower body dressing: Moderate Assistance - Patient 50 - 74%     Toileting Toileting    Toileting assist Assist for toileting: Maximal Assistance - Patient 25 - 49%     Transfers Chair/bed transfer  Transfers assist     Chair/bed transfer assist level: Maximal Assistance - Patient 25 - 49%     Locomotion Ambulation   Ambulation assist   Ambulation activity did not occur: Safety/medical concerns  Assist level: Maximal Assistance - Patient 25 - 49% Assistive device: Other (comment)(rail in hallway) Max distance: 30'   Walk 10 feet activity   Assist  Walk 10 feet activity did not occur: Safety/medical concerns  Assist level: Maximal Assistance - Patient 25 - 49% Assistive device: Walker-hemi, Other (comment)(ace wrap R LE)   Walk 50 feet activity   Assist Walk 50 feet with 2 turns activity did not occur: Safety/medical concerns         Walk 150 feet activity   Assist Walk 150 feet activity did not occur:  Safety/medical concerns         Walk 10 feet on uneven surface  activity   Assist Walk 10 feet on uneven surfaces activity did not occur: Safety/medical concerns         Wheelchair     Assist Will patient use wheelchair at discharge?: Yes Type of Wheelchair: Manual Wheelchair activity did not occur: Refused  Wheelchair assist level: Moderate Assistance - Patient 50 - 74% Max wheelchair distance: 3550'    Wheelchair  50 feet with 2 turns activity    Assist    Wheelchair 50 feet with 2 turns activity did not occur: Refused   Assist Level: Moderate Assistance - Patient 50 - 74%   Wheelchair 150 feet activity     Assist Wheelchair 150 feet activity did not occur: Safety/medical concerns        Medical Problem List and Plan: 1.Left side weaknesssecondary to multiple right brain infarcts adjacent to previous right MCA infarcts on 02/04/2019 in the setting of 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 Follow-up outpatient interventional radiology for ICA aneurysm  Continue CIR  WHO, PRAFO ordered for left side for spasticity/contracture mgt  Repeat head CT 4/1 reviewed, showing evolving infarct as well as chronic infarct, repeat head CT on 4/12 reviewed- stable infarct.  Team conference today to discuss current and goals and coordination of care, home and environmental barriers, and discharge planning with nursing, case manager, and therapies.  2. Antithrombotics: -DVT/anticoagulation:Subcutaneous Lovenox -antiplatelet therapy: Aspirin 325 mg daily 3. Pain Management:Lyrica 75 mg daily, Tylenol as needed  Topamax for headache started on 3/31, DC'd on 4/2 due to Lethargy-improving 4. Mood/bipolar disorder -antipsychotic agents: Risperdal 1 mg daily at bedtime 5. Neuropsych: This patientiscapable of making decisions on herown behalf. 6. Skin/Wound Care:Routine skin checks 7. Fluids/Electrolytes/Nutrition:Routine in and out's  8. Hypertension. Monitor with increased mobility. Patient on Norvasc 5 mg daily, Coreg 12.5 mg twice a day prior to admission. Resume as needed Vitals:   02/24/19 1925 02/25/19 0320  BP: 132/70 121/70  Pulse: 78 83  Resp: 16 14  Temp: 98.3 F (36.8 C) 99 F (37.2 C)  SpO2: 97% 100%   Norvasc 2.5 started on 3/31, increased to 5 on 4/2, increased to 7.5 on 4/13  Controlled on 4/15 9.  Diastolic congestive heart failure. Monitor for any signs of fluid overload Filed Weights   02/22/19 0337 02/23/19 0429 02/25/19 0320  Weight: 63.8 kg 61.8 kg 63 kg   Stable on 4/15 10. History oftobacco/cocaine use. Provide counseling 11. Hyperlipidemia. Lipitor 12.  Constipation: Improving 13.  Spasticity hemiparesis  Tizanidine 2 mg twice daily started on 4/3, increased to 4 mg on 4/5, increased to 6 mg on 4/7, increased to 8 mg twice daily on 4/10  Baclofen 5 3 times daily started on 3/31, DC'd on 4/2 due to Lethargy  Will require botulinum toxin injection for better spasticity control 14.  Prediabetes  Blood glucose slightly elevated on 4/13  Continue to monitor, will consider changing to carb modified diet if persistently elevated, will however will avoid at present to encourage nutritional intake 15.  Hypokalemia: Resolved  Potassium 3.9 on 4/13 16.  Hypoalbuminemia  Supplement initiated on 3/30 17.  Leukopenia:   WBCs 3.6 on 4/13  Continue to monitor 18.  Thrombocytopenia: Resolved  Platelets 173 on 4/2  Continue to monitor 19.  Hyponatremia: Resolved  Sodium 138 on 4/13  Continue to monitor 20.  Fever:  Resolved  Blood cultures no  growth  UA negative, urine culture no growth  Chest x-ray reviewed, unremarkable for acute process 21.  Seizures  Continue Keppra-started by neurology, tolerating medication  LOS: 19 days A FACE TO FACE EVALUATION WAS PERFORMED   Karis Juba 02/25/2019, 9:51 AM

## 2019-02-26 ENCOUNTER — Inpatient Hospital Stay (HOSPITAL_COMMUNITY): Payer: Medicare Other | Admitting: Occupational Therapy

## 2019-02-26 ENCOUNTER — Inpatient Hospital Stay (HOSPITAL_COMMUNITY): Payer: Medicare Other | Admitting: Speech Pathology

## 2019-02-26 ENCOUNTER — Inpatient Hospital Stay (HOSPITAL_COMMUNITY): Payer: Medicare Other | Admitting: Physical Therapy

## 2019-02-26 ENCOUNTER — Inpatient Hospital Stay (HOSPITAL_COMMUNITY): Payer: Medicare Other

## 2019-02-26 NOTE — Patient Care Conference (Signed)
Inpatient RehabilitationTeam Conference and Plan of Care Update Date: 02/25/2019   Time: 2:30 PM    Patient Name: [name]      Medical Record Number: [id number]  Date of Birth: 1956-07-28 Sex: Female         Room/Bed: 4M10C/4M10C-01 Payor Info: Payor: [employer] MEDICARE / Plan: [facility] MEDICARE / Product Type: *No Product type* /    Admitting Diagnosis: CVA  Admit Date/Time:  02/06/2019  5:35 PM Admission Comments: No comment available   Primary Diagnosis:  <principal problem not specified> Principal Problem: <principal problem not specified>  Patient Active Problem List   Diagnosis Date Noted  . Seizures (HCC)   . FUO (fever of unknown origin)   . Hyponatremia   . Spastic hemiparesis (HCC)   . Vascular headache   . Hypertensive crisis   . Thrombocytopenia (HCC)   . Leukopenia   . Hypoalbuminemia due to protein-calorie malnutrition (HCC)   . Hypokalemia   . Prediabetes   . Spastic hemiplegia affecting nondominant side (HCC)   . Chronic diastolic congestive heart failure (HCC)   . Substance abuse (HCC)   . Essential hypertension   . Labile blood pressure   . Aneurysm, cerebral, nonruptured 02/06/2019  . Acute right MCA stroke (HCC) 02/06/2019  . 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 Right carotid artery occlusion 02/05/2019  . Left hemiparesis (HCC) 02/05/2019  . CVA (cerebral vascular accident) (HCC) 02/03/2019    Expected Discharge Date: SNF  Team Members Present: Physician leading conference: Dr. 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 Follow-up outpatient interventional radiology for ICA aneurysm  Improve mobility, tone  See above   Bowel/Bladder   Incontinent of B/B lbm 02/23/19  timed toileting   QS ,assess and prn , Moderate assist , incontinent care   Swallow/Nutrition/ Hydration             ADL's   Overall mod/max A, pending participation and fatigue. On tuesday, pt had a few mod A transfers and stood with min A at times.   Goals downgraded to Mod A  L NMR, transfers, dc planning, pt.family education, activity tolerance, self-care retraining   Mobility   remains mod-max overall, gait is non-functional at this time, stand pivot transfers are getting more consistent to min-mod assist today   mod assist overall  functional transfers, w/c mobility, discharge planning   Communication   Supervision   Supervision  Increased verbal responses   Safety/Cognition/ Behavioral Observations  Mod A   Mod A      Pain   PRN meds for headache  to monitor pain and offer prn pain medication with a goal of 2 out 10  QS assess and prn monitoring      Skin   no issues skin intact  Maintain skin integrity  QS and prn monitoring     Rehab Goals Patient on target to meet rehab goals: No Rehab Goals Revised: pt making very limited progress *See Care Plan and progress notes for long and short-term goals.     Barriers to  Discharge  Current Status/Progress Possible Resolutions Date Resolved   Physician    Medical stability;Decreased caregiver support     See above  Therapies, optimize spasticity meds, cont seizure meds      Nursing                  PT                    OT                  SLP                SW                Discharge Planning/Teaching Needs:   Plan now changed to SNF.    Team Discussion:  sz over the weekend and meds added;  Still with significant tone.  incont b/b.  Max assist overall with occasional mod A.  Max cues for safe transfers.  Mod/ max for  ADLs.  Slight increase in initiation.  Still mod/ max with cognition.  SW notes pt's boyfriend now request SNF as he does not feel family can provide care.  He still plans to attend family ed tomorrow afternoon.  Revisions to Treatment Plan:  Change in d/c plan to SNF    Continued Need for Acute Rehabilitation Level of Care: The patient requires daily medical management by a physician with specialized training in physical medicine and rehabilitation for the following conditions: Daily direction of a multidisciplinary physical rehabilitation program to ensure safe treatment while eliciting the highest outcome that is of practical value to the patient.: Yes Daily medical management of patient stability for increased activity during participation in an intensive rehabilitation regime.: Yes Daily analysis of laboratory values and/or radiology reports with any subsequent need for medication adjustment of medical intervention for : Other;Neurological problems   I attest that I was present, lead the team conference, and concur with the assessment and plan of the team.   Amada JupiterHOYLE, Adalbert Alberto 02/26/2019, 9:23 AM   Team conference was held via web/ teleconference due to COVID - 19

## 2019-02-26 NOTE — NC FL2 (Signed)
[place] MEDICAID [id number] LEVEL OF CARE SCREENING TOOL     IDENTIFICATION  Patient Name: [name] Birthdate: 1956-05-23 Sex: female Admission Date (Current Location): 02/06/2019  [place] and [place] Number:  [address] [id number] N Facility and Address:  [facility], [address] [place], [place] [address]      Provider Number: [id number]  Attending Physician Name and Address:  Marcello Fennel, MD  Relative Name and Phone Number:       Current Level of Care: Other (Comment)(Acute Inpatient Rehab) Recommended Level of Care: Skilled Nursing Facility Prior Approval Number:    Date Approved/Denied:   PASRR Number: [id number]  Discharge Plan: SNF    Current Diagnoses: Patient Active Problem List   Diagnosis Date Noted  . Seizures (HCC)   . FUO (fever of unknown origin)   . Hyponatremia   . Spastic hemiparesis (HCC)   . Vascular headache   . Hypertensive crisis   . Thrombocytopenia (HCC)   . Leukopenia   . Hypoalbuminemia due to protein-calorie malnutrition (HCC)   . Hypokalemia   . Prediabetes   . Spastic hemiplegia affecting nondominant side (HCC)   . Chronic diastolic congestive heart failure (HCC)   . Substance abuse (HCC)   . Essential hypertension   . Labile blood pressure   . Aneurysm, cerebral, nonruptured 02/06/2019  . Acute right MCA stroke (HCC) 02/06/2019  . 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 Right carotid artery occlusion 02/05/2019  . Left hemiparesis (HCC) 02/05/2019  . CVA (cerebral vascular accident) (HCC) 02/03/2019    Orientation RESPIRATION BLADDER Height & Weight     Self, Place  Normal Incontinent Weight: 138 lb 14.2 oz (63 kg) Height:     BEHAVIORAL SYMPTOMS/MOOD NEUROLOGICAL BOWEL NUTRITION STATUS    Convulsions/Seizures Incontinent Diet(regular; thin liquids)  AMBULATORY STATUS COMMUNICATION OF NEEDS Skin   Extensive Assist Verbally Normal                       Personal Care Assistance  Level of Assistance  Bathing, Dressing Bathing Assistance: Maximum assistance Feeding assistance: Independent Dressing Assistance: Maximum assistance     Functional Limitations Info  Speech Sight Info: Adequate Hearing Info: Adequate Speech Info: Impaired    SPECIAL CARE FACTORS FREQUENCY  PT (By licensed PT), OT (By licensed OT), Speech therapy     PT Frequency: 5x/wk OT Frequency: 5x/wk     Speech Therapy Frequency: 5x/wk      Contractures Contractures Info: Not present    Additional Factors Info  Code Status, Allergies Code Status Info: full Allergies Info: NKDA           Current Medications (02/26/2019):  This is the current hospital active medication list Current Facility-Administered Medications  Medication Dose Route Frequency Provider Last Rate Last Dose  . acetaminophen (TYLENOL) tablet 650 mg  650 mg Oral Q4H PRN Charlton Amor, PA-C   650 mg at 02/21/19 2349   Or  . acetaminophen (TYLENOL) solution 650 mg  650 mg Per Tube Q4H PRN Angiulli, Mcarthur Rossetti, PA-C       Or  . acetaminophen (TYLENOL) suppository 650 mg  650 mg Rectal Q4H PRN Angiulli, Mcarthur Rossetti, PA-C      . amLODipine (NORVASC) tablet 7.5 mg  7.5 mg Oral Daily Marcello Fennel, MD   7.5 mg at 02/26/19 3013  . aspirin suppository 300 mg  300 mg Rectal Daily Angiulli, Mcarthur Rossetti, PA-C  Or  . aspirin tablet 325 mg  325 mg Oral Daily Charlton Amorngiulli, Daniel J, PA-C   325 mg at 02/26/19 [id number]  . atorvastatin (LIPITOR) tablet 40 mg  40 mg Oral q1800 Charlton Amorngiulli, Daniel J, PA-C   40 mg at 02/25/19 1724  . enoxaparin (LOVENOX) injection 40 mg  40 mg Subcutaneous q1800 Charlton Amorngiulli, Daniel J, PA-C   40 mg at 02/25/19 1724  . feeding supplement (PRO-STAT SUGAR FREE 64) liquid 30 mL  30 mL Oral BID Marcello FennelPatel, Ankit Anil, MD   30 mL at 02/26/19 0919  . levETIRAcetam (KEPPRA) tablet 750 mg  750 mg Oral BID Aroor, Dara LordsSushanth R, MD   750 mg at 02/26/19 0918  . LORazepam (ATIVAN) injection 1 mg  1 mg Intravenous Q6H PRN Plotnikov,  Georgina QuintAleksei V, MD      . pantoprazole (PROTONIX) EC tablet 40 mg  40 mg Oral Daily Charlton Amorngiulli, Daniel J, PA-C   40 mg at 02/26/19 [id number]  . pregabalin (LYRICA) capsule 75 mg  75 mg Oral Daily Charlton Amorngiulli, Daniel J, PA-C   75 mg at 02/26/19 [id number]  . risperiDONE (RISPERDAL) tablet 1 mg  1 mg Oral QHS AngiulliMcarthur Rossetti, Daniel J, PA-C   1 mg at 02/25/19 2132  . senna-docusate (Senokot-S) tablet 1 tablet  1 tablet Oral BID Erick ColaceKirsteins, Andrew E, MD   1 tablet at 02/26/19 0919  . sorbitol 70 % solution 30 mL  30 mL Oral Daily PRN Angiulli, Mcarthur Rossettianiel J, PA-C      . tiZANidine (ZANAFLEX) tablet 8 mg  8 mg Oral BID Marcello FennelPatel, Ankit Anil, MD   8 mg at 02/26/19 [id number]     Discharge Medications: Please see discharge summary for a list of discharge medications.  Relevant Imaging Results:  Relevant Lab Results:   Additional Information SS#: [id number]  Amada JupiterHOYLE, Rockne Dearinger, LCSW

## 2019-02-26 NOTE — Progress Notes (Signed)
[name] PHYSICAL MEDICINE & REHABILITATION PROGRESS NOTE   Subjective/Complaints: Patient seen sitting up in bed this morning eating breakfast.  She states she slept well overnight.  She denies complaints.  ROS: Denies CP, shortness of breath, nausea, vomiting, diarrhea.  Objective:   No results found. Recent Labs    02/23/19 1007  WBC 3.6*  HGB 14.0  HCT 45.6  PLT 215   Recent Labs    02/23/19 1007  NA 138  K 3.9  CL 103  CO2 22  GLUCOSE 113*  BUN 13  CREATININE 0.73  CALCIUM 9.2    Intake/Output Summary (Last 24 hours) at 02/26/2019 0959 Last data filed at 02/26/2019 0800 Gross per 24 hour  Intake 580 ml  Output -  Net 580 ml     Physical Exam: Vital Signs Blood pressure 131/82, pulse 75, temperature 98.2 F (36.8 C), temperature source Oral, resp. rate 18, weight 63 kg, SpO2 99 %. Constitutional: NAD.  Vital signs reviewed. HENT: Normocephalic.  Atraumatic.  Poor dentition. Eyes: EOMI.  No discharge. Cardiovascular: No JVD. Respiratory: Normal effort. GI: Non--distended. Musc: No edema or tenderness in extremities. Neurologic: Alert Facial weakness, stable Dysarthria, stable Motor:  0/5 LUE, stable 2/5 hip flexion, distally 0/5 LLE  Increased tone LUE/LLE, stable Skin: Warm and dry.  Intact. Psych: Slowed  Assessment/Plan: 1. Functional deficits secondary to RIght Basal ganglia infarct which require 3+ hours per day of interdisciplinary therapy in a comprehensive inpatient rehab setting.  Physiatrist is providing close team supervision and 24 hour management of active medical problems listed below.  Physiatrist and rehab team continue to assess barriers to discharge/monitor patient progress toward functional and medical goals  Care Tool:  Bathing    Body parts bathed by patient: Chest, Front perineal area, Abdomen, Right upper leg, Left upper leg, Left arm, Buttocks, Right arm, Face   Body parts bathed by helper: Right lower leg, Left  lower leg     Bathing assist Assist Level: Minimal Assistance - Patient > 75%     Upper Body Dressing/Undressing Upper body dressing Upper body dressing/undressing activity did not occur (including orthotics): Refused What is the patient wearing?: Pull over shirt    Upper body assist Assist Level: Moderate Assistance - Patient 50 - 74%    Lower Body Dressing/Undressing Lower body dressing      What is the patient wearing?: Pants, Incontinence brief     Lower body assist Assist for lower body dressing: Moderate Assistance - Patient 50 - 74%     Toileting Toileting    Toileting assist Assist for toileting: Maximal Assistance - Patient 25 - 49%     Transfers Chair/bed transfer  Transfers assist     Chair/bed transfer assist level: Maximal Assistance - Patient 25 - 49%     Locomotion Ambulation   Ambulation assist   Ambulation activity did not occur: Safety/medical concerns  Assist level: Maximal Assistance - Patient 25 - 49% Assistive device: Other (comment)(rail in hallway) Max distance: 30'   Walk 10 feet activity   Assist  Walk 10 feet activity did not occur: Safety/medical concerns  Assist level: Maximal Assistance - Patient 25 - 49% Assistive device: Walker-hemi, Other (comment)(ace wrap R LE)   Walk 50 feet activity   Assist Walk 50 feet with 2 turns activity did not occur: Safety/medical concerns         Walk 150 feet activity   Assist Walk 150 feet activity did not occur: Safety/medical concerns  Walk 10 feet on uneven surface  activity   Assist Walk 10 feet on uneven surfaces activity did not occur: Safety/medical concerns         Wheelchair     Assist Will patient use wheelchair at discharge?: Yes Type of Wheelchair: Manual Wheelchair activity did not occur: Refused  Wheelchair assist level: Moderate Assistance - Patient 50 - 74% Max wheelchair distance: 9'    Wheelchair 50 feet with 2 turns  activity    Assist    Wheelchair 50 feet with 2 turns activity did not occur: Refused   Assist Level: Moderate Assistance - Patient 50 - 74%   Wheelchair 150 feet activity     Assist Wheelchair 150 feet activity did not occur: Safety/medical concerns        Medical Problem List and Plan: 1.Left side weaknesssecondary to multiple right brain infarcts adjacent to previous right MCA infarcts on 02/04/2019 in the setting of 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 Follow-up outpatient interventional radiology for ICA aneurysm  Continue CIR, now plan for SNF  WHO, PRAFO ordered for left side for spasticity/contracture mgt  Repeat head CT 4/1 reviewed, showing evolving infarct as well as chronic infarct, repeat head CT on 4/12 reviewed- stable infarct. 2. Antithrombotics: -DVT/anticoagulation:Subcutaneous Lovenox -antiplatelet therapy: Aspirin 325 mg daily 3. Pain Management:Lyrica 75 mg daily, Tylenol as needed  Topamax for headache started on 3/31, DC'd on 4/2 due to Lethargy-improving 4. Mood/bipolar disorder -antipsychotic agents: Risperdal 1 mg daily at bedtime 5. Neuropsych: This patientiscapable of making decisions on herown behalf. 6. Skin/Wound Care:Routine skin checks 7. Fluids/Electrolytes/Nutrition:Routine in and out's  8. Hypertension. Monitor with increased mobility. Patient on Norvasc 5 mg daily, Coreg 12.5 mg twice a day prior to admission. Resume as needed Vitals:   02/25/19 2020 02/26/19 0345  BP: (!) 141/87 131/82  Pulse: 87 75  Resp: 18 18  Temp: 98.7 F (37.1 C) 98.2 F (36.8 C)  SpO2: 100% 99%   Norvasc 2.5 started on 3/31, increased to 5 on 4/2, increased to 7.5 on 4/13  Relatively controlled on 4/16 9. Diastolic congestive heart failure. Monitor for any signs of fluid overload Filed Weights   02/22/19 0337 02/23/19 0429 02/25/19 0320  Weight: 63.8 kg 61.8 kg 63  kg   Stable on 4/16 10. History oftobacco/cocaine use. Provide counseling 11. Hyperlipidemia. Lipitor 12.  Constipation: Improving 13.  Spasticity hemiparesis  Tizanidine 2 mg twice daily started on 4/3, increased to 4 mg on 4/5, increased to 6 mg on 4/7, increased to 8 mg twice daily on 4/10  Baclofen 5 3 times daily started on 3/31, DC'd on 4/2 due to Lethargy  Will require botulinum toxin injection for better spasticity control 14.  Prediabetes  Blood glucose slightly elevated on 4/13  Continue to monitor, will consider changing to carb modified diet if persistently elevated, will however will avoid at present to encourage nutritional intake 15.  Hypokalemia: Resolved  Potassium 3.9 on 4/13 16.  Hypoalbuminemia  Supplement initiated on 3/30 17.  Leukopenia:   WBCs 3.6 on 4/13  Continue to monitor 18.  Thrombocytopenia: Resolved  Platelets 173 on 4/2  Continue to monitor 19.  Hyponatremia: Resolved  Sodium 138 on 4/13  Continue to monitor 20.  Fever:  Resolved  Blood cultures no growth  UA negative, urine culture no growth  Chest x-ray reviewed, unremarkable for acute process 21.  Seizures  Continue Keppra-started by neurology, tolerating medication  LOS: 20 days A  FACE TO FACE EVALUATION WAS PERFORMED  Hazley Dezeeuw Karis Juba 02/26/2019, 9:59 AM

## 2019-02-26 NOTE — Progress Notes (Signed)
Speech Language Pathology Weekly Progress and Session Note  Patient Details  Name: [name] MRN: [id number] Date of Birth: 21-Nov-1955  Beginning of progress report period: February 19, 2019 End of progress report period: February 26, 2019  Today's Date: 02/26/2019 SLP Individual Time: 1415-1455 SLP Individual Time Calculation (min): 40 min  Short Term Goals: Week 3: SLP Short Term Goal 1 (Week 3): Patient will utilize speech intelligibility strategies at the sentence level with achieve ~90% intelligibility with Supervision verbal cues.  SLP Short Term Goal 1 - Progress (Week 3): Met SLP Short Term Goal 2 (Week 3): Patient will demonstrate sustained attention to tasks for ~15 minutes with Mod A verbal cues.  SLP Short Term Goal 2 - Progress (Week 3): Not met SLP Short Term Goal 3 (Week 3): Patient will demonstrate functional problem solving for basic and familiar tasks with Mod A verbal cues.  SLP Short Term Goal 3 - Progress (Week 3): Not met SLP Short Term Goal 4 (Week 3): Patient will attend to left field of enviornment during functional tasks with Mod A verbal cues.  SLP Short Term Goal 4 - Progress (Week 3): Not met SLP Short Term Goal 5 (Week 3): Patient will respond to questions, etc in 75% of opportunities with Mod A verbal cues.  SLP Short Term Goal 5 - Progress (Week 3): Not met    New Short Term Goals: Week 4: SLP Short Term Goal 1 (Week 4): STGs=LTGs due to length of stay   Weekly Progress Updates: Patient has made minimal gains and has met 1 of 5 STGs this reporting period. Currently, patient requires supervision level verbal cues to achieve 90% intelligibility at the sentence level and overall Max A multimodal cues to complete functional and familiar tasks safely in regards to attention, problem solving, recall and awareness. Patient and family education ongoing. Patient's significant other is unable to provide the necessary physical assistance needed at this time, therefore,  patient's d/c plan has changed to SNF. Patient would benefit from continued skilled SLP intervention to maximize her cognitive functioning prior to discharge.      Intensity: Minumum of 1-2 x/day, 30 to 90 minutes Frequency: 3 to 5 out of 7 days Duration/Length of Stay: TBD due to SNF placement  Treatment/Interventions: Cueing hierarchy;Functional tasks;Patient/family education;Therapeutic Activities;Cognitive remediation/compensation;Environmental controls;Internal/external aids;Speech/Language facilitation   Daily Session  Skilled Therapeutic Interventions: Skilled treatment session focused on cognitive goals. Upon arrival, patient was sitting upright in the wheelchair but appeared lethargic. SLP facilitated session by providing Max A verbal cues for attention to basic card task for ~60 minute intervals. Patient verbally responded to 75% of opportunities and required Max A verbal cues to recall events from previous therapy session. Patient left upright in wheelchair with all needs within reach and alarm on. Continue with current plan of ca re.     Pain No/Denies Pain   Therapy/Group: Individual Therapy  Azya Barbero 02/26/2019, 3:03 PM

## 2019-02-26 NOTE — Progress Notes (Signed)
Social Work Patient ID: [name], female   DOB: 1956/10/09, 63 y.o.   MRN: [id number]  Reviewed team conference with pt's boyfriend and discussed d/c plans.  He states that he cannot meet her current assistance needs and does not feel the home is wheelchair accessible (and pt would need to get to 2nd floor for bedroom).  He requests this SW pursue SNF at this point and hopeful she will continue to make gains and be able to return to the home.  Will begin bed search.  [name], LCSW

## 2019-02-26 NOTE — Progress Notes (Signed)
Speech Language Pathology Daily Session Note  Patient Details  Name: [name] MRN: [id number] Date of Birth: May 18, 1956  Today's Date: 02/26/2019 SLP Individual Time: 1030-1052 SLP Individual Time Calculation (min): 22 min  Short Term Goals: Week 3: SLP Short Term Goal 1 (Week 3): Patient will utilize speech intelligibility strategies at the sentence level with achieve ~90% intelligibility with Supervision verbal cues.  SLP Short Term Goal 2 (Week 3): Patient will demonstrate sustained attention to tasks for ~15 minutes with Mod A verbal cues.  SLP Short Term Goal 3 (Week 3): Patient will demonstrate functional problem solving for basic and familiar tasks with Mod A verbal cues.  SLP Short Term Goal 4 (Week 3): Patient will attend to left field of enviornment during functional tasks with Mod A verbal cues.  SLP Short Term Goal 5 (Week 3): Patient will respond to questions, etc in 75% of opportunities with Mod A verbal cues.   Skilled Therapeutic Interventions: Pt was seen for skilled ST intervention targeting goals for improved cognition. Pt had poor participation in session today, with max verbal and tactile cues utilized to encourage alertness and cooperation. SLP facilitated session by asking orientation questions. Pt stated year as "22", but was correct for the month. Pt reported reason for hospitalization was due to frequent falls. When asked about what she is working on in therapy, pt stated "sitting and getting up".  Pt then said "I want you to leave me alone", closed her eyes, and did not verbalize again despite encouragement. Pt was left in bed with alarm on, needs within reach. Continue with current plan of care.  Pain Pain Assessment Pain Scale: Faces Faces Pain Scale: No hurt  Therapy/Group: Individual Therapy   Celia B. Murvin Natal, [facility], CCC-SLP Speech Language Pathologist   Leigh Aurora 02/26/2019, 10:56 AM

## 2019-02-26 NOTE — Progress Notes (Signed)
Physical Therapy Session Note  Patient Details  Name: [name] MRN: [id number] Date of Birth: 08-Jun-1956  Today's Date: 02/26/2019 PT Individual Time: 1500-1520 PT Individual Time Calculation (min): 20 min   Short Term Goals: Week 3:  PT Short Term Goal 1 (Week 3): =LTGs due to ELOS  Skilled Therapeutic Interventions/Progress Updates:   Pt in w/c and agreeable to therapy w/ encouragement. Boyfriend not present for scheduled PT education session. Worked on sit<>stands to [employer] this session and NMR. Sit<>stands w/ min-mod assist w/ max multimodal cues for initiation, forward weight shifting, and quad activation to achieve terminal knee extension in stance. NMR w/ RLE forward and backward stepping, no assist needed to maintain L knee extension in single leg stance this session. Pt requesting to return to supine, minimally engaging with this therapist despite encouragement. Stand pivot transfer w/ min assist physically, continues to need max verbal/tactile cues for initiation and weight shifting. Ended session in supine, all needs in reach.  Therapy Documentation Precautions:  Precautions Precautions: Fall Restrictions Weight Bearing Restrictions: No  Pain:    Therapy/Group: Individual Therapy  [name] Melton Krebs 02/26/2019, 3:32 PM

## 2019-02-26 NOTE — Progress Notes (Signed)
Occupational Therapy Session Note  Patient Details  Name: [name] MRN: [id number] Date of Birth: 12-04-1955  Today's Date: 02/26/2019  Session 1 OT Individual Time: 1050-1200 OT Individual Time Calculation (min): 70 min   Session 2 OT Individual Time: 1345-1412 OT Individual Time Calculation (min): 27 min    Short Term Goals: Week 3:  OT Short Term Goal 1 (Week 3): STG=LTG 2/2 ELOS  Skilled Therapeutic Interventions/Progress Updates:  Session 1   Pt greeted semi-reclined in bed asleep, difficult to wake initially, but eventually pt able to open her eyes and agreeable to participate. Although verbally agreeable, pt had difficulty maintaining alertness to initiate sitting EOB. Pt needed max A to sit EOB and min A for sitting balance. OT noted new skin abrasion on L UE and called nursing to come check skin. Per nursing, pt received increased dose of xanaflex today, which could be causing her lethargy. Pt's L UE also noted to have considerably less tone today. Pt needed total A for squat-pivot transfer today to stronger R side 2.2 lethargy. Pt more awake after transfer and agreeable to shower. Stedy used to transfer pt into shower for safety. Pt able to initiate bathing tasks and completed bathing with min A. Utilized cut out in commode seat to wash peri-area.  Stedy transfer out of shower with min A to stand. Dressing completed wc at the sink with pt usually able to at least thread R LE into pants, but today, needed max/total A for LB dressing. Attempted sit<>stands from wc with hemi walker and pt unable to achieve hip/trunk extensions for full stand, pt more alert, but slow to process commands. Pt left seated in wc with alarm belt on and pillows under UEs for support. \  Session 2 OT treatment session focused on pt/family education. Pt's boyfriend Suezanne Jacquet present. OT discussed patient progress within BADL tasks. OT expressed that patient has had a great past few days and has made progress,  but pt has been very lethargic today 2/2 medication modifications. Practiced sit<>stands with hemi-walker with pt needing mod/max A initially, progressing to min A. OT encouraged SO to practice sit<>stand with pt,  But he declined despite encouragement. OT asked if she could answer any questions for SO, and he said "no, I have seen all I need to see." Encouraged pt's SO to stay for speech therapy coming any minute. SO stated he could not stay. Pt left seated in wc with safety belt on and needs met awaiting next therapy.   Therapy Documentation Precautions:  Precautions Precautions: Fall Restrictions Weight Bearing Restrictions: No Pain: Pain Assessment Pain Scale: Faces Faces Pain Scale: No hurt   Therapy/Group: Individual Therapy  Valma Cava 02/26/2019, 2:29 PM

## 2019-02-27 ENCOUNTER — Inpatient Hospital Stay (HOSPITAL_COMMUNITY): Payer: Medicare Other | Admitting: Occupational Therapy

## 2019-02-27 ENCOUNTER — Inpatient Hospital Stay (HOSPITAL_COMMUNITY): Payer: Medicare Other | Admitting: Physical Therapy

## 2019-02-27 ENCOUNTER — Inpatient Hospital Stay (HOSPITAL_COMMUNITY): Payer: Medicare Other

## 2019-02-27 ENCOUNTER — Inpatient Hospital Stay (HOSPITAL_COMMUNITY): Payer: Medicare Other | Admitting: Speech Pathology

## 2019-02-27 NOTE — Progress Notes (Signed)
Occupational Therapy Session Note  Patient Details  Name: [name] MRN: [id number] Date of Birth: 03/31/1956  Today's Date: 02/27/2019 OT Individual Time: 1000-1045 OT Individual Time Calculation (min): 45 min    Skilled Therapeutic Interventions/Progress Updates:     1:1. Pt extremely lethargic throughout session barely maintaining eyes open >30sec. Pt does not report pain. Tone in LUE much improved. RN reporting medication side effect causing lethargy. Pt following commands to roll to change brief in bed with MIN A. Pt supine>sitting with MAX A and mildly improved arousal. Pt dons pants total A and sit to stand with MAX A in stooped posture. Squat pivot transfer MAX A to w/c. Pt completes 3 sit to stand with MAX A using hemi walker with mirror for posture awareness and facilitation at chest/hips for extension. On third stand pt still maintaing weight in LE with MOD A, however falling asleep/closing eyes. Pt returned to room and transferred back to bed as stated above with call lighitn reach, exit alarm on and all needs met.  Therapy Documentation Precautions:  Precautions Precautions: Fall Restrictions Weight Bearing Restrictions: No General: General OT Amount of Missed Time: 15 Minutes Vital Signs:  Pain:   ADL:   Vision   Perception    Praxis   Exercises:   Other Treatments:     Therapy/Group: Individual Therapy  Tonny Branch 02/27/2019, 10:51 AM

## 2019-02-27 NOTE — Progress Notes (Signed)
Speech Language Pathology Daily Session Note  Patient Details  Name: [name] MRN: [id number] Date of Birth: June 11, 1956  Today's Date: 02/27/2019 SLP Individual Time: 1430-1440 SLP Individual Time Calculation (min): 10 min and Today's Date: 02/27/2019 SLP Missed Time: 20 Minutes Missed Time Reason: Patient fatigue  Short Term Goals: Week 4: SLP Short Term Goal 1 (Week 4): STGs=LTGs due to length of stay   Skilled Therapeutic Interventions: Skilled treatment session focused on cognitive goals. Upon arrival, patient was asleep in the recliner but was easily awakened. Patient unable to recall events from previous therapy sessions but reported, "I thought I was going home today." Re-educated on change of discharge plan to SNF (discussed yesterday with SW). She verbalized understanding but then would not participate further in conversation and kept her eyes closed, therefore, session ended early. Patient left in recliner with alarm on and all needs within reach. Continue with current plan of care.      Pain No/Denies Pain   Therapy/Group: Individual Therapy  Eylin Pontarelli 02/27/2019, 3:06 PM

## 2019-02-27 NOTE — Progress Notes (Signed)
Physical Therapy Session Note  Patient Details  Name: [name] MRN: [id number] Date of Birth: May 21, 1956  Today's Date: 02/27/2019 PT Individual Time: 1200-1229 PT Individual Time Calculation (min): 29 min   Short Term Goals: Week 3:  PT Short Term Goal 1 (Week 3): =LTGs due to ELOS  Skilled Therapeutic Interventions/Progress Updates:   Pt asleep in supine, max auditory stimulation to wake up. Once woken up, pt able to follow commands w/ increased time and answer yes/no questions w/ increased time throughout session, however eyes remained closet and pt providing limited assistance. Donned pants and shirt w/ total assist, pt able to initiate bridging at hips and managing RUE in/out of shirt. Donned pt's clothes from home in attempts to increase pt's participation and self-efficacy. Dependent supine>sit transfer. Pt requesting to sit OOB, stedy transfer to recliner w/ min assist to stand. Once at EOB and in standing, pt opening eyes slightly more often. Total assist to reposition in recliner for upright tolerance and safety. Chair alarm belt threaded through armrests to prevent it from going up over pt's chest. Ended session in recliner, all needs in reach. Made NT aware of pt's position/status.  Therapy Documentation Precautions:  Precautions Precautions: Fall Restrictions Weight Bearing Restrictions: No  Therapy/Group: Individual Therapy  Donovin Kraemer K Axelle Szwed 02/27/2019, 12:30 PM

## 2019-02-27 NOTE — Progress Notes (Signed)
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 Patient to discharge at a wheelchair level Mod Assist both for propulsion and transfers, gait remains non-functional at this time.   Patient's care partner unavailable to provide the necessary physical and cognitive assistance at discharge, therefore pt's boyfriend has chosen to pursue SNF placement.   Reasons goals not met: See above  Recommendation:  Patient will benefit from ongoing skilled PT services in skilled nursing facility setting to continue to advance safe functional mobility, address ongoing impairments in functional LLE strength, increased LLE tone, functional balance, L attention, initiation and motor planning of functional tasks, and minimize fall risk.  Equipment: No equipment provided  Reasons for discharge: discharge from hospital  Patient/family agrees with progress made and goals achieved: Yes  PT Discharge Precautions/Restrictions Precautions Precautions: Fall Precaution Comments: LUE flexor tone, LLE extensor tone Restrictions Weight Bearing Restrictions: No Vital Signs  Pain Pain Assessment Pain Scale: 0-10 Pain Score: 0-No pain Vision/Perception  Vision - Assessment Eye Alignment: Within Functional Limits Alignment/Gaze Preference: Gaze right Tracking/Visual Pursuits: Decreased smoothness of horizontal tracking;Decreased smoothness of eye movement to LEFT inferior field;Decreased smoothness of eye movement to LEFT superior field Saccades: Decreased speed of saccadic movement Perception Perception: Impaired Inattention/Neglect: Does not attend to left side of body;Does not attend to left  visual field Praxis Praxis: Impaired Praxis Impairment Details: Ideomotor  Cognition Overall Cognitive Status: History of cognitive impairments - at baseline Arousal/Alertness: Awake/alert Orientation Level: Oriented to person;Oriented to place;Disoriented to time;Oriented to situation Attention: Sustained Focused Attention: Appears intact Sustained Attention: Impaired Sustained Attention Impairment: Functional complex Memory: Impaired Memory Impairment: Retrieval deficit;Decreased recall of new information Awareness: Impaired Awareness Impairment: Emergent impairment Problem Solving: Impaired Problem Solving Impairment: Functional basic Executive Function: Self Monitoring Self Monitoring: Impaired Self Monitoring Impairment: Functional complex;Verbal complex Safety/Judgment: Impaired Sensation Sensation Light Touch: Impaired Detail Central sensation comments: Unable to accurately identify any light touch on L UE or L LE Light Touch Impaired Details: Impaired LLE;Impaired LUE Proprioception: Impaired by gross assessment Coordination Gross Motor Movements are Fluid and Coordinated: No Fine Motor Movements are Fluid and Coordinated: No Coordination and Movement Description: Dense L hemiplegia UE and LE  Motor  Motor Motor: Hemiplegia;Abnormal tone Motor - Discharge Observations: L hemi with tone in L UE  Mobility Bed Mobility Bed Mobility: Supine to Sit;Sit to Supine Supine to Sit: Moderate Assistance - Patient 50-74% Sit to Supine: Moderate Assistance - Patient 50-74% Transfers Transfers: Sit to Stand;Stand Pivot Transfers Sit to Stand: Minimal Assistance - Patient > 75% Stand Pivot Transfers: Moderate Assistance - Patient 50 - 74% Stand Pivot Transfer Details: Verbal cues for sequencing;Verbal cues for precautions/safety;Verbal cues for safe use of DME/AE Transfer (Assistive device): Hemi-walker Locomotion  Gait Ambulation: Yes Gait Assistance: Moderate Assistance -  Patient 50-74% Gait Distance (Feet): 10 Feet Assistive device: Other (Comment)(wall rail) Gait Assistance Details: Tactile cues for initiation;Tactile cues for sequencing;Tactile cues for weight shifting;Tactile cues for posture;Tactile cues for placement;Tactile cues for weight beaing;Manual facilitation for weight shifting;Verbal cues for gait pattern;Manual facilitation for placement;Manual facilitation for weight bearing  Trunk/Postural Assessment  Cervical Assessment Cervical Assessment: Exceptions to WFL(forward head) Thoracic Assessment Thoracic Assessment: Exceptions to WFL(rounded shoulders) Lumbar Assessment Lumbar Assessment:  Exceptions to WFL(posterior pelvic tilt) Postural Control Postural Control: Deficits on evaluation  Balance Balance Balance Assessed: Yes Static Sitting Balance Static Sitting - Balance Support: Feet supported Static Sitting - Level of Assistance: 4: Min assist Static Sitting - Comment/# of Minutes: Requires constant cueing for midline orientation Dynamic Sitting Balance Dynamic Sitting - Balance Support: During functional activity Dynamic Sitting - Level of Assistance: 4: Min assist Sitting balance - Comments: Pt is able to maintain static sitting with max cueing, frequently loses balance L and posterior but is able to correct  Static Standing Balance Static Standing - Balance Support: During functional activity Static Standing - Level of Assistance: 4: Min assist Dynamic Standing Balance Dynamic Standing - Balance Support: During functional activity Dynamic Standing - Level of Assistance: 3: Mod assist Extremity Assessment  RLE Assessment RLE Assessment: Within Functional Limits LLE Assessment LLE Assessment: Exceptions to [facility] General Strength Comments: minimal improvement since eval    Rosita DeChalus 03/01/2019, 12:13 PM   Burnard Bunting, PT, DPT 03/02/2019, 7:57 AM

## 2019-02-27 NOTE — Progress Notes (Signed)
Social Work Patient ID: [name], female   DOB: 06-Oct-1956, 63 y.o.   MRN: [id number]  I have received a SNF bed offer from [facility] who can admit pt on Monday.  Pt and boyfriend, Romeo Apple, aware and accept bed offer.  Plan to transfer via ambulance on Monday.  Alerted tx team.  Amada Jupiter, LCSW

## 2019-02-27 NOTE — Progress Notes (Signed)
[place] PHYSICAL MEDICINE & REHABILITATION PROGRESS NOTE   Subjective/Complaints: Patient seen sitting up in bed this morning.  She states she slept well overnight.  She denies complaints.  Discussed with nursing who mentioned increased lethargy after tizanidine yesterday.  ROS: Denies CP, shortness of breath, nausea, vomiting, diarrhea.  Objective:   No results found. No results for input(s): WBC, HGB, HCT, PLT in the last 72 hours. No results for input(s): NA, K, CL, CO2, GLUCOSE, BUN, CREATININE, CALCIUM in the last 72 hours.  Intake/Output Summary (Last 24 hours) at 02/27/2019 1000 Last data filed at 02/27/2019 0838 Gross per 24 hour  Intake 288 ml  Output -  Net 288 ml     Physical Exam: Vital Signs Blood pressure (!) 165/86, pulse 77, temperature 99.6 F (37.6 C), temperature source Oral, resp. rate 16, weight 62.8 kg, SpO2 96 %. Constitutional: NAD.  Vital signs reviewed. HENT: Normocephalic.  Atraumatic.  Poor dentition. Eyes: EOMI.  No discharge. Cardiovascular: No JVD. Respiratory: Normal effort. GI: Non--distended. Musc: No edema or tenderness in extremities. Neurologic: Alert Facial weakness, stable Dysarthria, stable Motor:  0/5 LUE, unchanged 2/5 hip flexion, distally 0/5 LLE, unchanged Increased tone LUE/LLE, unchanged Skin: Warm and dry.  Intact. Psych: Slowed  Assessment/Plan: 1. Functional deficits secondary to RIght Basal ganglia infarct which require 3+ hours per day of interdisciplinary therapy in a comprehensive inpatient rehab setting.  Physiatrist is providing close team supervision and 24 hour management of active medical problems listed below.  Physiatrist and rehab team continue to assess barriers to discharge/monitor patient progress toward functional and medical goals  Care Tool:  Bathing    Body parts bathed by patient: Chest, Front perineal area, Abdomen, Right upper leg, Left upper leg, Left arm, Buttocks, Right arm, Face   Body  parts bathed by helper: Right lower leg, Left lower leg     Bathing assist Assist Level: Minimal Assistance - Patient > 75%     Upper Body Dressing/Undressing Upper body dressing Upper body dressing/undressing activity did not occur (including orthotics): Refused What is the patient wearing?: Pull over shirt    Upper body assist Assist Level: Maximal Assistance - Patient 25 - 49%    Lower Body Dressing/Undressing Lower body dressing      What is the patient wearing?: Pants, Incontinence brief     Lower body assist Assist for lower body dressing: Total Assistance - Patient < 25%     Toileting Toileting    Toileting assist Assist for toileting: Maximal Assistance - Patient 25 - 49%     Transfers Chair/bed transfer  Transfers assist     Chair/bed transfer assist level: Minimal Assistance - Patient > 75%     Locomotion Ambulation   Ambulation assist   Ambulation activity did not occur: Safety/medical concerns  Assist level: Maximal Assistance - Patient 25 - 49% Assistive device: Other (comment)(rail in hallway) Max distance: 30'   Walk 10 feet activity   Assist  Walk 10 feet activity did not occur: Safety/medical concerns  Assist level: Maximal Assistance - Patient 25 - 49% Assistive device: Walker-hemi, Other (comment)(ace wrap R LE)   Walk 50 feet activity   Assist Walk 50 feet with 2 turns activity did not occur: Safety/medical concerns         Walk 150 feet activity   Assist Walk 150 feet activity did not occur: Safety/medical concerns         Walk 10 feet on uneven surface  activity   Assist Walk  10 feet on uneven surfaces activity did not occur: Safety/medical concerns         Wheelchair     Assist Will patient use wheelchair at discharge?: Yes Type of Wheelchair: Manual Wheelchair activity did not occur: Refused  Wheelchair assist level: Moderate Assistance - Patient 50 - 74% Max wheelchair distance: 62'     Wheelchair 50 feet with 2 turns activity    Assist    Wheelchair 50 feet with 2 turns activity did not occur: Refused   Assist Level: Moderate Assistance - Patient 50 - 74%   Wheelchair 150 feet activity     Assist Wheelchair 150 feet activity did not occur: Safety/medical concerns        Medical Problem List and Plan: 1.Left side weaknesssecondary to multiple right brain infarcts adjacent to previous right MCA infarcts on 02/04/2019 in the setting of 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 Follow-up outpatient interventional radiology for ICA aneurysm  Continue CIR, plan for SNF  WHO, PRAFO ordered for left side for spasticity/contracture mgt  Repeat head CT 4/1 reviewed, showing evolving infarct as well as chronic infarct, repeat head CT on 4/12 reviewed- stable infarct. 2. Antithrombotics: -DVT/anticoagulation:Subcutaneous Lovenox -antiplatelet therapy: Aspirin 325 mg daily 3. Pain Management:Lyrica 75 mg daily, Tylenol as needed  Topamax for headache started on 3/31, DC'd on 4/2 due to Lethargy-improving 4. Mood/bipolar disorder -antipsychotic agents: Risperdal 1 mg daily at bedtime 5. Neuropsych: This patientiscapable of making decisions on herown behalf. 6. Skin/Wound Care:Routine skin checks 7. Fluids/Electrolytes/Nutrition:Routine in and out's  8. Hypertension. Monitor with increased mobility. Patient on Norvasc 5 mg daily, Coreg 12.5 mg twice a day prior to admission. Resume as needed Vitals:   02/27/19 0500 02/27/19 0645  BP: (!) 120/98 (!) 165/86  Pulse: 76 77  Resp: 16   Temp: 99.6 F (37.6 C)   SpO2: 96% 96%   Norvasc 2.5 started on 3/31, increased to 5 on 4/2, increased to 7.5 on 4/13  Labile on 4/17, was relatively controlled 9. Diastolic congestive heart failure. Monitor for any signs of fluid overload Filed Weights   02/23/19 0429 02/25/19 0320 02/27/19 0500   Weight: 61.8 kg 63 kg 62.8 kg   Stable on 4/17 10. History oftobacco/cocaine use. Provide counseling 11. Hyperlipidemia. Lipitor 12.  Constipation: Improving 13.  Spasticity hemiparesis  Tizanidine 2 mg twice daily started on 4/3, increased to 4 mg on 4/5, increased to 6 mg on 4/7, increased to 8 mg twice daily on 4/10   Have been tolerating well, however reports of increased lethargy yesterday, will consider decreasing medications if persistent  Baclofen 5 3 times daily started on 3/31, DC'd on 4/2 due to Lethargy  Will require botulinum toxin injection for better spasticity control 14.  Prediabetes  Blood glucose slightly elevated on 4/13  Continue to monitor, will consider changing to carb modified diet if persistently elevated, will however will avoid at present to encourage nutritional intake 15.  Hypokalemia: Resolved  Potassium 3.9 on 4/13 16.  Hypoalbuminemia  Supplement initiated on 3/30 17.  Leukopenia:   WBCs 3.6 on 4/13   Labs ordered for Monday  Continue to monitor 18.  Thrombocytopenia: Resolved  Platelets 173 on 4/2  Continue to monitor 19.  Hyponatremia: Resolved  Sodium 138 on 4/13  Continue to monitor 20.  Fever:  Resolved  Blood cultures no growth  UA negative, urine culture no growth  Chest x-ray reviewed, unremarkable for acute process 21.  Seizures  Continue Keppra-started by neurology, tolerating medication  No seizures since medication initiation  LOS: 21 days A FACE TO FACE EVALUATION WAS PERFORMED  Melina Mosteller Karis Jubanil Deryck Hippler 02/27/2019, 10:00 AM

## 2019-02-28 ENCOUNTER — Inpatient Hospital Stay (HOSPITAL_COMMUNITY): Payer: Medicare Other

## 2019-02-28 ENCOUNTER — Inpatient Hospital Stay (HOSPITAL_COMMUNITY): Payer: Medicare Other | Admitting: Occupational Therapy

## 2019-02-28 NOTE — Progress Notes (Signed)
[place] PHYSICAL MEDICINE & REHABILITATION PROGRESS NOTE   Subjective/Complaints: No new issues this morning. Slept well. Denies pain  ROS: Patient denies fever, rash, sore throat, blurred vision, nausea, vomiting, diarrhea, cough, shortness of breath or chest pain, joint or back pain, headache, or mood change.    Objective:   No results found. No results for input(s): WBC, HGB, HCT, PLT in the last 72 hours. No results for input(s): NA, K, CL, CO2, GLUCOSE, BUN, CREATININE, CALCIUM in the last 72 hours.  Intake/Output Summary (Last 24 hours) at 02/28/2019 0923 Last data filed at 02/27/2019 2036 Gross per 24 hour  Intake 480 ml  Output -  Net 480 ml     Physical Exam: Vital Signs Blood pressure (!) 145/89, pulse 81, temperature 98.3 F (36.8 C), temperature source Oral, resp. rate 19, weight 62.6 kg, SpO2 95 %. Constitutional: No distress . Vital signs reviewed. HEENT: EOMI, oral membranes moist Neck: supple Cardiovascular: RRR without murmur. No JVD    Respiratory: CTA Bilaterally without wheezes or rales. Normal effort    GI: BS +, non-tender, non-distended  Musc: No edema or tenderness in extremities. Neurologic: Alert Facial weakness, stable Dysarthria, stable Motor:  0/5 LUE, unchanged 2/5 hip flexion, distally 0/5 LLE, no changes Increased tone LUE/LLE, no changes Skin: Warm and dry.  Intact. Psych: flat  Assessment/Plan: 1. Functional deficits secondary to RIght Basal ganglia infarct which require 3+ hours per day of interdisciplinary therapy in a comprehensive inpatient rehab setting.  Physiatrist is providing close team supervision and 24 hour management of active medical problems listed below.  Physiatrist and rehab team continue to assess barriers to discharge/monitor patient progress toward functional and medical goals  Care Tool:  Bathing    Body parts bathed by patient: Chest, Front perineal area, Abdomen, Right upper leg, Left upper leg, Left  arm, Face   Body parts bathed by helper: Right lower leg, Left lower leg, Buttocks, Right arm     Bathing assist Assist Level: Moderate Assistance - Patient 50 - 74%     Upper Body Dressing/Undressing Upper body dressing Upper body dressing/undressing activity did not occur (including orthotics): Refused What is the patient wearing?: Pull over shirt    Upper body assist Assist Level: Moderate Assistance - Patient 50 - 74%    Lower Body Dressing/Undressing Lower body dressing      What is the patient wearing?: Pants, Incontinence brief     Lower body assist Assist for lower body dressing: Maximal Assistance - Patient 25 - 49%     Toileting Toileting    Toileting assist Assist for toileting: Maximal Assistance - Patient 25 - 49%     Transfers Chair/bed transfer  Transfers assist     Chair/bed transfer assist level: Moderate Assistance - Patient 50 - 74%     Locomotion Ambulation   Ambulation assist   Ambulation activity did not occur: Safety/medical concerns  Assist level: Maximal Assistance - Patient 25 - 49% Assistive device: Other (comment)(rail in hallway) Max distance: 30'   Walk 10 feet activity   Assist  Walk 10 feet activity did not occur: Safety/medical concerns  Assist level: Maximal Assistance - Patient 25 - 49% Assistive device: Walker-hemi, Other (comment)(ace wrap R LE)   Walk 50 feet activity   Assist Walk 50 feet with 2 turns activity did not occur: Safety/medical concerns         Walk 150 feet activity   Assist Walk 150 feet activity did not occur: Safety/medical concerns  Walk 10 feet on uneven surface  activity   Assist Walk 10 feet on uneven surfaces activity did not occur: Safety/medical concerns         Wheelchair     Assist Will patient use wheelchair at discharge?: Yes Type of Wheelchair: Manual Wheelchair activity did not occur: Refused  Wheelchair assist level: Moderate Assistance - Patient  50 - 74% Max wheelchair distance: 40'    Wheelchair 50 feet with 2 turns activity    Assist    Wheelchair 50 feet with 2 turns activity did not occur: Refused   Assist Level: Moderate Assistance - Patient 50 - 74%   Wheelchair 150 feet activity     Assist Wheelchair 150 feet activity did not occur: Safety/medical concerns        Medical Problem List and Plan: 1.Left side weaknesssecondary to multiple right brain infarcts adjacent to previous right MCA infarcts on 02/04/2019 in the setting of 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 Follow-up outpatient interventional radiology for ICA aneurysm  Continue CIR, plan for SNF  WHO, PRAFO ordered for left side for spasticity/contracture mgt  Repeat head CT 4/1 reviewed, showing evolving infarct as well as chronic infarct, repeat head CT on 4/12 reviewed- stable infarct. 2. Antithrombotics: -DVT/anticoagulation:Subcutaneous Lovenox -antiplatelet therapy: Aspirin 325 mg daily 3. Pain Management:Lyrica 75 mg daily, Tylenol as needed  Topamax for headache started on 3/31, DC'd on 4/2 due to Lethargy-improved 4. Mood/bipolar disorder -antipsychotic agents: Risperdal 1 mg daily at bedtime 5. Neuropsych: This patientiscapable of making decisions on herown behalf. 6. Skin/Wound Care:Routine skin checks 7. Fluids/Electrolytes/Nutrition:Routine in and out's  8. Hypertension. Monitor with increased mobility. Patient on Norvasc 5 mg daily, Coreg 12.5 mg twice a day prior to admission. Resume as needed Vitals:   02/27/19 2024 02/28/19 0306  BP: (!) 146/83 (!) 145/89  Pulse: 83 81  Resp: 18 19  Temp: 99.8 F (37.7 C) 98.3 F (36.8 C)  SpO2: 97% 95%   Norvasc 2.5 started on 3/31, increased to 5 on 4/2, increased to 7.5 on 4/13  Borderline, consider increase of norvasc to  9. Diastolic congestive heart failure. Monitor for any signs of fluid  overload Filed Weights   02/25/19 0320 02/27/19 0500 02/28/19 0311  Weight: 63 kg 62.8 kg 62.6 kg   Stable on 4/18 10. History oftobacco/cocaine use. Provide counseling 11. Hyperlipidemia. Lipitor 12.  Constipation: Improving 13.  Spasticity hemiparesis  Tizanidine 2 mg twice daily started on 4/3, increased to 4 mg on 4/5, increased to 6 mg on 4/7, increased to 8 mg twice daily on 4/10   Have been tolerating well, however reports of increased lethargy    -seems reasonable alert this morning---observe  Baclofen 5 3 times daily started on 3/31, DC'd on 4/2 due to Lethargy  Will require botulinum toxin injection for better spasticity control as outpt 14.  Prediabetes  -blood glucose with labs 15.  Hypokalemia: Resolved  Potassium 3.9 on 4/13 16.  Hypoalbuminemia  Supplement initiated on 3/30 17.  Leukopenia:   WBCs 3.6 on 4/13   Labs ordered for Monday  Continue to monitor 18.  Thrombocytopenia: Resolved  Platelets 173 on 4/2  Continue to monitor 19.  Hyponatremia: Resolved  Sodium 138 on 4/13  Continue to monitor 20.  Fever:  Resolved  Blood cultures no growth  UA negative, urine culture no growth  Chest x-ray reviewed, unremarkable for acute process 21.  Seizures  Continue Keppra-started by neurology, tolerating medication  No seizures since medication initiation  LOS: 22 days A FACE TO FACE EVALUATION WAS PERFORMED  Ranelle Oyster 02/28/2019, 9:23 AM

## 2019-02-28 NOTE — Progress Notes (Signed)
Occupational Therapy Session Note  Patient Details  Name: [name] MRN: [id number] Date of Birth: Jun 27, 1956  Today's Date: 02/28/2019 OT Individual Time: 0800-0900 OT Individual Time Calculation (min): 60 min    Short Term Goals: Week 1:  OT Short Term Goal 1 (Week 1): Pt will perform toilet transfer with max A in order to decrease level of assist with functional transfers. OT Short Term Goal 1 - Progress (Week 1): Not met OT Short Term Goal 2 (Week 1): Pt will maintain dynamic balance during functional tasks seated EOB with min A overall. OT Short Term Goal 2 - Progress (Week 1): Met OT Short Term Goal 3 (Week 1): Pt will perform UB dressing with mod A in order to decrease level of assist with self care. OT Short Term Goal 3 - Progress (Week 1): Not met Week 2:  OT Short Term Goal 1 (Week 2): Pt will perform UB dressing with mod A in order to decrease level of assist with self care. OT Short Term Goal 1 - Progress (Week 2): Progressing toward goal OT Short Term Goal 2 (Week 2): Pt will perform toilet transfer with max A in order to decrease level of assist with functional transfers. OT Short Term Goal 2 - Progress (Week 2): Met OT Short Term Goal 3 (Week 2): Pt will complete sit<>stand with max A of 1 person without [place] in preparation for BADL tasks OT Short Term Goal 3 - Progress (Week 2): Met Week 3:  OT Short Term Goal 1 (Week 3): STG=LTG 2/2 ELOS  Skilled Therapeutic Interventions/Progress Updates:    Pt received in bed and agreeable to therapy.  Pt moved quite well today never needing more than mod A with her mobility. She sat to EOB, completed stand pivot to W/c then to toilet and back to w/c with mod A.  Sit to stand at sink with min A.  Stood briefly with min A using RUE for support.   Pt had a dry brief and tried to void on toilet but no void.  She completed B/ D at sink with mod A UB and max LB.    Increased tone in LUE today.    Pt resting in w/c with belt alarm  on and all needs met.    Therapy Documentation Precautions:  Precautions Precautions: Fall Restrictions Weight Bearing Restrictions: No  Pain: Pain Assessment Pain Score: 0-No pain    Therapy/Group: Individual Therapy  [place] 02/28/2019, 12:11 PM

## 2019-02-28 NOTE — Progress Notes (Signed)
Physical Therapy Session Note  Patient Details  Name: [name] MRN: [id number] Date of Birth: Jul 06, 1956  Today's Date: 02/28/2019 PT Individual Time: 1335-1420 PT Individual Time Calculation (min): 45 min   Short Term Goals:  Week 3:  PT Short Term Goal 1 (Week 3): =LTGs due to ELOS    Skilled Therapeutic Interventions/Progress Updates:  Pt awake, sitting in recliner.  Sit> stand to [place] with min assist, to w/c.  Gait training using R rail, mod assist for LLE placement, wt shifting, max cues for sequencing, longer R step length.  Pt initiated stepping with LLE 90% of the time.  neuromuscular re-education via forced use, multimodal cues for alternating reciprocal movement x 25 cycles, and L unilateral movement x 25 cycles.  Pt had difficulty initiating movement with LLE, and required total assist for LLE mass extension when attempting unilaterally.  Alertness declined during session. As PT was placing seat belt alarm on pt, she received a phone call and talked to a friend.  She fell asleep holding the phone by the end of the session.  Pt left resting in w/c with seat belt alarm set and needs at hand.     Therapy Documentation Precautions:  Precautions Precautions: Fall Restrictions Weight Bearing Restrictions: No General: PT Amount of Missed Time (min): 30 Minutes PT Missed Treatment Reason: Patient fatigue(somnloent)  Pain: pt denies        Therapy/Group: Individual Therapy  Ovid Witman 02/28/2019, 2:25 PM

## 2019-03-01 ENCOUNTER — Inpatient Hospital Stay (HOSPITAL_COMMUNITY): Payer: Medicare Other

## 2019-03-01 ENCOUNTER — Inpatient Hospital Stay (HOSPITAL_COMMUNITY): Payer: Medicare Other | Admitting: Physical Therapy

## 2019-03-01 NOTE — Progress Notes (Signed)
Occupational Therapy Discharge Summary  Patient Details  Name: [name] MRN: [id number] Date of Birth: 1956/09/03  Today's Date: 03/01/2019 OT Individual Time: 1050-1200 OT Individual Time Calculation (min): 70 min    Patient has met 6 of 8 long term goals due to improved activity tolerance, improved balance, postural control, ability to compensate for deficits, improved attention and improved awareness.  Patient to discharge at overall Mod Assist level.  Patient's care partner is unavailable to provide the necessary physical and cognitive assistance at discharge and therefore pt will d/c to SNF to continue rehab. Pt's significant other has attended OT's family education session but left before PT and SLP could complete their respective sessions.   Reasons goals not met: Pt did not meet her sitting balance nor her lower body dressing goal. Pt continues to demonstrate significant L hemiplegia and tone that limits her sitting balance to requiring min A, and max A for LB dressing.   Recommendation:  Patient will benefit from ongoing skilled OT services in skilled nursing facility setting to continue to advance functional skills in the area of BADL.  Equipment: Defer to next venue  Reasons for discharge: discharge from hospital  Patient/family agrees with progress made and goals achieved: Yes   Skilled OT Intervention:  Pt received supine with no c/o pain. Pt transitioned to EOB with mod A for elevating trunk and manual facilitation of LLE. Pt's LUE in full flexion with significant tone limiting PROM. Pt completed ADLs as described below in ADL section. Pt required constant cueing for midline orientation and to maintain EOB sitting balance, however was able to correct with no more than min A when cued. Pt required moderate cueing for attention to L side during bathing/dressing. Pt completed several sit <> stand transfers with use of hemi walker with min A from EOB. Min- mod A to maintain  static standing balance during peri hygiene and clothing management. Pt completed squat pivot transfer to w/c with mod A d/t confusion with hand placement. Later in session pt able to complete transfer with min A back to bed. Pt completed oral care at sink with set up assist. Pt completed dynavision with moderate cueing required for L attention. Pt's reaction time was as follows: L: 15.89. R: 3.88 seconds. Pt returned to room and was left supine in bed with all needs met. Pillows were used to facilitate proper alignment of L UE and a resting hand splint donned. Bed alarm set.   OT Discharge Precautions/Restrictions  Precautions Precautions: Fall Precaution Comments: LUE flexor tone, LLE extensor tone Restrictions Weight Bearing Restrictions: No Pain Pain Assessment Pain Scale: 0-10 Pain Score: 0-No pain ADL ADL Eating: Set up Where Assessed-Eating: Bed level Grooming: Supervision/safety, Setup Where Assessed-Grooming: Sitting at sink Upper Body Bathing: Moderate assistance Where Assessed-Upper Body Bathing: Edge of bed Lower Body Bathing: Moderate assistance Where Assessed-Lower Body Bathing: Standing at sink Upper Body Dressing: Moderate assistance Where Assessed-Upper Body Dressing: Edge of bed Lower Body Dressing: Maximal assistance Where Assessed-Lower Body Dressing: Edge of bed Toileting: Maximal assistance Where Assessed-Toileting: Bedside Commode Toilet Transfer: Moderate assistance Toilet Transfer Method: Stand pivot Toilet Transfer Equipment: Bedside commode Vision Baseline Vision/History: No visual deficits Patient Visual Report: Blurring of vision Vision Assessment?: Yes Eye Alignment: Within Functional Limits Alignment/Gaze Preference: Gaze right Tracking/Visual Pursuits: Decreased smoothness of horizontal tracking;Decreased smoothness of eye movement to LEFT inferior field;Decreased smoothness of eye movement to LEFT superior field Saccades: Decreased speed of  saccadic movement Visual Fields: Left visual field deficit  Perception  Perception: Impaired Inattention/Neglect: Does not attend to left side of body;Does not attend to left visual field Praxis Praxis: Impaired Praxis Impairment Details: Ideomotor Cognition Overall Cognitive Status: History of cognitive impairments - at baseline Arousal/Alertness: Awake/alert Orientation Level: Oriented to person;Oriented to place;Disoriented to time;Oriented to situation Attention: Sustained Focused Attention: Appears intact Sustained Attention: Impaired Sustained Attention Impairment: Functional complex Memory: Impaired Memory Impairment: Retrieval deficit;Decreased recall of new information Awareness: Impaired Awareness Impairment: Emergent impairment Problem Solving: Impaired Problem Solving Impairment: Functional basic Executive Function: Self Monitoring Self Monitoring: Impaired Self Monitoring Impairment: Functional complex;Verbal complex Safety/Judgment: Impaired Sensation Sensation Light Touch: Impaired Detail Central sensation comments: Unable to accurately identify any light touch on L UE or L LE Light Touch Impaired Details: Impaired LLE;Impaired LUE Proprioception: Impaired by gross assessment Coordination Gross Motor Movements are Fluid and Coordinated: No Fine Motor Movements are Fluid and Coordinated: No Coordination and Movement Description: Dense L hemiplegia UE and LE  Motor  Motor Motor: Hemiplegia;Abnormal tone Motor - Discharge Observations: L hemi with tone in L UE Mobility  Bed Mobility Bed Mobility: Supine to Sit;Sit to Supine Supine to Sit: Moderate Assistance - Patient 50-74% Sit to Supine: Moderate Assistance - Patient 50-74% Transfers Sit to Stand: Minimal Assistance - Patient > 75%  Trunk/Postural Assessment  Cervical Assessment Cervical Assessment: Exceptions to WFL(forward head) Thoracic Assessment Thoracic Assessment: Exceptions to WFL(rounded  shoulders) Lumbar Assessment Lumbar Assessment: Exceptions to WFL(posterior pelvic tilt) Postural Control Postural Control: Deficits on evaluation  Balance Balance Balance Assessed: Yes Static Sitting Balance Static Sitting - Balance Support: Feet supported Static Sitting - Level of Assistance: 4: Min assist Static Sitting - Comment/# of Minutes: Requires constant cueing for midline orientation Dynamic Sitting Balance Dynamic Sitting - Balance Support: During functional activity Dynamic Sitting - Level of Assistance: 4: Min assist Sitting balance - Comments: Pt is able to maintain static sitting with max cueing, frequently loses balance L and posterior but is able to correct  Static Standing Balance Static Standing - Balance Support: During functional activity Static Standing - Level of Assistance: 4: Min assist Dynamic Standing Balance Dynamic Standing - Balance Support: During functional activity Dynamic Standing - Level of Assistance: 3: Mod assist Extremity/Trunk Assessment RUE Assessment RUE Assessment: Within Functional Limits LUE Assessment LUE Assessment: Exceptions to [facility] Passive Range of Motion (PROM) Comments: decreased secondary to tone and pain with stretching General Strength Comments: premorbid increased tone.  LUE Tone LUE Tone: Hypertonic   Curtis Sites 03/01/2019, 12:09 PM

## 2019-03-01 NOTE — Progress Notes (Signed)
[place] PHYSICAL MEDICINE & REHABILITATION PROGRESS NOTE   Subjective/Complaints: No new complaints. Getting set up for breakfast  ROS: Limited due to cognitive/behavioral    Objective:   No results found. No results for input(s): WBC, HGB, HCT, PLT in the last 72 hours. No results for input(s): NA, K, CL, CO2, GLUCOSE, BUN, CREATININE, CALCIUM in the last 72 hours.  Intake/Output Summary (Last 24 hours) at 03/01/2019 0939 Last data filed at 03/01/2019 0800 Gross per 24 hour  Intake 660 ml  Output -  Net 660 ml     Physical Exam: Vital Signs Blood pressure (!) 150/85, pulse 72, temperature 98 F (36.7 C), temperature source Oral, resp. rate 19, weight 61.1 kg, SpO2 98 %. Constitutional: No distress . Vital signs reviewed. HEENT: EOMI, oral membranes moist Neck: supple Cardiovascular: RRR without murmur. No JVD    Respiratory: CTA Bilaterally without wheezes or rales. Normal effort    GI: BS +, non-tender, non-distended  Musc: No edema or tenderness in extremities. Neurologic: Alert Facial weakness, stable Dysarthria, stable Motor:  0/5 LUE, unchanged 2/5 hip flexion, distally 0/5 LLE, stable Increased tone LUE/LLE, no changes Skin: Warm and dry.  Intact. Psych: flat  Assessment/Plan: 1. Functional deficits secondary to RIght Basal ganglia infarct which require 3+ hours per day of interdisciplinary therapy in a comprehensive inpatient rehab setting.  Physiatrist is providing close team supervision and 24 hour management of active medical problems listed below.  Physiatrist and rehab team continue to assess barriers to discharge/monitor patient progress toward functional and medical goals  Care Tool:  Bathing    Body parts bathed by patient: Chest, Front perineal area, Abdomen, Right upper leg, Left upper leg, Left arm, Face   Body parts bathed by helper: Right lower leg, Left lower leg, Buttocks, Right arm     Bathing assist Assist Level: Moderate  Assistance - Patient 50 - 74%     Upper Body Dressing/Undressing Upper body dressing Upper body dressing/undressing activity did not occur (including orthotics): Refused What is the patient wearing?: Pull over shirt    Upper body assist Assist Level: Moderate Assistance - Patient 50 - 74%    Lower Body Dressing/Undressing Lower body dressing      What is the patient wearing?: Pants, Incontinence brief     Lower body assist Assist for lower body dressing: Maximal Assistance - Patient 25 - 49%     Toileting Toileting    Toileting assist Assist for toileting: Maximal Assistance - Patient 25 - 49%     Transfers Chair/bed transfer  Transfers assist     Chair/bed transfer assist level: Moderate Assistance - Patient 50 - 74%     Locomotion Ambulation   Ambulation assist   Ambulation activity did not occur: Safety/medical concerns  Assist level: Moderate Assistance - Patient 50 - 74% Assistive device: Other (comment)(R railing in hallway) Max distance: 15   Walk 10 feet activity   Assist  Walk 10 feet activity did not occur: Safety/medical concerns  Assist level: Moderate Assistance - Patient - 50 - 74% Assistive device: Other (comment)(R railing in hallway)   Walk 50 feet activity   Assist Walk 50 feet with 2 turns activity did not occur: Safety/medical concerns         Walk 150 feet activity   Assist Walk 150 feet activity did not occur: Safety/medical concerns         Walk 10 feet on uneven surface  activity   Assist Walk 10 feet on uneven  surfaces activity did not occur: Safety/medical concerns         Wheelchair     Assist Will patient use wheelchair at discharge?: (P) Yes Type of Wheelchair: (P) Manual Wheelchair activity did not occur: Refused  Wheelchair assist level: (P) Moderate Assistance - Patient 50 - 74% Max wheelchair distance: (P) 57ft    Wheelchair 50 feet with 2 turns activity    Assist    Wheelchair 50  feet with 2 turns activity did not occur: Refused   Assist Level: (P) Moderate Assistance - Patient 50 - 74%   Wheelchair 150 feet activity     Assist Wheelchair 150 feet activity did not occur: (P) Safety/medical concerns        Medical Problem List and Plan: 1.Left side weaknesssecondary to multiple right brain infarcts adjacent to previous right MCA infarcts on 02/04/2019 in the setting of 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 Follow-up outpatient interventional radiology for ICA aneurysm  Continue CIR, plan for SNF  WHO, PRAFO ordered for left side for spasticity/contracture mgt  Repeat head CT 4/1 reviewed, showing evolving infarct as well as chronic infarct, repeat head CT on 4/12 reviewed- stable infarct. 2. Antithrombotics: -DVT/anticoagulation:Subcutaneous Lovenox -antiplatelet therapy: Aspirin 325 mg daily 3. Pain Management:Lyrica 75 mg daily, Tylenol as needed  Topamax for headache started on 3/31, DC'd on 4/2 due to Lethargy-improved 4. Mood/bipolar disorder -antipsychotic agents: Risperdal 1 mg daily at bedtime 5. Neuropsych: This patientiscapable of making decisions on herown behalf. 6. Skin/Wound Care:Routine skin checks 7. Fluids/Electrolytes/Nutrition:Routine in and out's  8. Hypertension. Monitor with increased mobility. Patient on Norvasc 5 mg daily, Coreg 12.5 mg twice a day prior to admission. Resume as needed Vitals:   02/28/19 1921 03/01/19 0306  BP: 132/83 (!) 150/85  Pulse: 78 72  Resp: 18 19  Temp: 98.2 F (36.8 C) 98 F (36.7 C)  SpO2: 96% 98%   Norvasc 2.5 started on 3/31, increased to 5 on 4/2, increased to 7.5 on 4/13  Increased norvasc to 10mg  on 4/18 9. Diastolic congestive heart failure. Monitor for any signs of fluid overload Filed Weights   02/27/19 0500 02/28/19 0311 03/01/19 0306  Weight: 62.8 kg 62.6 kg 61.1 kg   Stable on 4/19 10. History  oftobacco/cocaine use. Provide counseling 11. Hyperlipidemia. Lipitor 12.  Constipation: Improving 13.  Spasticity hemiparesis  Tizanidine 2 mg twice daily started on 4/3, increased to 4 mg on 4/5, increased to 6 mg on 4/7, increased to 8 mg twice daily on 4/10   Have been tolerating well, however reports of increased lethargy    -seems reasonable alert this morning---observe  Baclofen 5 3 times daily started on 3/31, DC'd on 4/2 due to Lethargy  Will require botulinum toxin injection for better spasticity control as outpt 14.  Prediabetes  -blood glucose with labs 15.  Hypokalemia: Resolved  Potassium 3.9 on 4/13 16.  Hypoalbuminemia  Supplement initiated on 3/30 17.  Leukopenia:   WBCs 3.6 on 4/13   Labs ordered for Monday  Continue to monitor 18.  Thrombocytopenia: Resolved  Platelets 173 on 4/2  Continue to monitor 19.  Hyponatremia: Resolved  Sodium 138 on 4/13  Continue to monitor 20.  Fever:  Resolved  Blood cultures no growth  UA negative, urine culture no growth  Chest x-ray  unremarkable for acute process 21.  Seizures  Continue Keppra-started by neurology, tolerating medication  No seizures since medication initiation  LOS: 23 days A FACE TO  FACE EVALUATION WAS PERFORMED  Ranelle OysterZachary T Adonna Horsley 03/01/2019, 9:39 AM

## 2019-03-01 NOTE — Discharge Summary (Addendum)
Physician Discharge Summary  Patient ID: [name] MRN: [id number] DOB/AGE: [birth year] 63 y.o.  Admit date: 02/06/2019 Discharge date: 03/02/2019  Discharge Diagnoses:  Active Problems:   Acute right MCA stroke (HCC)   Thrombocytopenia (HCC)   Leukopenia   Hypoalbuminemia due to protein-calorie malnutrition (HCC)   Hypokalemia   Prediabetes   Spastic hemiplegia affecting nondominant side (HCC)   Chronic diastolic congestive heart failure (HCC)   Substance abuse (HCC)   Essential hypertension   Labile blood pressure   Hypertensive crisis   Vascular headache   Spastic hemiparesis (HCC)   Hyponatremia   FUO (fever of unknown origin)   Seizures (HCC)   Discharged Condition: Stable  Significant Diagnostic Studies: Ct Angio Head W Or Wo Contrast  Result Date: 02/03/2019 CLINICAL DATA:  Focal neurological deficit. Falling. Acute infarction on the right. EXAM: CT ANGIOGRAPHY HEAD AND NECK TECHNIQUE: Multidetector CT imaging of the head and neck was performed using the standard protocol during bolus administration of intravenous contrast. Multiplanar CT image reconstructions and MIPs were obtained to evaluate the vascular anatomy. Carotid stenosis measurements (when applicable) are obtained utilizing NASCET criteria, using the distal internal carotid diameter as the denominator. CONTRAST:  100mL OMNIPAQUE IOHEXOL 350 MG/ML SOLN COMPARISON:  None. FINDINGS: CT HEAD FINDINGS Brain: No visible change since earlier today. Extensive chronic ischemic change throughout the right hemisphere with atrophy, encephalomalacia, gliosis and porencephaly. The acute infarction demonstrated in the right thalamus/basal ganglia and parieto-occipital brain can not be specifically appreciated on the CT. Widespread changes of chronic small vessel disease again evident throughout the hemispheric white matter. Chronic small-vessel ischemic changes throughout the pons. Vascular: Negative Skull: Negative Sinuses:  Clear Orbits: Negative Review of the MIP images confirms the above findings CTA NECK FINDINGS Aortic arch: Aortic atherosclerosis. No aneurysm or dissection. Calcification at the brachiocephalic vessel origins but no flow limiting stenosis. Right carotid system: Common carotid artery widely patent to the bifurcation. Acute appearing occlusion of the right internal carotid artery with what appears to be luminal clock with its crescent of surrounding contrast. I do not definitely establish antegrade flow extending to the skull base and brain. Left carotid system: Common carotid artery is widely patent to the bifurcation. There is calcification in the ICA bulb, but without stenosis compared to the more distal cervical ICA. Vertebral arteries: Both vertebral artery origins are widely patent. Skeleton: Ordinary cervical spondylosis. Other neck: No mass or lymphadenopathy. Upper chest: Scarring in the upper lobes. Review of the MIP images confirms the above findings CTA HEAD FINDINGS Anterior circulation: Left internal carotid artery is patent through the skull base and siphon region. There is some atherosclerotic calcification in the carotid siphon but no stenosis greater than 30%. No antegrade flow in the right carotid siphon. Left internal carotid artery supplies the left middle cerebral artery territory, both anterior cerebral artery territories in the right middle cerebral artery territory through patent communicating arteries. 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 Posterior circulation: Both vertebral arteries are patent to the basilar. No basilar stenosis. Posterior circulation branch vessels are patent. Patent posterior communicating artery on the left. Venous sinuses: Patent and normal. Anatomic variants: None other significant. Delayed phase: No abnormal enhancement. Review of the MIP images confirms the above findings IMPRESSION: I  believe that the right internal carotid artery is likely to be acute based on the appearance at CTA. There is a central filling defect within the ICA  bulb with some surrounding contrast, and complete occlusion of the mid to distal cervical ICA. This looks like an acute thrombotic or embolic occlusion. There is no antegrade flow in the right carotid siphon. Atherosclerotic calcification at the left ICA bulb but without stenosis. Left ICA patent through the skull base and siphon region, supplying the anterior circulation on both sides. 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 This has enlarged since 2012. Specialist evaluation of this would be warranted at some point. Electronically Signed   By: Paulina Fusi M.D.   On: 02/03/2019 18:36   Dg Chest 2 View  Result Date: 02/16/2019 CLINICAL DATA:  Fever. EXAM: CHEST - 2 VIEW COMPARISON:  09/16/2008 and 09/05/2008 FINDINGS: Lungs are adequately inflated without focal airspace consolidation or effusion. Mild stable cardiomegaly. Remainder of the exam is unchanged. IMPRESSION: No active cardiopulmonary disease. Electronically Signed   By: Elberta Fortis M.D.   On: 02/16/2019 11:55   Ct Head Wo Contrast  Result Date: 02/22/2019 CLINICAL DATA:  63 y/o  F; seizure today. EXAM: CT HEAD WITHOUT CONTRAST TECHNIQUE: Contiguous axial images were obtained from the base of the skull through the vertex without intravenous contrast. COMPARISON:  02/11/2019 CT head.  02/03/2019 MRI head. 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 No new large acute stroke, hemorrhage, extra-axial collection, or mass effect is evident. Background of nonspecific white matter hypodensities are compatible with chronic microvascular ischemic changes and there is diffuse  volume loss of the brain stable in comparison with prior MRI given differences in technique. Vascular: No hyperdense vessel or unexpected calcification. Skull: Normal. Negative for fracture or focal lesion. Sinuses/Orbits: Mild mucosal thickening within the left maxillary sinus. Additional visible paranasal sinuses and the mastoid air cells are unremarkable. Orbits are unremarkable. Other: None. IMPRESSION: 1. No acute intracranial abnormality identified. 2. Stable distribution of large chronic infarction in the right cerebral hemisphere. Stable advanced chronic microvascular ischemic changes and volume loss of the brain. Electronically Signed   By: Mitzi Hansen M.D.   On: 02/22/2019 23:18   Ct Head Wo Contrast  Result Date: 02/11/2019 CLINICAL DATA:  63 year old female EXAM: CT HEAD WITHOUT CONTRAST TECHNIQUE: Contiguous axial images were obtained from the base of the skull through the vertex without intravenous contrast. COMPARISON:  CT imaging 02/03/2019, MR imaging 02/03/2019, with additional prior CT 10/26/2011 FINDINGS: Brain: Redemonstration of encephalomalacia of the majority of the right hemisphere with ex vacuo dilation of the right lateral ventricle and cortical thinning. Evolution of acute/subacute infarction of the paramedian right posterior frontal and parietal lobe as well as within the right corona radiata extending towards posterior limb. No acute intracranial hemorrhage.  No midline shift. Confluent hypodensity in the periventricular white matter. Vascular: No significant intracranial atherosclerosis. Skull: No acute displaced fracture. Sinuses/Orbits: Unremarkable Other: None IMPRESSION: No acute intracranial abnormality. Evolution of the evolving infarction of right hemisphere, as above. Similar appearance of chronic changes of remote right MCA infarction and expansion of the right lateral ventricle, with background changes of chronic microvascular ischemic disease. Electronically  Signed   By: Gilmer Mor D.O.   On: 02/11/2019 14:37   Ct Head Wo Contrast  Result Date: 02/03/2019 CLINICAL DATA:  Acute headache.  Two falls at home. EXAM: CT HEAD WITHOUT CONTRAST TECHNIQUE: Contiguous axial images were obtained from the base of  the skull through the vertex without intravenous contrast. COMPARISON:  MR brain dated February 14, 2012. CT head dated October 26, 2011. FINDINGS: Brain: No evidence of acute infarction, hemorrhage, hydrocephalus, extra-axial collection or mass lesion/mass effect. Extensive right cerebral hemispheric encephalomalacia related to old large MCA and PCA territory infarct, with prominent ex vacuo dilatation of the right lateral ventricle. Progressive moderate to severe chronic microvascular ischemic changes. Vascular: No hyperdense vessel or unexpected calcification. Skull: Normal. Negative for fracture or focal lesion. Sinuses/Orbits: No acute finding. Other: None. IMPRESSION: 1.  No acute intracranial abnormality. 2. Extensive right cerebral hemispheric encephalomalacia related to old large MCA and PCA territory infarcts. Electronically Signed   By: Obie Dredge M.D.   On: 02/03/2019 13:06   Ct Angio Neck W Or Wo Contrast  Result Date: 02/03/2019 CLINICAL DATA:  Focal neurological deficit. Falling. Acute infarction on the right. EXAM: CT ANGIOGRAPHY HEAD AND NECK TECHNIQUE: Multidetector CT imaging of the head and neck was performed using the standard protocol during bolus administration of intravenous contrast. Multiplanar CT image reconstructions and MIPs were obtained to evaluate the vascular anatomy. Carotid stenosis measurements (when applicable) are obtained utilizing NASCET criteria, using the distal internal carotid diameter as the denominator. CONTRAST:  OMNIPAQUE IOHEXOL 350 MG/ML SOLN COMPARISON:  None. FINDINGS: CT HEAD FINDINGS Brain: No visible change since earlier today. Extensive chronic ischemic change throughout the right hemisphere with  atrophy, encephalomalacia, gliosis and porencephaly. The acute infarction demonstrated in the right thalamus/basal ganglia and parieto-occipital brain can not be specifically appreciated on the CT. Widespread changes of chronic small vessel disease again evident throughout the hemispheric white matter. Chronic small-vessel ischemic changes throughout the pons. Vascular: Negative Skull: Negative Sinuses: Clear Orbits: Negative Review of the MIP images confirms the above findings CTA NECK FINDINGS Aortic arch: Aortic atherosclerosis. No aneurysm or dissection. Calcification at the brachiocephalic vessel origins but no flow limiting stenosis. Right carotid system: Common carotid artery widely patent to the bifurcation. Acute appearing occlusion of the right internal carotid artery with what appears to be luminal clock with its crescent of surrounding contrast. I do not definitely establish antegrade flow extending to the skull base and brain. Left carotid system: Common carotid artery is widely patent to the bifurcation. There is calcification in the ICA bulb, but without stenosis compared to the more distal cervical ICA. Vertebral arteries: Both vertebral artery origins are widely patent. Skeleton: Ordinary cervical spondylosis. Other neck: No mass or lymphadenopathy. Upper chest: Scarring in the upper lobes. Review of the MIP images confirms the above findings CTA HEAD FINDINGS Anterior circulation: Left internal carotid artery is patent through the skull base and siphon region. There is some atherosclerotic calcification in the carotid siphon but no stenosis greater than 30%. No antegrade flow in the right carotid siphon. Left internal carotid artery supplies the left middle cerebral artery territory, both anterior cerebral artery territories in the right middle cerebral artery territory through patent communicating arteries. 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 Posterior circulation: Both vertebral arteries are patent to the basilar. No basilar stenosis. Posterior circulation branch vessels are patent. Patent posterior communicating artery on the left. Venous sinuses: Patent and normal. Anatomic variants: None other significant. Delayed phase: No abnormal enhancement. Review of the MIP images confirms the above findings IMPRESSION: I believe that the right internal carotid artery is likely to be acute based on the appearance at CTA. There is a  central filling defect within the ICA bulb with some surrounding contrast, and complete occlusion of the mid to distal cervical ICA. This looks like an acute thrombotic or embolic occlusion. There is no antegrade flow in the right carotid siphon. Atherosclerotic calcification at the left ICA bulb but without stenosis. Left ICA patent through the skull base and siphon region, supplying the anterior circulation on both sides. 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 This has enlarged since 2012. Specialist evaluation of this would be warranted at some point. Electronically Signed   By: Paulina Fusi M.D.   On: 02/03/2019 18:36   Mr Brain Wo Contrast  Result Date: 02/03/2019 CLINICAL DATA:  Increased falls.  Previous CVA. EXAM: MRI HEAD WITHOUT CONTRAST TECHNIQUE: Multiplanar, multiecho pulse sequences of the brain and surrounding structures were obtained without intravenous contrast. COMPARISON:  CT head earlier today.  Prior MR 02/14/2012. FINDINGS: Brain: Large area of encephalomalacia RIGHT hemisphere, marked compensatory enlargement of the ventricle, RIGHT lateral ventricle consistent with a nearly holohemispheric MCA/PCA territory insult. This appearance is stable from 2013. New area of restricted diffusion corresponding low ADC, in the RIGHT hemisphere involves the residual medial posterior temporal and occipital lobe, RIGHT thalamus,  RIGHT basal ganglia and regional white matter, consistent with acute infarction. No features to suggest acute hemorrhage. Generalized atrophy. Extensive small vessel disease throughout the white matter, more pronounced on the LEFT due to more visible brain substance. Extensive chronic microvascular ischemic changes also observed in the pons. Vascular: There is absent flow void in the RIGHT petrous and cavernous ICA. It is unclear if this is acute or chronic. Supraclinoid RICA is patent, as is the RIGHT MCA, presumably collateral flow. Skull and upper cervical spine: Normal marrow signal. Sinuses/Orbits: Negative. Other: None. IMPRESSION: Acute on chronic cerebral infarction, nonhemorrhagic, RIGHT cerebral hemisphere. Acute nonhemorrhagic infarction involves the basal ganglia, residual posterior temporal and occipital lobe, as well as regional white matter. There is absent flow in the skull base RIGHT internal carotid artery. While recent RIGHT ICA thrombosis may have been contributory to the acute infarction, there are no specific imaging features which would allow an estimate of acuity versus chronicity. Extensive chronic microvascular ischemic change, likely hypertensive related. Electronically Signed   By: Elsie Stain M.D.   On: 02/03/2019 16:21    Labs:  Basic Metabolic Panel: Recent Labs  Lab 02/23/19 1007  NA 138  K 3.9  CL 103  CO2 22  GLUCOSE 113*  BUN 13  CREATININE 0.73  CALCIUM 9.2    CBC: Recent Labs  Lab 02/23/19 1007  WBC 3.6*  NEUTROABS 2.3  HGB 14.0  HCT 45.6  MCV 81.7  PLT 215    CBG: No results for input(s): GLUCAP in the last 168 hours.   Family history.  Mother with hypertension father with hypertension.  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  Per chart review patient lives with significant other who provides 24-hour assistance as needed she also has a home health aide 8 hours a day 5 days a week.  Patient used a straight point cane  prior to admission.  Presented 02/04/2019 with headache, increased left side weakness and frequent falls.  CT and MRI showed acute on chronic cerebral infarction nonhemorrhagic right hemisphere.  Acute nonhemorrhagic infarction involving the basal ganglia, residual posterior temporal occipital lobe as well as regional white matter.  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  Echocardiogram with ejection fraction of 60% without embolus.  Urine drug screen positive for cocaine.  Neurology consulted maintain on aspirin 325 mg daily for CVA prophylaxis.  Subcutaneous Lovenox for DVT prophylaxis.  Tolerating a regular diet.  Therapy evaluations completed and patient was admitted for a comprehensive rehab program  Hospital Course: [name] was admitted to rehab 02/06/2019 for inpatient therapies to consist of PT, ST and OT at least three hours five days a week. Past admission physiatrist, therapy team and rehab RN have worked together to provide customized collaborative inpatient rehab.  Pertaining to patient right MCA infarction in the setting of right ICA acute on chronic occlusion.  Patient would follow-up outpatient interventional radiology as well as neurology.  Remain on aspirin therapy.  Subcutaneous Lovenox for DVT prophylaxis and no bleeding episodes.  Most recent follow-up cranial CT scan reviewed remained stable.  Patient with noted history of mood disorder schizophrenia and remained on scheduled Risperdal   Pain  managed with noted spasticity scheduled Zanaflex as well as Lyrica.  Attempted use of baclofen failed due to increased lethargy.  Consideration to be made for outpatient Botox injections for better spasticity control.  Blood pressure controlled with Norvasc.  She remained on Lipitor for hyperlipidemia.  Patient did have a history of polysubstance abuse positive cocaine and was provided counseling.  Physical exam.  Blood pressure 148/86 pulse 70 temperature 98 respirations 16 oxygen saturations 100% room air Constitutional.  No distress HEENT Head.  Normocephalic and atraumatic Eyes.  Pupils round and reactive to light EOMs normal no nystagmus Neck.  Supple nontender good range of motion no JVD Cardiac.  Normal rate and rhythm without gallop or murmur heard Respiratory.  Effort normal breath sounds normal no respiratory distress without wheezes GI.  Soft no distention nontender without rebound Neurological.  Patient is alert fair insight and awareness.  Normal language.  Speech was somewhat slurred.  Left central 7 and tongue deviation.  Left inattention.  Left upper and left lower extremity 0 out of 5.  Flexion contractures left wrist fingers elbows left heel cord.  DTRs 2+ on the left.  Underlying tone 2-3/4.  Right upper extremity 4-5 out of 5.  Rehab course: During patient's stay in rehab weekly team conferences were held to monitor patient's progress, set goals and discuss barriers to discharge. At admission, patient required max assist sit to stand and max assist squat pivot transfers.  Moderate assist lateral scoot transfers.  Max assist supine to sit moderate assist sit to supine.  Max assist lower body dressing max assist toilet transfers.  Moderate assist upper body dressing max assist lower body bathing moderate assist upper body bathing.  He/She  has had improvement in activity tolerance, balance, postural control as well as ability to compensate for deficits. He/She has had improvement in  functional use RUE/LUE  and RLE/LLE as well as improvement in awareness.  Gait training using right hand rail moderate assist for left lower extremity placement weight-shifting max cues for sequencing.  She did need max assist hands.  She did put on her pants and shirt with total assistance.  Dependent supine to sit transfers.  Total assist to reposition in recliner for upright tolerance and safety.  Due to need for physical assistance her significant other did not feel he can provide the necessary assistance at home and felt skilled nursing facility was needed.       Disposition: Discharged to [facility]    Diet: Regular diet  Special Instructions: No smoking or illicit drug use  Medications at discharge. 1.  Tylenol as needed 2.  Norvasc 7.5 mg p.o. daily 3.  Aspirin 325 mg p.o. daily 4.  Lipitor 40 mg p.o. daily 5.  Keppra 750 mg p.o. twice daily 6.  Protonix 40 mg p.o. daily 7.  Lyrica 75 mg p.o. daily 8.  Risperdal 1 mg p.o. daily 9.  Senokot S1 tablet p.o. twice daily 10.  Zanaflex 8 mg p.o. twice daily  Discharge Instructions    Ambulatory referral to Neurology   Complete by:  As directed    An appointment is requested in approximately 4 weeks right MCA infarction   Ambulatory referral to Physical Medicine Rehab   Complete by:  As directed    Follow-up one month right MCA infarction. Patient for skilled nursing facility placement       Contact information for follow-up providers    Marcello Fennel, MD Follow up.   Specialty:  Physical Medicine and Rehabilitation Why:  office to call for appointment Contact information: [address] [place] [address] [phone]        Lorenda Ishihara, MD .   Specialty:  Internal Medicine Contact information: [address] [place] [address] [phone]            Contact information for after-discharge care    Destination    [facility] Preferred SNF .    Service:  Skilled Nursing Contact information: [address] [place] [address] [phone]                  Signed: Charlton Amor 03/01/2019, 10:01 AM Patient seen and examined by me on day of discharge. Maryla Morrow, MD, ABPMR

## 2019-03-01 NOTE — Progress Notes (Signed)
Physical Therapy Session Note  Patient Details  Name: [name] MRN: [id number] Date of Birth: December 13, 1955  Today's Date: 03/01/2019 PT Individual Time: 0805-0900 PT Individual Time Calculation (min): 55 min   Short Term Goals: Week 3:  PT Short Term Goal 1 (Week 3): =LTGs due to ELOS  Skilled Therapeutic Interventions/Progress Updates: Pt presented in bed agreeable to therapy. Pt denies pain during session. Performed bed mobility supine to sit with use of bed features with modA with pt requiring increased time to achieve neutral sitting balance due to increased sway. PTA threaded pants maxA and pt performed STS with HW and minA to allow PTA to pull pants over hips. Pt then performed squat pivot transfer to w/c modA. Pt transported down hallway and then had pt participate in w/c mobility for approx 83f. Pt able to initiate and maintain use of RUE on wheel rim however required mod to max cues for use of RLE and to maintain straight trajectory. Participated in gait training at wall rail approx 10 ft. Pt required modA for advancement of LLE, wt shifting to L and PTA blocking L knee. Pt also required tactile cues for improving posture, looking forward, and keeping hips forward. Pt noted to have increased forward flexed posture with fatigue. Pt then transported to day room and participated in Cybex Kinetron 70cm/sec x 10 cycles, pt required max cueing for participation due to fatigue. Pt transported back to room at end of session and performed squat pivot to bed mod/maxA. Pt noted to have LOB in sitting with lean to L requiring assist from PTA for recovery. Pt performed sit to supine maxA and repositioned to comfort. Pt left with bed alarm on, call bell within reach and needs met.      Therapy Documentation Precautions:  Precautions Precautions: Fall Precaution Comments: LUE flexor tone, LLE extensor tone Restrictions Weight Bearing Restrictions: No General:   Vital Signs:  Pain: Pain  Assessment Pain Scale: 0-10 Pain Score: 0-No pain Mobility: Bed Mobility Bed Mobility: Supine to Sit;Sit to Supine Supine to Sit: Moderate Assistance - Patient 50-74% Sit to Supine: Moderate Assistance - Patient 50-74% Transfers Transfers: Sit to Stand;Stand Pivot Transfers Sit to Stand: Minimal Assistance - Patient > 75% Stand Pivot Transfers: Moderate Assistance - Patient 50 - 74% Stand Pivot Transfer Details: Verbal cues for sequencing;Verbal cues for precautions/safety;Verbal cues for safe use of DME/AE Transfer (Assistive device): Hemi-walker Locomotion : Gait Ambulation: Yes Gait Assistance: Moderate Assistance - Patient 50-74% Gait Distance (Feet): 10 Feet Assistive device: Other (Comment)(wall rail) Gait Assistance Details: Tactile cues for initiation;Tactile cues for sequencing;Tactile cues for weight shifting;Tactile cues for posture;Tactile cues for placement;Tactile cues for weight beaing;Manual facilitation for weight shifting;Verbal cues for gait pattern;Manual facilitation for placement;Manual facilitation for weight bearing  Trunk/Postural Assessment : Cervical Assessment Cervical Assessment: Exceptions to WFL(forward head) Thoracic Assessment Thoracic Assessment: Exceptions to WFL(rounded shoulders) Lumbar Assessment Lumbar Assessment: Exceptions to WFL(posterior pelvic tilt) Postural Control Postural Control: Deficits on evaluation  Balance: Balance Balance Assessed: Yes Static Sitting Balance Static Sitting - Balance Support: Feet supported Static Sitting - Level of Assistance: 4: Min assist Static Sitting - Comment/# of Minutes: Requires constant cueing for midline orientation Dynamic Sitting Balance Dynamic Sitting - Balance Support: During functional activity Dynamic Sitting - Level of Assistance: 4: Min assist Sitting balance - Comments: Pt is able to maintain static sitting with max cueing, frequently loses balance L and posterior but is able to correct   Static Standing Balance Static Standing - Balance  Support: During functional activity Static Standing - Level of Assistance: 4: Min assist Dynamic Standing Balance Dynamic Standing - Balance Support: During functional activity Dynamic Standing - Level of Assistance: 3: Mod assist Exercises:   Other Treatments:      Therapy/Group: Individual Therapy  Taleia Sadowski  Alen Matheson, PTA  03/01/2019, 12:16 PM

## 2019-03-01 NOTE — Plan of Care (Signed)
  Problem: Consults Goal: RH GENERAL PATIENT EDUCATION Description See Patient Education module for education specifics. Outcome: Progressing   Problem: RH BOWEL ELIMINATION Goal: RH STG MANAGE BOWEL WITH ASSISTANCE Description STG Manage Bowel with Assistance. Max assist  Outcome: Progressing   Problem: RH BLADDER ELIMINATION Goal: RH STG MANAGE BLADDER WITH ASSISTANCE Description STG Manage Bladder With Assistance. Max  Outcome: Progressing   Problem: RH SKIN INTEGRITY Goal: RH STG SKIN FREE OF INFECTION/BREAKDOWN Description Skin free of infection with max assist  Outcome: Progressing   Problem: RH SAFETY Goal: RH STG ADHERE TO SAFETY PRECAUTIONS W/ASSISTANCE/DEVICE Description STG Adhere to Safety Precautions With Assistance/Device. Mod assit  Outcome: Progressing   Problem: RH PAIN MANAGEMENT Goal: RH STG PAIN MANAGED AT OR BELOW PT'S PAIN GOAL Description Pain less than 3  Outcome: Progressing   Problem: RH KNOWLEDGE DEFICIT GENERAL Goal: RH STG INCREASE KNOWLEDGE OF SELF CARE AFTER HOSPITALIZATION Description Family/caregiver will be able to describe plan of care following hospitalization including medication management with handouts/cues  Outcome: Progressing   Problem: Consults Goal: RH STROKE PATIENT EDUCATION Description See Patient Education module for education specifics  Outcome: Progressing

## 2019-03-02 ENCOUNTER — Inpatient Hospital Stay (HOSPITAL_COMMUNITY): Payer: Medicare Other | Admitting: Speech Pathology

## 2019-03-02 ENCOUNTER — Inpatient Hospital Stay (HOSPITAL_COMMUNITY): Payer: Medicare Other | Admitting: Physical Therapy

## 2019-03-02 LAB — CBC WITH DIFFERENTIAL/PLATELET
Abs Immature Granulocytes: 0.01 10*3/uL (ref 0.00–0.07)
Basophils Absolute: 0 10*3/uL (ref 0.0–0.1)
Basophils Relative: 0 %
Eosinophils Absolute: 0 10*3/uL (ref 0.0–0.5)
Eosinophils Relative: 1 %
HCT: 41.1 % (ref 36.0–46.0)
Hemoglobin: 13.1 g/dL (ref 12.0–15.0)
Immature Granulocytes: 0 %
Lymphocytes Relative: 30 %
Lymphs Abs: 1.1 10*3/uL (ref 0.7–4.0)
MCH: 25.9 pg — ABNORMAL LOW (ref 26.0–34.0)
MCHC: 31.9 g/dL (ref 30.0–36.0)
MCV: 81.2 fL (ref 80.0–100.0)
Monocytes Absolute: 0.5 10*3/uL (ref 0.1–1.0)
Monocytes Relative: 13 %
Neutro Abs: 2 10*3/uL (ref 1.7–7.7)
Neutrophils Relative %: 56 %
Platelets: 182 10*3/uL (ref 150–400)
RBC: 5.06 MIL/uL (ref 3.87–5.11)
RDW: 13.5 % (ref 11.5–15.5)
WBC: 3.6 10*3/uL — ABNORMAL LOW (ref 4.0–10.5)
nRBC: 0 % (ref 0.0–0.2)

## 2019-03-02 MED ORDER — AMLODIPINE BESYLATE 2.5 MG PO TABS: 7.5000 mg | ORAL_TABLET | Freq: Every day | ORAL | Status: DC

## 2019-03-02 MED ORDER — LEVETIRACETAM 750 MG PO TABS: 750.0000 mg | ORAL_TABLET | Freq: Two times a day (BID) | ORAL | Status: AC

## 2019-03-02 MED ORDER — PREGABALIN 75 MG PO CAPS: 75.0000 mg | ORAL_CAPSULE | Freq: Three times a day (TID) | ORAL | 0 refills | Status: DC

## 2019-03-02 MED ORDER — RISPERIDONE 1 MG PO TABS: 1.0000 mg | ORAL_TABLET | Freq: Every day | ORAL | 0 refills | Status: DC

## 2019-03-02 MED ORDER — TIZANIDINE HCL 4 MG PO TABS: 8.0000 mg | ORAL_TABLET | Freq: Two times a day (BID) | ORAL | 0 refills | Status: DC

## 2019-03-02 MED ORDER — ACETAMINOPHEN 325 MG PO TABS: 650.0000 mg | ORAL_TABLET | ORAL | Status: AC | PRN

## 2019-03-02 MED ORDER — PREGABALIN 75 MG PO CAPS: 75.0000 mg | ORAL_CAPSULE | Freq: Every day | ORAL | 0 refills | Status: AC

## 2019-03-02 NOTE — Progress Notes (Signed)
Patient picked up by EMS transport. All belongings sent with her. IV removed prior to discharge.

## 2019-03-02 NOTE — Progress Notes (Signed)
Speech Language Pathology Discharge Summary  Patient Details  Name: [name] MRN: [id number] Date of Birth: [birth year]  Today's Date: 03/02/2019  Patient has met 1 of 3 long term goals.  Patient to discharge at [facility] Max;Mod level.   Reasons goals not met: Patient continues to require overall Max A multimodal cues for sustained attention and functional problem solving    Clinical Impression/Discharge Summary: Patient has made minimal progress and has met 1 of 3 LTGs this admission. Currently, patient requires overall supervision verbal cues for use of speech intelligibility strategies to achieve ~100% intelligibility at the sentence level. Patient also requires overall Max A multimodal cues to complete functional and familiar tasks safely in regards to problem solving and attention. Patient and caregiver education is complete and patient's caregiver cannot provide the necessary assistance needed at this time, therefore, she will discharge to a SNF. Patient would benefit from f/u SLP services to maximize her cognitive functioning and overall functional independence in order to reduce caregiver burden.   Care Partner:  Caregiver Able to Provide Assistance: No  Type of Caregiver Assistance: Physical;Cognitive  Recommendation:  Skilled Nursing facility  Rationale for SLP Follow Up: Reduce caregiver burden;Maximize cognitive function and independence   Equipment: N/A   Reasons for discharge: Discharged from hospital   Patient/Family Agrees with Progress Made and Goals Achieved: Yes    [name] 03/02/2019, 6:43 AM

## 2019-03-02 NOTE — Progress Notes (Signed)
Social Work  Discharge Note  The overall goal for the admission was met for:   Discharge location: No - plan changed to SNF as pt's care needs continued to be too great for family to manage at home.  Length of Stay: No - 24 days  Discharge activity level: No  Home/community participation: No  Services provided included: MD, RD, PT, OT, SLP, RN, TR, Pharmacy and SW  Financial Services: Medicare and Medicaid  Follow-up services arranged: Other: SNF at [facility] (or additional information):    Contact person:  Boyfriend, Raelyn Number at [phone]  Patient/Family verbalized understanding of follow-up arrangements:  yes  Individual responsible for coordination of the follow-up plan: pt/ BF  Confirmed correct DME delivered:  NA    [name]

## 2019-03-02 NOTE — Progress Notes (Signed)
Report called to [place] at [facility]

## 2019-03-05 ENCOUNTER — Emergency Department (HOSPITAL_COMMUNITY)
Admission: EM | Admit: 2019-03-05 | Discharge: 2019-03-05 | Disposition: A | Discharge status: Skilled Nursing Facility | Payer: Medicare Other | Attending: Emergency Medicine | Admitting: Emergency Medicine

## 2019-03-05 ENCOUNTER — Emergency Department (HOSPITAL_COMMUNITY): Payer: Medicare Other

## 2019-03-05 ENCOUNTER — Other Ambulatory Visit: Payer: Self-pay

## 2019-03-05 ENCOUNTER — Encounter (HOSPITAL_COMMUNITY): Payer: Self-pay

## 2019-03-05 DIAGNOSIS — R51 Headache: Secondary | ICD-10-CM | POA: Diagnosis not present

## 2019-03-05 DIAGNOSIS — M545 Low back pain: Secondary | ICD-10-CM | POA: Insufficient documentation

## 2019-03-05 DIAGNOSIS — W06XXXA Fall from bed, initial encounter: Secondary | ICD-10-CM | POA: Diagnosis not present

## 2019-03-05 DIAGNOSIS — Z79899 Other long term (current) drug therapy: Secondary | ICD-10-CM | POA: Insufficient documentation

## 2019-03-05 DIAGNOSIS — F17200 Nicotine dependence, unspecified, uncomplicated: Secondary | ICD-10-CM | POA: Insufficient documentation

## 2019-03-05 DIAGNOSIS — I10 Essential (primary) hypertension: Secondary | ICD-10-CM | POA: Diagnosis not present

## 2019-03-05 DIAGNOSIS — Z7982 Long term (current) use of aspirin: Secondary | ICD-10-CM | POA: Diagnosis not present

## 2019-03-05 DIAGNOSIS — Z8673 Personal history of transient ischemic attack (TIA), and cerebral infarction without residual deficits: Secondary | ICD-10-CM | POA: Insufficient documentation

## 2019-03-05 LAB — CBC WITH DIFFERENTIAL/PLATELET
Abs Immature Granulocytes: 0.01 10*3/uL (ref 0.00–0.07)
Basophils Absolute: 0 10*3/uL (ref 0.0–0.1)
Basophils Relative: 1 %
Eosinophils Absolute: 0.1 10*3/uL (ref 0.0–0.5)
Eosinophils Relative: 2 %
HCT: 47.1 % — ABNORMAL HIGH (ref 36.0–46.0)
Hemoglobin: 14.2 g/dL (ref 12.0–15.0)
Immature Granulocytes: 0 %
Lymphocytes Relative: 32 %
Lymphs Abs: 1.2 10*3/uL (ref 0.7–4.0)
MCH: 25.6 pg — ABNORMAL LOW (ref 26.0–34.0)
MCHC: 30.1 g/dL (ref 30.0–36.0)
MCV: 84.9 fL (ref 80.0–100.0)
Monocytes Absolute: 0.4 10*3/uL (ref 0.1–1.0)
Monocytes Relative: 11 %
Neutro Abs: 2.1 10*3/uL (ref 1.7–7.7)
Neutrophils Relative %: 54 %
Platelets: 185 10*3/uL (ref 150–400)
RBC: 5.55 MIL/uL — ABNORMAL HIGH (ref 3.87–5.11)
RDW: 13.4 % (ref 11.5–15.5)
WBC: 3.8 10*3/uL — ABNORMAL LOW (ref 4.0–10.5)
nRBC: 0 % (ref 0.0–0.2)

## 2019-03-05 LAB — BASIC METABOLIC PANEL
Anion gap: 8 (ref 5–15)
BUN: 15 mg/dL (ref 8–23)
CO2: 26 mmol/L (ref 22–32)
Calcium: 8.7 mg/dL — ABNORMAL LOW (ref 8.9–10.3)
Chloride: 107 mmol/L (ref 98–111)
Creatinine, Ser: 0.9 mg/dL (ref 0.44–1.00)
GFR calc Af Amer: >60 mL/min (ref >60)
GFR calc non Af Amer: >60 mL/min (ref >60)
Glucose, Bld: 93 mg/dL (ref 70–99)
Potassium: 3.7 mmol/L (ref 3.5–5.1)
Sodium: 141 mmol/L (ref 135–145)

## 2019-03-05 MED ORDER — ACETAMINOPHEN 500 MG PO TABS
500.0000 mg | ORAL_TABLET | Freq: Once | ORAL | Status: AC
  Administered 2019-03-05: 500 mg via ORAL
  Filled 2019-03-05: qty 1

## 2019-03-05 NOTE — ED Notes (Signed)
Called [employer] report called Traceanne, and [facility] called.

## 2019-03-05 NOTE — ED Triage Notes (Signed)
Pt has left sided paralysis d/t recent CVA

## 2019-03-05 NOTE — Discharge Instructions (Signed)
Return for worsening confusion.  Talk with your doctor about your multiple medications as this could be making you fatigued.

## 2019-03-05 NOTE — ED Notes (Signed)
Bed: WA20 Expected date:  Expected time:  Means of arrival:  Comments: EMS-fall/weakness

## 2019-03-05 NOTE — ED Provider Notes (Signed)
[facility]-EMERGENCY DEPT Provider Note   CSN: [id number] Arrival date & time: 03/05/19  1414    History   Chief Complaint Chief Complaint  Patient presents with  . Fall    HPI [name] is a 63 y.o. female.     63 yo F with a chief complaint of a fall out of bed.  This happened yesterday.  Patient apparently rolled out of bed was witnessed by the nursing staff.  Patient states that she struck the right side of her head and also hurt her right low back.  Denies any other injury.  Denies chest pain or shortness of breath denies abdominal pain vomiting or diarrhea.  Denies loss of bowel or bladder denies loss of peritoneal sensation.  The patient unfortunately had a recent right MCA stroke that left her with significant weakness to the left upper extremity and left lower extremity requiring a prolonged rehab stay and she was discharged about 3 days ago to a nursing home because she was requiring 24-hour assistance.  Per the nursing staff they feel she is slightly more sleepy than she had been.  The history is provided by the patient.  Fall  This is a new problem. The current episode started yesterday. The problem occurs constantly. The problem has not changed since onset.Associated symptoms include headaches. Pertinent negatives include no chest pain, no abdominal pain and no shortness of breath. Nothing aggravates the symptoms. Nothing relieves the symptoms. She has tried nothing for the symptoms. The treatment provided no relief.    Past Medical History:  Diagnosis Date  . Blood transfusion without reported diagnosis   . CHF (congestive heart failure) (HCC)   . Hypertension   . Stroke [facility])     Patient Active Problem List   Diagnosis Date Noted  . Seizures (HCC)   . FUO (fever of unknown origin)   . Hyponatremia   . Spastic hemiparesis (HCC)   . Vascular headache   . Hypertensive crisis   . Thrombocytopenia (HCC)   . Leukopenia   .  Hypoalbuminemia due to protein-calorie malnutrition (HCC)   . Hypokalemia   . Prediabetes   . Spastic hemiplegia affecting nondominant side (HCC)   . Chronic diastolic congestive heart failure (HCC)   . Substance abuse (HCC)   . Essential hypertension   . Labile blood pressure   . Aneurysm, cerebral, nonruptured 02/06/2019  . Acute right MCA stroke (HCC) 02/06/2019  . 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 Right carotid artery occlusion 02/05/2019  . Left hemiparesis (HCC) 02/05/2019  . CVA (cerebral vascular accident) (HCC) 02/03/2019    Past Surgical History:  Procedure Laterality Date  . ABDOMINAL SURGERY       OB History   No obstetric history on file.      Home Medications    Prior to Admission medications   Medication Sig Start Date End Date Taking? Authorizing Provider  acetaminophen (TYLENOL) 325 MG tablet Take 2 tablets (650 mg total) by mouth every 4 (four) hours as needed for mild pain (or temp > 37.5 C (99.5 F)). 03/02/19   Angiulli, Mcarthur Rossetti, PA-C  amLODipine (NORVASC) 2.5 MG tablet Take 3 tablets (7.5 mg total) by mouth daily. 03/02/19   Angiulli, Mcarthur Rossetti, PA-C  aspirin 325 MG tablet Take 1 tablet (325 mg total) by mouth daily. 02/07/19   Arnetha Courser, MD  atorvastatin (LIPITOR) 40 MG tablet Take 1 tablet (40 mg total) by mouth daily at 6 PM. 02/06/19  Rehman, Areeg N, DO  levETIRAcetam (KEPPRA) 750 MG tablet Take 1 tablet (750 mg total) by mouth 2 (two) times daily. 03/02/19   Angiulli, Mcarthur Rossetti, PA-C  pantoprazole (PROTONIX) 40 MG tablet Take 40 mg by mouth daily. 12/23/18   [provider]  pregabalin (LYRICA) 75 MG capsule Take 1 capsule (75 mg total) by mouth daily. 03/02/19   Angiulli, Mcarthur Rossetti, PA-C  risperiDONE (RISPERDAL) 1 MG tablet Take 1 tablet (1 mg total) by mouth at bedtime. 03/02/19   Angiulli, Mcarthur Rossetti, PA-C  tiZANidine (ZANAFLEX) 4 MG tablet Take 2 tablets (8 mg total) by mouth 2 (two) times daily. 03/02/19   Angiulli, Mcarthur Rossetti, PA-C     Family History Family History  Problem Relation Age of Onset  . Hypertension Mother   . Hypertension Father     Social History Social History   Tobacco Use  . Smoking status: Current Every Day Smoker  . Smokeless tobacco: Never Used  Substance Use Topics  . Alcohol use: Never    Frequency: Never  . Drug use: Never     Allergies   Patient has no known allergies.   Review of Systems Review of Systems  Constitutional: Negative for chills and fever.  HENT: Negative for congestion and rhinorrhea.   Eyes: Negative for redness and visual disturbance.  Respiratory: Negative for shortness of breath and wheezing.   Cardiovascular: Negative for chest pain and palpitations.  Gastrointestinal: Negative for abdominal pain, nausea and vomiting.  Genitourinary: Negative for dysuria and urgency.  Musculoskeletal: Positive for back pain. Negative for arthralgias and myalgias.  Skin: Negative for pallor and wound.  Neurological: Positive for headaches. Negative for dizziness.     Physical Exam Updated Vital Signs BP 130/75 (BP Location: Right Arm)   Pulse 86   Temp 97.9 F (36.6 C) (Oral)   Resp 16   Ht  (1.575 m)   Wt 49.9 kg   SpO2 97%   BMI 20.12 kg/m   Physical Exam Vitals signs and nursing note reviewed.  Constitutional:      General: She is not in acute distress.    Appearance: She is well-developed. She is not diaphoretic.  HENT:     Head: Normocephalic.     Comments: Hematoma to the right frontal region. Eyes:     Pupils: Pupils are equal, round, and reactive to light.  Neck:     Musculoskeletal: Normal range of motion and neck supple.  Cardiovascular:     Rate and Rhythm: Normal rate and regular rhythm.     Heart sounds: No murmur. No friction rub. No gallop.   Pulmonary:     Effort: Pulmonary effort is normal.     Breath sounds: No wheezing or rales.  Abdominal:     General: There is no distension.     Palpations: Abdomen is soft.     Tenderness:  There is no abdominal tenderness.  Musculoskeletal:        General: Tenderness present.     Comments: No appreciable midline spinal tenderness.  Rotates her head 45 degrees in either direction.  Left-sided weakness.  Skin:    General: Skin is warm and dry.  Neurological:     Mental Status: She is alert and oriented to person, place, and time.     Comments: Slightly slow to respond.  Psychiatric:        Behavior: Behavior normal.      ED Treatments / Results  Labs (all labs ordered are  listed, but only abnormal results are displayed) Labs Reviewed  CBC WITH DIFFERENTIAL/PLATELET  BASIC METABOLIC PANEL    EKG EKG Interpretation  Date/Time:  Thursday March 05 2019 14:30:03 EDT Ventricular Rate:  86 PR Interval:    QRS Duration: 87 QT Interval:  378 QTC Calculation: 453 R Axis:   -36 Text Interpretation:  Sinus rhythm Left axis deviation Probable anteroseptal infarct, old No significant change since last tracing Confirmed by Melene PlanFloyd, Shamekia Tippets [phone]([id number]) on 03/05/2019 2:37:58 PM   Radiology Dg Lumbar Spine Complete  Result Date: 03/05/2019 CLINICAL DATA:  Low back pain after fall yesterday. EXAM: LUMBAR SPINE - COMPLETE 4+ VIEW COMPARISON:  CT scan of March 07, 2007. FINDINGS: No fracture or spondylolisthesis is noted. Mild degenerative disc disease is noted at L3-4, L4-5 and L5-S1 with anterior osteophyte formation. IMPRESSION: Mild multilevel degenerative disc disease. No acute abnormality seen in the lumbar spine. Electronically Signed   By: Lupita RaiderJames  Green Jr M.D.   On: 03/05/2019 15:10   Ct Head Wo Contrast  Result Date: 03/05/2019 CLINICAL DATA:  63 year old female status post fall yesterday EXAM: CT HEAD WITHOUT CONTRAST TECHNIQUE: Contiguous axial images were obtained from the base of the skull through the vertex without intravenous contrast. COMPARISON:  Prior head CT 02/22/2019 FINDINGS: Brain: No evidence of acute infarction, hemorrhage, hydrocephalus, extra-axial collection or  mass lesion/mass effect. Stable sequelae of prior large territory MCA and PCA territory infarcts without interval change compared to prior imaging. Moderate to advanced chronic microvascular ischemic white matter disease also remains stable. Vascular: No hyperdense vessel or unexpected calcification. Skull: Normal. Negative for fracture or focal lesion. Sinuses/Orbits: No acute finding. Other: None. IMPRESSION: 1. No acute intracranial abnormality. 2. Unchanged appearance of remote large right MCA and PCA territory infarcts as well as moderately advanced chronic microvascular ischemic white matter disease. Electronically Signed   By: Malachy MoanHeath  McCullough M.D.   On: 03/05/2019 15:26    Procedures Procedures (including critical care time)  Medications Ordered in ED Medications - No data to display   Initial Impression / Assessment and Plan / ED Course  I have reviewed the triage vital signs and the nursing notes.  Pertinent labs & imaging results that were available during my care of the patient were reviewed by me and considered in my medical decision making (see chart for details).        63 yo F with a chief complaint of fall out of bed.  Patient has been on blood thinners so she has been essentially bedbound.  Will obtain a CT of the head plain film of the low back.  There is some concern that she is maybe more sleepy than she has been.   She is on multiple sedating medications, show was noted in 1 of her prior notes that they did a trial of baclofen but stopped due to increased lethargy.  This may be a medication reaction.  She had a CBC done from a couple days ago that was unremarkable.  CT of the head is without new acute intracranial bleed or stroke.  Plain film of the L-spine viewed by me without fracture.  Awaiting lab work.  I discussed case with Dr. Jeraldine LootsLockwood I signed the patient out him, please see his note for further details care in the ED.  The patients results and plan were reviewed  and discussed.   Any x-rays performed were independently reviewed by myself.   Differential diagnosis were considered with the presenting HPI.  Medications - No data  to display  Vitals:   03/05/19 1431  BP: 130/75  Pulse: 86  Resp: 16  Temp: 97.9 F (36.6 C)  TempSrc: Oral  SpO2: 97%  Weight: 49.9 kg  Height:  (1.575 m)    Final diagnoses:  Fall from bed, initial encounter       Final Clinical Impressions(s) / ED Diagnoses   Final diagnoses:  Fall from bed, initial encounter    ED Discharge Orders    None       Melene Plan, DO 03/05/19 1614

## 2019-03-05 NOTE — ED Provider Notes (Signed)
Patient awake, alert, hemodynamically unremarkable.  Findings reassuring, labs unremarkable, x-ray without acute new findings.   Gerhard Munch, MD 03/05/19 [phone]

## 2019-03-05 NOTE — ED Triage Notes (Signed)
Pt arrived via EMS from [facility].  Pt had a witness fall yesterday, rolling off the bed per staff. Pt hit the rt side of her head, no LOC. Pt is alert and oriented x 4 and is verbally 8/10 with increased weakness.    141/95 HR 89, RR 16, 97% RA  Temp 98.4,  CBG 177  22G rt hand

## 2019-03-05 NOTE — ED Notes (Signed)
UNSUCCESSFUL LAB COLLECTION ATTEMPT 

## 2019-04-11 ENCOUNTER — Encounter (HOSPITAL_COMMUNITY): Payer: Self-pay | Admitting: Emergency Medicine

## 2019-04-11 ENCOUNTER — Emergency Department (HOSPITAL_COMMUNITY)
Admission: EM | Admit: 2019-04-11 | Discharge: 2019-04-13 | Disposition: A | Discharge status: Home / Self Care | Payer: Medicare Other | Source: Home / Self Care | Attending: Emergency Medicine | Admitting: Emergency Medicine

## 2019-04-11 ENCOUNTER — Other Ambulatory Visit: Payer: Self-pay

## 2019-04-11 ENCOUNTER — Emergency Department (HOSPITAL_COMMUNITY)
Admission: EM | Admit: 2019-04-11 | Discharge: 2019-04-11 | Payer: Medicare Other | Attending: Emergency Medicine | Admitting: Emergency Medicine

## 2019-04-11 ENCOUNTER — Encounter (HOSPITAL_COMMUNITY): Payer: Self-pay | Admitting: Pharmacy Technician

## 2019-04-11 DIAGNOSIS — I5032 Chronic diastolic (congestive) heart failure: Secondary | ICD-10-CM | POA: Insufficient documentation

## 2019-04-11 DIAGNOSIS — Z7982 Long term (current) use of aspirin: Secondary | ICD-10-CM | POA: Insufficient documentation

## 2019-04-11 DIAGNOSIS — G8194 Hemiplegia, unspecified affecting left nondominant side: Secondary | ICD-10-CM | POA: Insufficient documentation

## 2019-04-11 DIAGNOSIS — G40909 Epilepsy, unspecified, not intractable, without status epilepticus: Secondary | ICD-10-CM | POA: Insufficient documentation

## 2019-04-11 DIAGNOSIS — R531 Weakness: Secondary | ICD-10-CM

## 2019-04-11 DIAGNOSIS — Z8673 Personal history of transient ischemic attack (TIA), and cerebral infarction without residual deficits: Secondary | ICD-10-CM | POA: Diagnosis not present

## 2019-04-11 DIAGNOSIS — M6281 Muscle weakness (generalized): Secondary | ICD-10-CM | POA: Insufficient documentation

## 2019-04-11 DIAGNOSIS — F17210 Nicotine dependence, cigarettes, uncomplicated: Secondary | ICD-10-CM | POA: Insufficient documentation

## 2019-04-11 DIAGNOSIS — F17200 Nicotine dependence, unspecified, uncomplicated: Secondary | ICD-10-CM | POA: Insufficient documentation

## 2019-04-11 DIAGNOSIS — I110 Hypertensive heart disease with heart failure: Secondary | ICD-10-CM | POA: Insufficient documentation

## 2019-04-11 DIAGNOSIS — Z1159 Encounter for screening for other viral diseases: Secondary | ICD-10-CM | POA: Diagnosis not present

## 2019-04-11 DIAGNOSIS — Z789 Other specified health status: Secondary | ICD-10-CM

## 2019-04-11 DIAGNOSIS — Z79899 Other long term (current) drug therapy: Secondary | ICD-10-CM | POA: Insufficient documentation

## 2019-04-11 DIAGNOSIS — I10 Essential (primary) hypertension: Secondary | ICD-10-CM | POA: Insufficient documentation

## 2019-04-11 DIAGNOSIS — Z742 Need for assistance at home and no other household member able to render care: Secondary | ICD-10-CM | POA: Insufficient documentation

## 2019-04-11 LAB — SARS CORONAVIRUS 2 BY RT PCR (HOSPITAL ORDER, PERFORMED IN ~~LOC~~ HOSPITAL LAB): SARS Coronavirus 2: NEGATIVE

## 2019-04-11 MED ORDER — HYDROCODONE-ACETAMINOPHEN 5-325 MG PO TABS
1.0000 | ORAL_TABLET | Freq: Once | ORAL | Status: AC
  Administered 2019-04-11: 22:00:00 1 via ORAL
  Filled 2019-04-11: qty 1

## 2019-04-11 NOTE — ED Triage Notes (Signed)
Pt arrives via ems from home . Pt sent here for evaluation for long term care. Discharged from [facility] 2 hours ago. Pt has remained in wheelchair since dc. Pt unable to perform adl independently. VSS with ems.  L sided deficits from [facility].

## 2019-04-11 NOTE — ED Notes (Signed)
Pt called family 

## 2019-04-11 NOTE — Discharge Instructions (Addendum)
[name] COVID test was negative.   Thank you for allowing [place] to participate in your care today.

## 2019-04-11 NOTE — ED Notes (Signed)
Lunch Tray Ordered @ 1347-per RN called by Marylene Land

## 2019-04-11 NOTE — ED Provider Notes (Signed)
[facility] EMERGENCY DEPARTMENT Provider Note   CSN: [id number] Arrival date & time: 04/11/19  1837    History   Chief Complaint No chief complaint on file.   HPI [name] is a 63 y.o. female.     Patient is a 63 year old female with past medical history of seizure disorder, multiple strokes, hypertension, left hemiparesis who presents the emergency department for placement issue.  Patient was seen earlier today for generalized weakness and inability to care for herself.  Social work was consulted and she was subsequently discharged back to [facility] health care nursing facility after normal work-up in the emergency department.  Patient was immediately brought back to the emergency department.  EMS reported that [facility] care facility stated that the patient no longer had a bed at the facility.  Patient reports that they had discharged her back home to her boyfriend because they thought she had improved.  Patient reports that her boyfriend cannot take care of her.  She reports that she cannot perform any of her ADLs and needs extreme assistance at home.  Reports that she has no other family that can help her out.  Patient reports that she has no specific complaints other than chronic back pain which she has had since a fall back in April.  At that time an x-ray was done and no acute findings.  Denies any new recent falls or trauma.  She had a negative COVID test earlier in the emergency department.     Past Medical History:  Diagnosis Date  . Blood transfusion without reported diagnosis   . CHF (congestive heart failure) (HCC)   . Hypertension   . Stroke [facility])     Patient Active Problem List   Diagnosis Date Noted  . Seizures (HCC)   . FUO (fever of unknown origin)   . Hyponatremia   . Spastic hemiparesis (HCC)   . Vascular headache   . Hypertensive crisis   . Thrombocytopenia (HCC)   . Leukopenia   . Hypoalbuminemia due to protein-calorie  malnutrition (HCC)   . Hypokalemia   . Prediabetes   . Spastic hemiplegia affecting nondominant side (HCC)   . Chronic diastolic congestive heart failure (HCC)   . Substance abuse (HCC)   . Essential hypertension   . Labile blood pressure   . Aneurysm, cerebral, nonruptured 02/06/2019  . Acute right MCA stroke (HCC) 02/06/2019  . 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 Right carotid artery occlusion 02/05/2019  . Left hemiparesis (HCC) 02/05/2019  . CVA (cerebral vascular accident) (HCC) 02/03/2019    Past Surgical History:  Procedure Laterality Date  . ABDOMINAL SURGERY       OB History   No obstetric history on file.      Home Medications    Prior to Admission medications   Medication Sig Start Date End Date Taking? Authorizing Provider  acetaminophen (TYLENOL) 325 MG tablet Take 2 tablets (650 mg total) by mouth every 4 (four) hours as needed for mild pain (or temp > 37.5 C (99.5 F)). 03/02/19   Angiulli, Mcarthur Rossetti, PA-C  amLODipine (NORVASC) 2.5 MG tablet Take 3 tablets (7.5 mg total) by mouth daily. 03/02/19   Angiulli, Mcarthur Rossetti, PA-C  aspirin 325 MG tablet Take 1 tablet (325 mg total) by mouth daily. 02/07/19   Arnetha Courser, MD  atorvastatin (LIPITOR) 40 MG tablet Take 1 tablet (40 mg total) by mouth daily at 6 PM. Patient taking differently: Take 40 mg by mouth  at bedtime.  02/06/19   Rehman, Areeg N, DO  levETIRAcetam (KEPPRA) 750 MG tablet Take 1 tablet (750 mg total) by mouth 2 (two) times daily. 03/02/19   Angiulli, Mcarthur Rossetti, PA-C  nicotine (NICODERM CQ - DOSED IN MG/24 HOURS) 14 mg/24hr patch Place 14 mg onto the skin daily.    [provider]  pantoprazole (PROTONIX) 40 MG tablet Take 40 mg by mouth daily. 12/23/18   [provider]  pregabalin (LYRICA) 75 MG capsule Take 1 capsule (75 mg total) by mouth daily. Patient taking differently: Take 75 mg by mouth every 12 (twelve) hours.  03/02/19   Angiulli, Mcarthur Rossetti, PA-C  risperiDONE (RISPERDAL) 0.5  MG tablet Take 0.5 mg by mouth 2 (two) times daily. 8am, 4pm    [provider]  risperiDONE (RISPERDAL) 1 MG tablet Take 1 tablet (1 mg total) by mouth at bedtime. Patient not taking: Reported on 04/11/2019 03/02/19   Angiulli, Mcarthur Rossetti, PA-C  senna-docusate (SENNA S) 8.6-50 MG tablet Take 1 tablet by mouth 2 (two) times daily.    [provider]  sertraline (ZOLOFT) 25 MG tablet Take 25 mg by mouth daily.    [provider]  tiZANidine (ZANAFLEX) 4 MG tablet Take 2 tablets (8 mg total) by mouth 2 (two) times daily. Patient taking differently: Take 8 mg by mouth 3 (three) times daily. For spastic hemiparesis 03/02/19   Angiulli, Mcarthur Rossetti, PA-C  traMADol (ULTRAM) 50 MG tablet Take 50 mg by mouth every 6 (six) hours as needed (pain).    [provider]    Family History Family History  Problem Relation Age of Onset  . Hypertension Mother   . Hypertension Father     Social History Social History   Tobacco Use  . Smoking status: Current Every Day Smoker  . Smokeless tobacco: Never Used  Substance Use Topics  . Alcohol use: Never    Frequency: Never  . Drug use: Never     Allergies   Patient has no known allergies.   Review of Systems Review of Systems  Constitutional: Negative for chills and fever.  HENT: Negative for ear pain and sore throat.   Eyes: Negative for pain and visual disturbance.  Respiratory: Negative for cough and shortness of breath.   Cardiovascular: Negative for chest pain and palpitations.  Gastrointestinal: Negative for abdominal pain and vomiting.  Genitourinary: Negative for dysuria and hematuria.  Musculoskeletal: Positive for back pain. Negative for arthralgias.  Skin: Negative for color change and rash.  Neurological: Negative for seizures and syncope.  All other systems reviewed and are negative.    Physical Exam Updated Vital Signs BP (!) 146/86   Pulse 88   Resp 16   Ht  (1.575 m)   Wt 65.8 kg    SpO2 96%   BMI 26.52 kg/m   Physical Exam Vitals signs and nursing note reviewed.  Constitutional:      General: She is not in acute distress.    Appearance: She is not ill-appearing, toxic-appearing or diaphoretic.  HENT:     Head: Normocephalic and atraumatic.     Mouth/Throat:     Mouth: Mucous membranes are moist.     Pharynx: Oropharynx is clear.  Eyes:     Conjunctiva/sclera: Conjunctivae normal.  Cardiovascular:     Rate and Rhythm: Normal rate and regular rhythm.  Pulmonary:     Effort: Pulmonary effort is normal.     Breath sounds: Normal breath sounds.  Abdominal:  General: Abdomen is flat.     Palpations: Abdomen is soft.  Musculoskeletal:     Comments: No midline or bony tenderness of the spine  Neurological:     Mental Status: She is alert. Mental status is at baseline.     Comments: Mild slurred speech and contraction in the left arm.  Patient states that this is her baseline from previous strokes  Psychiatric:        Mood and Affect: Mood normal.        Behavior: Behavior normal.      ED Treatments / Results  Labs (all labs ordered are listed, but only abnormal results are displayed) Labs Reviewed - No data to display  EKG None  Radiology No results found.  Procedures Procedures (including critical care time)  Medications Ordered in ED Medications  HYDROcodone-acetaminophen (NORCO/VICODIN) 5-325 MG per tablet 1 tablet (1 tablet Oral Given 04/11/19 2148)     Initial Impression / Assessment and Plan / ED Course  I have reviewed the triage vital signs and the nursing notes.  Pertinent labs & imaging results that were available during my care of the patient were reviewed by me and considered in my medical decision making (see chart for details).  Clinical Course as of Apr 10 2222  Sat Apr 11, 2019  [id number] I called and left message for our social workers for consult.  Also put in a consult for case manager and social work   [KM]  2117 I spoke  with [facility].  They report that patient completed rehab today and was discharged from rehab to go home with her boyfriend Sharla KidneyBenjamin Evans.  I spoke with Sharla KidneyBenjamin Evans.  He reports that when they got the patient home she was unable to ambulate.  He reports that she required a wheelchair and his home is not wheelchair accessible.  Reports that she used to walk with a cane but no longer can do so.  Therefore he sent her back to the emergency department.  [facility] said that since she was discharged from the acute rehab, she would need to have paperwork filed and be accepted to a long term part of the facility.  I have attempted to contact social work in regards to this but have been unsuccessful so far.  Patient continues to have no complaints in the emergency department other than her chronic back pain.  We will continue to monitor patient at this time. Will await social work consult in the morning   [KM]    Clinical Course User Index [KM] Arlyn DunningMcLean, Alizah Sills A, PA-C       [facility]. Waiting on placement at this time  Final Clinical Impressions(s) / ED Diagnoses   Final diagnoses:  Inability to perform activities of daily living    ED Discharge Orders    None       Jeral PinchMcLean, Tito Ausmus A, PA-C 04/11/19 2223    Melene PlanFloyd, Dan, DO 04/11/19 2313

## 2019-04-11 NOTE — Progress Notes (Signed)
CSW spoke with admissions at [facility]. Per admissions patient would be eligible to return for long-term care but will need a negative COVID-19 test and current temp. CSW informed PA.   Tenna Delaine, LCSW, LCAS-A Clinical Social Worker II [phone]

## 2019-04-11 NOTE — ED Triage Notes (Signed)
Pt returned from [facility] via EMS. Pt was d/c from facility by family. [facility] has no room for her.

## 2019-04-11 NOTE — ED Notes (Signed)
Almost total care pt; changed patient.

## 2019-04-11 NOTE — ED Notes (Signed)
[facility] called for transport to [facility]

## 2019-04-11 NOTE — ED Notes (Signed)
Lunch tray ordered 

## 2019-04-11 NOTE — ED Provider Notes (Signed)
[facility] EMERGENCY DEPARTMENT Provider Note   CSN: [id number] Arrival date & time: 04/11/19  1101    History   Chief Complaint No chief complaint on file.   HPI [name] is a 63 y.o. female.     HPI Patient presents to the emergency department with general weakness and inability to care for self.  The patient was discharged this morning from [facility] health care.  She lives with a boyfriend and he was unable to help take care of her and get her into the house.  Patient is not able to help care for herself.  Patient states she is having no other symptoms or issues at this time. Past Medical History:  Diagnosis Date  . Blood transfusion without reported diagnosis   . CHF (congestive heart failure) (HCC)   . Hypertension   . Stroke [facility])     Patient Active Problem List   Diagnosis Date Noted  . Seizures (HCC)   . FUO (fever of unknown origin)   . Hyponatremia   . Spastic hemiparesis (HCC)   . Vascular headache   . Hypertensive crisis   . Thrombocytopenia (HCC)   . Leukopenia   . Hypoalbuminemia due to protein-calorie malnutrition (HCC)   . Hypokalemia   . Prediabetes   . Spastic hemiplegia affecting nondominant side (HCC)   . Chronic diastolic congestive heart failure (HCC)   . Substance abuse (HCC)   . Essential hypertension   . Labile blood pressure   . Aneurysm, cerebral, nonruptured 02/06/2019  . Acute right MCA stroke (HCC) 02/06/2019  . 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 Right carotid artery occlusion 02/05/2019  . Left hemiparesis (HCC) 02/05/2019  . CVA (cerebral vascular accident) (HCC) 02/03/2019    Past Surgical History:  Procedure Laterality Date  . ABDOMINAL SURGERY       OB History   No obstetric history on file.      Home Medications    Prior to Admission medications   Medication Sig Start Date End Date Taking? Authorizing Provider  acetaminophen (TYLENOL) 325 MG tablet Take 2 tablets (650 mg total)  by mouth every 4 (four) hours as needed for mild pain (or temp > 37.5 C (99.5 F)). 03/02/19  Yes Angiulli, Mcarthur Rossetti, PA-C  amLODipine (NORVASC) 2.5 MG tablet Take 3 tablets (7.5 mg total) by mouth daily. 03/02/19  Yes Angiulli, Mcarthur Rossetti, PA-C  aspirin 325 MG tablet Take 1 tablet (325 mg total) by mouth daily. 02/07/19  Yes Arnetha Courser, MD  atorvastatin (LIPITOR) 40 MG tablet Take 1 tablet (40 mg total) by mouth daily at 6 PM. Patient taking differently: Take 40 mg by mouth at bedtime.  02/06/19  Yes Rehman, Areeg N, DO  levETIRAcetam (KEPPRA) 750 MG tablet Take 1 tablet (750 mg total) by mouth 2 (two) times daily. 03/02/19  Yes Angiulli, Mcarthur Rossetti, PA-C  nicotine (NICODERM CQ - DOSED IN MG/24 HOURS) 14 mg/24hr patch Place 14 mg onto the skin daily.   Yes [provider]  pantoprazole (PROTONIX) 40 MG tablet Take 40 mg by mouth daily. 12/23/18  Yes [provider]  pregabalin (LYRICA) 75 MG capsule Take 1 capsule (75 mg total) by mouth daily. Patient taking differently: Take 75 mg by mouth every 12 (twelve) hours.  03/02/19  Yes Angiulli, Mcarthur Rossetti, PA-C  risperiDONE (RISPERDAL) 0.5 MG tablet Take 0.5 mg by mouth 2 (two) times daily.   Yes [provider]  senna-docusate (SENNA S) 8.6-50 MG tablet  Take 1 tablet by mouth 2 (two) times daily.   Yes [provider]  sertraline (ZOLOFT) 25 MG tablet Take 25 mg by mouth daily.   Yes [provider]  tiZANidine (ZANAFLEX) 4 MG tablet Take 2 tablets (8 mg total) by mouth 2 (two) times daily. Patient taking differently: Take 8 mg by mouth 3 (three) times daily.  03/02/19  Yes Angiulli, Mcarthur Rossettianiel J, PA-C  traMADol (ULTRAM) 50 MG tablet Take 50 mg by mouth every 6 (six) hours as needed (pain).   Yes [provider]  risperiDONE (RISPERDAL) 1 MG tablet Take 1 tablet (1 mg total) by mouth at bedtime. Patient not taking: Reported on 04/11/2019 03/02/19   Angiulli, Mcarthur Rossettianiel J, PA-C    Family History Family History   Problem Relation Age of Onset  . Hypertension Mother   . Hypertension Father     Social History Social History   Tobacco Use  . Smoking status: Current Every Day Smoker  . Smokeless tobacco: Never Used  Substance Use Topics  . Alcohol use: Never    Frequency: Never  . Drug use: Never     Allergies   Patient has no known allergies.   Review of Systems Review of Systems  All other systems negative except as documented in the HPI. All pertinent positives and negatives as reviewed in the HPI. Physical Exam Updated Vital Signs BP (!) 159/93   Pulse 79   Temp 97.7 F (36.5 C) (Oral)   Resp 16   SpO2 97%   Physical Exam Vitals signs and nursing note reviewed.  Constitutional:      General: She is not in acute distress.    Appearance: She is well-developed.  HENT:     Head: Normocephalic and atraumatic.  Eyes:     Pupils: Pupils are equal, round, and reactive to light.  Neck:     Musculoskeletal: Normal range of motion and neck supple.  Cardiovascular:     Rate and Rhythm: Normal rate and regular rhythm.     Heart sounds: Normal heart sounds. No murmur. No friction rub. No gallop.   Pulmonary:     Effort: Pulmonary effort is normal. No respiratory distress.     Breath sounds: Normal breath sounds. No wheezing.  Skin:    General: Skin is warm and dry.     Capillary Refill: Capillary refill takes less than 2 seconds.     Findings: No erythema or rash.  Neurological:     Mental Status: She is alert and oriented to person, place, and time.     Motor: No abnormal muscle tone.     Coordination: Coordination normal.  Psychiatric:        Behavior: Behavior normal.      ED Treatments / Results  Labs (all labs ordered are listed, but only abnormal results are displayed) Labs Reviewed  SARS CORONAVIRUS 2 (HOSPITAL ORDER, [facility]FOR[facility])    EKG None  Radiology No results found.  Procedures Procedures (including critical care time)   Medications Ordered in ED Medications - No data to display   Initial Impression / Assessment and Plan / ED Course  I have reviewed the triage vital signs and the nursing notes.  Pertinent labs & imaging results that were available during my care of the patient were reviewed by me and considered in my medical decision making (see chart for details).        [facility]  as this was coordinated by the Child psychotherapist.  The patient has been stable here in the emergency department and poses no other complaints at this time.  They did request a COVID-19 test for returning.  Final Clinical Impressions(s) / ED Diagnoses   Final diagnoses:  None    ED Discharge Orders    None       Charlestine Night, PA-C 04/11/19 1526    Arby Barrette, MD 04/20/19 1427

## 2019-04-11 NOTE — Progress Notes (Signed)
CSW left follow-up voicemail with [name] from [employer] informing of results of negative COVID-19 screen and temperature at 4PM. CSW has not received a call back or confirmation of a room number with [employer].  Tenna Delaine, LCSW, LCAS-A Clinical Social Worker II [phone]

## 2019-04-12 NOTE — ED Notes (Signed)
Changed pt brief and performed peri care. Turned pt but pt rolled back over to left side. Educated pt on turning to prevent pressure injury. Pt verbalize understanding and agrees to lay on back

## 2019-04-12 NOTE — ED Notes (Signed)
Social Worker called Jasmine December, reg. And stated, she will go back to [facility] but it will not be until tom when the bed is ready.

## 2019-04-12 NOTE — Progress Notes (Signed)
Patient will have bed available Monday at [facility].   Tenna Delaine, LCSW, LCAS-A Clinical Social Worker II [phone]

## 2019-04-12 NOTE — ED Notes (Signed)
Heart healthy lunch tray ordered for pt

## 2019-04-12 NOTE — ED Notes (Signed)
Given coffee

## 2019-04-12 NOTE — ED Notes (Signed)
Pt. Has breakfast tray, encouraged to eat and she stated, I will in just a few minutes.

## 2019-04-12 NOTE — Progress Notes (Addendum)
8:15AM: CSW spoke with patient's significant other and noted that they have had discussions with [employer] for long-term acute care coverage under patient's Medicaid.  CSW aware of consult. CSW noted yesterday that confirmation of bed has not been received per most recent social work note. CSW still awaiting for confirmation of having an available long-term care bed for the patient prior to sending patient over.   Tenna Delaine, LCSW, LCAS-A Clinical Social Worker II [phone]

## 2019-04-12 NOTE — ED Notes (Signed)
Pt placed in hospital bed, pt linens and brief changed. Purewick placed and pt placed in position of comfort.

## 2019-04-12 NOTE — ED Notes (Signed)
Spoke with representative from [employer] to verify pt bed status tomorrow.   State it would be best to call back tomorrow morning after 1000 to receive pt bed status.   Call [phone] and ask to speak to admissions.

## 2019-04-12 NOTE — ED Notes (Signed)
Pt sitting up in bed eating lunch

## 2019-04-13 ENCOUNTER — Encounter: Payer: Medicare Other | Attending: Physical Medicine & Rehabilitation | Admitting: Physical Medicine & Rehabilitation

## 2019-04-13 DIAGNOSIS — R531 Weakness: Secondary | ICD-10-CM | POA: Diagnosis not present

## 2019-04-13 NOTE — Progress Notes (Signed)
CSW called and spoke to the pt who was agreeable to paying the required $918 fee needed for the pt to remain at [facility] for LTC.  CSW updated [name] in admissions who will call the CSW back with the accepting information  CSW will continue to follow for D/C needs.  Dorothe Pea. Trinidee Schrag, LCSW, LCAS, CSI Transitions of Care Clinical Social Worker Care Coordination Department Ph: [phone]

## 2019-04-13 NOTE — Evaluation (Signed)
Physical Therapy Evaluation Patient Details Name: [name] MRN: [id number] DOB: July 16, 1956 Today's Date: 04/13/2019   History of Present Illness   [name] is a 63 yo female with a HTN, hx of PE, schizophrenia, bipolar disorder prior stroke with residual left arm paralysis and left leg weakness, and cocaine use presenting with stroke symptoms, HA and falls.  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 [facility] she was recieving rehab. Per nursing her daughter discharged her home then relized she could not take care of her so she dropped her at the ED. Per patient she could not get up the stairs.   Clinical Impression  Patient required significant assist for all mobility. She required amax a for stand pivot transfer. She required max a to the edge of the bed.The patient has steps into her house and will require total assist for all ADL's. She would benefit most from rehab at a SNF.     Follow Up Recommendations SNF    Equipment Recommendations       Recommendations for Other Services Rehab consult     Precautions / Restrictions Precautions Precautions: Fall Precaution Comments: No fucntional use of the left side  Restrictions Weight Bearing Restrictions: No      Mobility  Bed Mobility Overal bed mobility: Needs Assistance Bed Mobility: Sit to Supine       Sit to supine: Max assist   General bed mobility comments: Max a to sit up. Max a to scoot to the edge. Able to assist using the right UE. min gaurd to Mod a to remain sitting at the edge of the bed   Transfers Overall transfer level: Needs assistance   Transfers: Sit to/from Stand;Stand Pivot Transfers Sit to Stand: Max assist Stand pivot transfers: Max assist       General transfer comment: Max a to stand. Unable to maintain standing without max a. No wheelchair so she  was not transfered to the wheelchair.   Ambulation/Gait             General Gait Details: non Geneticist, molecularambualtory   Stairs            Wheelchair Mobility    Modified Rankin (Stroke Patients Only)       Balance Overall balance assessment: Needs assistance Sitting-balance support: Single extremity supported Sitting balance-Leahy Scale: Poor Sitting balance - Comments: Min a to min gaurd to remain sitting                                      Pertinent Vitals/Pain      Home Living Family/patient expects to be discharged to:: Skilled nursing facility Living Arrangements: Spouse/significant other;Non-relatives/Friends Available Help at Discharge: Family;Friend(s);Personal care attendant;Available 24 hours/day Type of Home: House Home Access: Stairs to enter Entrance Stairs-Rails: Right;Left;Can reach both Entrance Stairs-Number of Steps: 15(per patient ) Home Layout: Two level;Able to live on main level with bedroom/bathroom Home Equipment: Gilmer MorCane - single point;Shower seat      Prior Function Level of Independence: Needs assistance   Gait / Transfers Assistance Needed: Per patient she transfered to the wheelchair with cane   ADL's / Homemaking Assistance Needed: Per patient family unable to complete level of need required         Hand Dominance  Extremity/Trunk Assessment   Upper Extremity Assessment Upper Extremity Assessment: Defer to OT evaluation    Lower Extremity Assessment Lower Extremity Assessment: LLE deficits/detail LLE Deficits / Details: No active movement of the left side        Communication   Communication: No difficulties  Cognition Arousal/Alertness: Awake/alert Behavior During Therapy: Flat affect Overall Cognitive Status: Impaired/Different from baseline Area of Impairment: Safety/judgement;Awareness;Problem solving;Following commands                       Following Commands: Follows multi-step commands  inconsistently Safety/Judgement: Decreased awareness of safety Awareness: Intellectual Problem Solving: Slow processing General Comments: Patient kept asking to get to her wheelchair. Her wheelchair was not present. he did follow all commands.       General Comments      Exercises     Assessment/Plan    PT Assessment Patient needs continued PT services  PT Problem List Decreased strength;Decreased range of motion;Decreased activity tolerance;Decreased mobility;Decreased knowledge of use of DME       PT Treatment Interventions DME instruction;Gait training;Functional mobility training;Therapeutic activities;Therapeutic exercise;Patient/family education    PT Goals (Current goals can be found in the Care Plan section)  Acute Rehab PT Goals Patient Stated Goal: none stated  Time For Goal Achievement: 04/20/19 Potential to Achieve Goals: Good    Frequency Min 2X/week   Barriers to discharge Decreased caregiver support could not get into her home     Co-evaluation               AM-PAC PT "6 Clicks" Mobility  Outcome Measure Help needed turning from your back to your side while in a flat bed without using bedrails?: A Lot Help needed moving from lying on your back to sitting on the side of a flat bed without using bedrails?: A Lot Help needed moving to and from a bed to a chair (including a wheelchair)?: Total Help needed standing up from a chair using your arms (e.g., wheelchair or bedside chair)?: Total Help needed to walk in hospital room?: Total Help needed climbing 3-5 steps with a railing? : Total 6 Click Score: 8    End of Session Equipment Utilized During Treatment: Gait belt Activity Tolerance: Patient limited by fatigue Patient left: in bed;with call bell/phone within reach;with nursing/sitter in room Nurse Communication: Mobility status PT Visit Diagnosis: Muscle weakness (generalized) (M62.81);Other abnormalities of gait and mobility (R26.89)    Time:  9753-0051 PT Time Calculation (min) (ACUTE ONLY): 20 min   Charges:   PT Evaluation $PT Eval Moderate Complexity: 1 Mod           Dessie Coma PT DPT  04/13/2019, 1:14 PM

## 2019-04-13 NOTE — ED Notes (Signed)
Pt has been changed due to incontinent and new purewick has been put on and hooked up to suction. [place] sandwich and drink were given to pt as well.

## 2019-04-13 NOTE — Evaluation (Signed)
Occupational Therapy Evaluation Patient Details Name: [name] MRN: [id number] DOB: Feb 27, 1956 Today's Date: 04/13/2019    History of Present Illness  [name] is a 63 yo female with a HTN, hx of PE, schizophrenia, bipolar disorder prior stroke with residual left arm paralysis and left leg weakness, and cocaine use presenting with stroke symptoms, HA and falls.  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 She had discharged to [facility] where she was recieving rehab. Per nursing her daughter discharged her home then realized she could not take care of her so she dropped her at the ED. Per patient she could not get up the stairs.    Clinical Impression   Pt came from SNF. TOday Pt presents with no active movement of LUE, tone and contracture present (needs a resting hand splint for at night) OT was able to break tone at the hand, wrist, elbow (not completely) and limited shoulder for PROM. Pt is right handed, but overall total to max A for LB ADL, min guard to max A for UB ADL. +2 for transfers (did not attempt this session) max A for bed mobility. Pt will benefit from skilled OT by returning to SNF level of care. I would also recommend a Palliative Consult for this patient and her family to discuss true goals for quality of life.    Follow Up Recommendations  SNF;Supervision/Assistance - 24 hour    Equipment Recommendations  Other (comment)(resting hand splint for RUE)    Recommendations for Other Services  Palliative Medicine Consulatation     Precautions / Restrictions Precautions Precautions: Fall Precaution Comments: No functional use of the left side  Restrictions Weight Bearing Restrictions: No      Mobility Bed Mobility Overal bed mobility: Needs Assistance Bed Mobility: Sit to Supine       Sit to supine: Max assist   General bed mobility comments: Max  a to sit up. Max a to scoot to the edge. Able to assist using the right UE. min gaurd to Mod a to remain sitting at the edge of the bed   Transfers Overall transfer level: Needs assistance   Transfers: Sit to/from Stand;Stand Pivot Transfers Sit to Stand: Max assist Stand pivot transfers: Max assist       General transfer comment: NT this session    Balance Overall balance assessment: Needs assistance Sitting-balance support: Single extremity supported Sitting balance-Leahy Scale: Poor Sitting balance - Comments: Mod A to min gaurd to remain sitting                                    ADL either performed or assessed with clinical judgement   ADL Overall ADL's : Needs assistance/impaired Eating/Feeding: Maximal assistance   Grooming: Moderate assistance   Upper Body Bathing: Maximal assistance   Lower Body Bathing: Maximal assistance   Upper Body Dressing : Total assistance   Lower Body Dressing: Total assistance   Toilet Transfer: Maximal assistance;+2 for physical assistance;+2 for safety/equipment   Toileting- Clothing Manipulation and Hygiene: Total assistance       Functional mobility during ADLs: Maximal assistance;+2 for physical assistance;+2 for safety/equipment       Vision         Perception     Praxis      Pertinent Vitals/Pain Pain Assessment: No/denies pain  Hand Dominance Right   Extremity/Trunk Assessment Upper Extremity Assessment Upper Extremity Assessment: LUE deficits/detail LUE Deficits / Details: no active movement. tone and contracture forming in LUE. Able to break tone in hand, wrist, and elbow and perform PROM. LUE Coordination: decreased fine motor;decreased gross motor   Lower Extremity Assessment Lower Extremity Assessment: Defer to PT evaluation LLE Deficits / Details: No active movement of the left side        Communication Communication Communication: No difficulties   Cognition  Arousal/Alertness: Awake/alert Behavior During Therapy: Flat affect Overall Cognitive Status: Impaired/Different from baseline Area of Impairment: Safety/judgement;Awareness;Problem solving;Following commands                       Following Commands: Follows multi-step commands inconsistently Safety/Judgement: Decreased awareness of safety Awareness: Intellectual Problem Solving: Slow processing General Comments: Patient kept asking to get to her wheelchair. Her wheelchair was not present. he did follow all commands.    General Comments       Exercises     Shoulder Instructions      Home Living Family/patient expects to be discharged to:: Skilled nursing facility Living Arrangements: Spouse/significant other;Non-relatives/Friends Available Help at Discharge: Family;Friend(s);Personal care attendant;Available 24 hours/day Type of Home: House Home Access: Stairs to enter [employer] of Steps: 15(per patient ) Entrance Stairs-Rails: Right;Left;Can reach both Home Layout: Two level;Able to live on main level with bedroom/bathroom           Bathroom Accessibility: Yes   Home Equipment: Cane - single point;Shower seat          Prior Functioning/Environment Level of Independence: Needs assistance  Gait / Transfers Assistance Needed: Per patient she transfered to the wheelchair with cane  ADL's / Homemaking Assistance Needed: Per patient family unable to complete level of need required             OT Problem List: Impaired balance (sitting and/or standing);Decreased activity tolerance;Decreased range of motion;Decreased strength;Decreased cognition;Decreased safety awareness;Decreased knowledge of use of DME or AE;Impaired tone;Impaired UE functional use      OT Treatment/Interventions:      OT Goals(Current goals can be found in the care plan section) Acute Rehab OT Goals Patient Stated Goal: get back to SNF OT Goal Formulation: With patient Time  For Goal Achievement: 04/27/19 Potential to Achieve Goals: Fair  OT Frequency:     Barriers to D/C: Decreased caregiver support  family cannot help her access her home       Co-evaluation              AM-PAC OT "6 Clicks" Daily Activity     Outcome Measure Help from another person eating meals?: A Lot Help from another person taking care of personal grooming?: A Lot Help from another person toileting, which includes using toliet, bedpan, or urinal?: A Lot Help from another person bathing (including washing, rinsing, drying)?: A Lot Help from another person to put on and taking off regular upper body clothing?: A Lot Help from another person to put on and taking off regular lower body clothing?: Total 6 Click Score: 11   End of Session Nurse Communication: Mobility status  Activity Tolerance: Patient tolerated treatment well Patient left: in bed;with call bell/phone within reach  OT Visit Diagnosis: Unsteadiness on feet (R26.81);Other abnormalities of gait and mobility (R26.89);Muscle weakness (generalized) (M62.81);Other symptoms and signs involving the nervous system (R29.898);Hemiplegia and hemiparesis Hemiplegia - Right/Left: Left Hemiplegia - dominant/non-dominant: Non-Dominant Hemiplegia - caused by: Cerebral infarction  Time: 1425-1450 OT Time Calculation (min): 25 min Charges:  OT General Charges $OT Visit: 1 Visit OT Evaluation $OT Eval Moderate Complexity: 1 Mod  Sherryl MangesLaura Aleesia Henney OTR/L Acute Rehabilitation Services Pager: [phone] Office: [phone]  Evern BioLaura J Hertha Gergen 04/13/2019, 3:39 PM

## 2019-04-13 NOTE — Progress Notes (Addendum)
2nd shift ED CSW received a handoff from the 1st shift [facility] ED CSW.   Pt is awaiting an auth from [employer] which is being obtained by [name] of [employer].  [name] will call when Berkley Harvey is received.  CSW awaiting return call from [place] with pt's auth #.  4:27 PM CSW spoke to [place] at [facility] who states pt's Berkley Harvey is not in yet.  However, the plan originally was for the pt to go in for a shprt-term skilled stay using her [facility] Medicare and then transition the pt into LTC using her Medicaid and her social security care combined.  Per [name], either way the pt is admitted the pt will need to pay a $918 fee to remain at the facility for LTC.  Per [name], the pt should know that the facility is willing and would appreciate the pt returning.  CSW will call the pt and updated her by phone as the CSW is at the [facility] ED.  CSW will continue to follow for D/C needs.  Dorothe Pea. Laneice Meneely, LCSW, LCAS, CSI Transitions of Care Clinical Social Worker Care Coordination Department Ph: [phone]

## 2019-04-13 NOTE — Progress Notes (Addendum)
CSW received a call from [name] in admission at [facility] SNF stating the patient has been offered a bed and has been accepted and that the pt can arrive on 04/13/2019.  The pt's accepting doctor is the SNF MD.  The room number will be 121-A.  The number for report is [phone].  RN please send pt's COVID-19 Negative Test Results with the pt's paperwork, thank you.  CSW updated RN/EDP.  Dorothe Pea. Vanesa Renier, Theresia Majors, LCAS Clinical Social Worker Ph: [phone]

## 2019-04-13 NOTE — Progress Notes (Signed)
CSW received a call from [place] at [facility] stating they need the AVS and COVID results faxed to them at:  Fax: [phone].  RN updated.  CSW will continue to follow for D/C needs.  Dorothe Pea. Milica Gully, LCSW, LCAS, CSI Transitions of Care Clinical Social Worker Care Coordination Department Ph: [phone]

## 2019-04-13 NOTE — NC FL2 (Signed)
[place] MEDICAID FL2 LEVEL OF CARE SCREENING TOOL     IDENTIFICATION  Patient Name: [name] Birthdate: 1956-05-04 Sex: female Admission Date (Current Location): 04/11/2019  [place]  [place] [id number] Facility and Address:  [facility], [address], [place], [place] [address]      Provider Number: [id number]  Attending Physician Name and Address:  Default, Provider, MD  Relative Name and Phone Number:  Sharla KidneyBenjamin Evans [phone]    Current Level of Care: Hospital Recommended Level of Care: Skilled Nursing Facility Prior Approval Number:    Date Approved/Denied:   PASRR Number: [id number]  Discharge Plan: SNF    Current Diagnoses: Patient Active Problem List   Diagnosis Date Noted  . Seizures (HCC)   . FUO (fever of unknown origin)   . Hyponatremia   . Spastic hemiparesis (HCC)   . Vascular headache   . Hypertensive crisis   . Thrombocytopenia (HCC)   . Leukopenia   . Hypoalbuminemia due to protein-calorie malnutrition (HCC)   . Hypokalemia   . Prediabetes   . Spastic hemiplegia affecting nondominant side (HCC)   . Chronic diastolic congestive heart failure (HCC)   . Substance abuse (HCC)   . Essential hypertension   . Labile blood pressure   . Aneurysm, cerebral, nonruptured 02/06/2019  . Acute right MCA stroke (HCC) 02/06/2019  . 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 Right carotid artery occlusion 02/05/2019  . Left hemiparesis (HCC) 02/05/2019  . CVA (cerebral vascular accident) (HCC) 02/03/2019    Orientation RESPIRATION BLADDER Height & Weight     Self, Time, Situation, Place  Normal Incontinent Weight: 145 lb (65.8 kg) Height:  5\' 2"  (157.5 cm)  BEHAVIORAL SYMPTOMS/MOOD NEUROLOGICAL BOWEL NUTRITION STATUS      Incontinent Diet  AMBULATORY STATUS COMMUNICATION OF NEEDS Skin   Total Care Verbally Normal                       Personal Care Assistance Level of Assistance   Dressing, Feeding, Bathing Bathing Assistance: Maximum assistance Feeding assistance: Limited assistance Dressing Assistance: Maximum assistance Total Care Assistance: Maximum assistance   Functional Limitations Info  Speech, Hearing, Sight Sight Info: Adequate Hearing Info: Adequate Speech Info: Adequate    SPECIAL CARE FACTORS FREQUENCY  OT (By licensed OT), PT (By licensed PT)     PT Frequency: 5x weekly OT Frequency: 5x weekly            Contractures Contractures Info: Not present    Additional Factors Info  Code Status Code Status Info: Full             Current Medications (04/13/2019):  This is the current hospital active medication list No current facility-administered medications for this encounter.    Current Outpatient Medications  Medication Sig Dispense Refill  . acetaminophen (TYLENOL) 325 MG tablet Take 2 tablets (650 mg total) by mouth every 4 (four) hours as needed for mild pain (or temp > 37.5 C (99.5 F)).    Marland Kitchen. amLODipine (NORVASC) 2.5 MG tablet Take 3 tablets (7.5 mg total) by mouth daily.    Marland Kitchen. aspirin 325 MG tablet Take 1 tablet (325 mg total) by mouth daily.    Marland Kitchen. atorvastatin (LIPITOR) 40 MG tablet Take 1 tablet (40 mg total) by mouth daily at 6 PM. (Patient taking differently: Take 40 mg by mouth at bedtime. )    . levETIRAcetam (KEPPRA) 750 MG tablet Take 1 tablet (750  mg total) by mouth 2 (two) times daily.    . nicotine (NICODERM CQ - DOSED IN MG/24 HOURS) 14 mg/24hr patch Place 14 mg onto the skin daily.    . pantoprazole (PROTONIX) 40 MG tablet Take 40 mg by mouth daily.    . pregabalin (LYRICA) 75 MG capsule Take 1 capsule (75 mg total) by mouth daily. (Patient taking differently: Take 75 mg by mouth every 12 (twelve) hours. ) 3 capsule 0  . risperiDONE (RISPERDAL) 0.5 MG tablet Take 0.5 mg by mouth 2 (two) times daily. 8am, 4pm    . risperiDONE (RISPERDAL) 1 MG tablet Take 1 tablet (1 mg total) by mouth at bedtime. (Patient not taking:  Reported on 04/11/2019) 3 tablet 0  . senna-docusate (SENNA S) 8.6-50 MG tablet Take 1 tablet by mouth 2 (two) times daily.    . sertraline (ZOLOFT) 25 MG tablet Take 25 mg by mouth daily.    Marland Kitchen tiZANidine (ZANAFLEX) 4 MG tablet Take 2 tablets (8 mg total) by mouth 2 (two) times daily. (Patient taking differently: Take 8 mg by mouth 3 (three) times daily. For spastic hemiparesis) 6 tablet 0  . traMADol (ULTRAM) 50 MG tablet Take 50 mg by mouth every 6 (six) hours as needed (pain).       Discharge Medications: Please see discharge summary for a list of discharge medications.  Relevant Imaging Results:  Relevant Lab Results:   Additional Information SSN: [id number]  Inis Sizer, LCSW

## 2019-04-13 NOTE — ED Notes (Signed)
Breakfast ordered 

## 2019-04-13 NOTE — Progress Notes (Addendum)
11:30am: CSW spoke with [name] at [facility] who stated there is bed availability today for this patient. [name] requested patient's temperatures be documented. [name] stated she will reach out to [employer] for authorization to readmit the patient to [facility].  CSW asked [name], RN to obtain patient's temperature and document it, RN agreeable.  11am: CSW spoke with RN [name] to discuss patient. CSW noticed that patient had not been seen by PT/OT all weekend and will need those recommendations prior to being sent to [facility]. RN agreeable to placing orders for patient.  CSW placed call to [place] at [facility] to determine if there was bed availability, no answer so voicemail was left requesting a return call.  Edwin Dada, MSW, LCSW-A Clinical Social Worker [facility] [phone]

## 2019-04-13 NOTE — ED Notes (Signed)
[facility] called for transport.  

## 2019-04-13 NOTE — ED Notes (Signed)
Discharge paperwork and covid test results faxed to [facility]

## 2019-04-20 ENCOUNTER — Telehealth: Payer: Self-pay

## 2019-04-20 NOTE — Telephone Encounter (Signed)
I called [facility] at [phone].I stated pt appt on Wednesday at 315pm will be change to video due to COVID 19. I was transfer to the scheduler and put on hold. I was on hold and no one never pick up the line. Will call back this afternoon.

## 2019-04-21 NOTE — Telephone Encounter (Signed)
I called [facility] and spoke with Tamika that visit will be video due to COVID 19. I stated we need an email to send link to. I was given email of [email or web address].

## 2019-04-21 NOTE — Telephone Encounter (Signed)
Link sent to email [email or web address] for pts nurse Tamika.

## 2019-04-22 ENCOUNTER — Other Ambulatory Visit: Payer: Self-pay

## 2019-04-22 ENCOUNTER — Inpatient Hospital Stay (INDEPENDENT_AMBULATORY_CARE_PROVIDER_SITE_OTHER): Payer: Medicare Other | Admitting: Adult Health

## 2019-04-22 DIAGNOSIS — Z0289 Encounter for other administrative examinations: Secondary | ICD-10-CM

## 2019-04-22 NOTE — Progress Notes (Unsigned)
[facility] [address] [place]. [place] [id number] [phone]       VIRTUAL VISIT FOLLOW UP NOTE  [name] Date of Birth:  [birth year] Medical Record Number:  [id number]   Reason for Referral:  hospital stroke follow up    Virtual Visit via Video Note  I connected with [name] on 04/22/19 at  3:15 PM EDT by a video enabled telemedicine application located remotely in my own home and verified that I am speaking with the correct person using two identifiers who was located at their own home.   Visit scheduled by ***. She discussed the limitations of evaluation and management by telemedicine and the availability of in person appointments. The patient expressed understanding and agreed to proceed.Please see telephone note for additional scheduling information and consent.    CHIEF COMPLAINT:  No chief complaint on file.   HPI: [name] was initially scheduled today for in office hospital follow-up regarding *** but due to COVID-19 safety precautions, visit transition to telemedicine via *** with patients consent. History obtained from *** and chart review. Reviewed all radiology images and labs personally.   [name] is a 63 y.o. AA female with history of previous R MCA stroke, HTN, bipolar d/o schizophrenia, tobacco abuse, alcoholism, CHF, history of PE, blood transfusions and polysubstance abuse  who presented with worsening L side weakness and frequent falls.  Stroke work-up revealed multiple right brain infarcts adjacent to previous right MCA infarct in setting of right ICA acute on chronic occlusion and current cocaine use likely secondary to large vessel source on 02/03/2019. Mri showed acute R MCA infarct adjacent to previous infarcts.   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  2D echo unremarkable without cardiac source of embolus  identified.  LDL 92 and A1c 5.8.  UDS positive for cocaine.  Previously on Plavix and recommended discontinuing and initiate aspirin 325 mg daily given enlarging left ICA aneurysm.  Recommend follow-up with Dr. Corliss Skainsdeveshwar for potential intervention of cerebral aneurysm if appropriate.  HTN stable.  Increase atorvastatin dose for HLD management.  Discussion regarding cocaine cessation and tobacco cessation.  Other stroke risk factors include history of prior history of stroke, alcohol abuse, history of CHF and history of PE.  Discharged home in stable condition with recommendations of outpatient speech therapy.    ROS:   14 system review of systems performed and negative with exception of ***  PMH:  Past Medical History:  Diagnosis Date   Blood transfusion without reported diagnosis    CHF (congestive heart failure) (HCC)    Hypertension    Stroke (HCC)     PSH:  Past Surgical History:  Procedure Laterality Date   ABDOMINAL SURGERY      Social History:  Social History   Socioeconomic History   Marital status: Single    Spouse name: Not on file   Number of children: Not on file   Years of education: Not on file   Highest education level: Not on file  Occupational History   Not on file  Social Needs   Financial resource strain: Not on file   Food insecurity:    Worry: Not on file    Inability: Not on file   Transportation needs:    Medical: Not on file    Non-medical: Not on file  Tobacco Use   Smoking status: Current  Every Day Smoker   Smokeless tobacco: Never Used  Substance and Sexual Activity   Alcohol use: Never    Frequency: Never   Drug use: Never   Sexual activity: Not on file  Lifestyle   Physical activity:    Days per week: Not on file    Minutes per session: Not on file   Stress: Not on file  Relationships   Social connections:    Talks on phone: Not on file    Gets together: Not on file    Attends religious service: Not on file     Active member of club or organization: Not on file    Attends meetings of clubs or organizations: Not on file    Relationship status: Not on file   Intimate partner violence:    Fear of current or ex partner: Not on file    Emotionally abused: Not on file    Physically abused: Not on file    Forced sexual activity: Not on file  Other Topics Concern   Not on file  Social History Narrative   Not on file    Family History:  Family History  Problem Relation Age of Onset   Hypertension Mother    Hypertension Father     Medications:   Current Outpatient Medications on File Prior to Visit  Medication Sig Dispense Refill   acetaminophen (TYLENOL) 325 MG tablet Take 2 tablets (650 mg total) by mouth every 4 (four) hours as needed for mild pain (or temp > 37.5 C (99.5 F)).     amLODipine (NORVASC) 2.5 MG tablet Take 3 tablets (7.5 mg total) by mouth daily.     aspirin 325 MG tablet Take 1 tablet (325 mg total) by mouth daily.     atorvastatin (LIPITOR) 40 MG tablet Take 1 tablet (40 mg total) by mouth daily at 6 PM. (Patient taking differently: Take 40 mg by mouth at bedtime. )     levETIRAcetam (KEPPRA) 750 MG tablet Take 1 tablet (750 mg total) by mouth 2 (two) times daily.     nicotine (NICODERM CQ - DOSED IN MG/24 HOURS) 14 mg/24hr patch Place 14 mg onto the skin daily.     pantoprazole (PROTONIX) 40 MG tablet Take 40 mg by mouth daily.     pregabalin (LYRICA) 75 MG capsule Take 1 capsule (75 mg total) by mouth daily. (Patient taking differently: Take 75 mg by mouth every 12 (twelve) hours. ) 3 capsule 0   risperiDONE (RISPERDAL) 0.5 MG tablet Take 0.5 mg by mouth 2 (two) times daily. 8am, 4pm     risperiDONE (RISPERDAL) 1 MG tablet Take 1 tablet (1 mg total) by mouth at bedtime. (Patient not taking: Reported on 04/11/2019) 3 tablet 0   senna-docusate (SENNA S) 8.6-50 MG tablet Take 1 tablet by mouth 2 (two) times daily.     sertraline (ZOLOFT) 25 MG tablet Take 25 mg by  mouth daily.     tiZANidine (ZANAFLEX) 4 MG tablet Take 2 tablets (8 mg total) by mouth 2 (two) times daily. (Patient taking differently: Take 8 mg by mouth 3 (three) times daily. For spastic hemiparesis) 6 tablet 0   traMADol (ULTRAM) 50 MG tablet Take 50 mg by mouth every 6 (six) hours as needed (pain).     No current facility-administered medications on file prior to visit.     Allergies:  No Known Allergies   Physical Exam  There were no vitals filed for this visit. There is no height  or weight on file to calculate BMI. No exam data present  No flowsheet data found.   General: well developed, well nourished, seated, in no evident distress Head: head normocephalic and atraumatic.     Neurologic Exam Mental Status: Awake and fully alert. Oriented to place and time. Recent and remote memory intact. Attention span, concentration and fund of knowledge appropriate. Mood and affect appropriate.  Cranial Nerves: Fundoscopic exam reveals sharp disc margins. Pupils equal, briskly reactive to light. Extraocular movements full without nystagmus. Visual fields full to confrontation. Hearing intact. Facial sensation intact. Face, tongue, palate moves normally and symmetrically.  Motor: Normal bulk and tone. Normal strength in all tested extremity muscles. Sensory.: intact to touch , pinprick , position and vibratory sensation.  Coordination: Rapid alternating movements normal in all extremities. Finger-to-nose and heel-to-shin performed accurately bilaterally. Gait and Station: Arises from chair without difficulty. Stance is normal. Gait demonstrates normal stride length and balance. Able to heel, toe and tandem walk without difficulty.  Reflexes: 1+ and symmetric. Toes downgoing.    NIHSS  *** Modified Rankin  *** CHA2DS2-VASc *** HAS-BLED ***   Diagnostic Data (Labs, Imaging, Testing)  CT HEAD WO CONTRAST ***  CT ANGIO HEAD W OR WO CONTRAST CT ANGIO NECK W OR WO  CONTRAST ***  MR BRAIN WO CONTRAST ***  MR MRA HEAD  MR MRA NECK ***  ECHOCARDIOGRAM ***    ASSESSMENT: [name] is a 63 y.o. year old female here with *** on *** secondary to ***. Vascular risk factors include ***.     PLAN:  1. *** : Continue {anticoagulants:31417}  and ***  for secondary stroke prevention. Maintain strict control of hypertension with blood pressure goal below 130/90, diabetes with hemoglobin A1c goal below 6.5% and cholesterol with LDL cholesterol (bad cholesterol) goal below 70 mg/dL.  I also advised the patient to eat a healthy diet with plenty of whole grains, cereals, fruits and vegetables, exercise regularly with at least 30 minutes of continuous activity daily and maintain ideal body weight. 2. HTN: Advised to continue current treatment regimen.  Today's BP ***.  Advised to continue to monitor at home along with continued follow-up with PCP for management 3. HLD: Advised to continue current treatment regimen along with continued follow-up with PCP for future prescribing and monitoring of lipid panel 4. DMII: Advised to continue to monitor glucose levels at home along with continued follow-up with PCP for management and monitoring    Follow up in *** or call earlier if needed   Greater than 50% of time during this 45 minute visit was spent on counseling, explanation of diagnosis of ***, reviewing risk factor management of ***, planning of further management along with potential future management, and discussion with patient and family answering all questions.    George HughJessica Jourdon Zimmerle, AGNP-BC  [facility] [address] [place], [place] [address]  Phone [phone] Fax [phone] Note: This document was prepared with digital dictation and possible smart phrase technology. Any transcriptional errors that result from this process are unintentional.

## 2020-04-21 ENCOUNTER — Other Ambulatory Visit: Payer: Self-pay | Admitting: Internal Medicine

## 2020-04-21 DIAGNOSIS — Z1231 Encounter for screening mammogram for malignant neoplasm of breast: Secondary | ICD-10-CM

## 2020-06-21 ENCOUNTER — Ambulatory Visit: Payer: Medicare Other

## 2020-07-05 ENCOUNTER — Ambulatory Visit: Payer: Medicare Other

## 2020-07-14 ENCOUNTER — Other Ambulatory Visit: Payer: Self-pay

## 2020-07-14 ENCOUNTER — Ambulatory Visit
Admission: RE | Admit: 2020-07-14 | Discharge: 2020-07-14 | Disposition: A | Discharge status: Home / Self Care | Payer: Medicare Other | Source: Ambulatory Visit | Attending: Internal Medicine | Admitting: Internal Medicine

## 2020-07-14 DIAGNOSIS — Z1231 Encounter for screening mammogram for malignant neoplasm of breast: Secondary | ICD-10-CM

## 2020-07-20 ENCOUNTER — Other Ambulatory Visit: Payer: Self-pay | Admitting: Internal Medicine

## 2020-07-20 DIAGNOSIS — Z1231 Encounter for screening mammogram for malignant neoplasm of breast: Secondary | ICD-10-CM

## 2021-11-21 ENCOUNTER — Emergency Department (HOSPITAL_COMMUNITY): Payer: Medicare Other

## 2021-11-21 ENCOUNTER — Other Ambulatory Visit: Payer: Self-pay

## 2021-11-21 ENCOUNTER — Inpatient Hospital Stay (HOSPITAL_COMMUNITY)
Admission: EM | Admit: 2021-11-21 | Discharge: 2021-11-26 | DRG: 871 | Disposition: A | Discharge status: Skilled Nursing Facility | Payer: Medicare Other | Source: Skilled Nursing Facility | Attending: Internal Medicine | Admitting: Internal Medicine

## 2021-11-21 ENCOUNTER — Inpatient Hospital Stay (HOSPITAL_COMMUNITY): Payer: Medicare Other

## 2021-11-21 ENCOUNTER — Encounter (HOSPITAL_COMMUNITY): Payer: Self-pay

## 2021-11-21 DIAGNOSIS — R4182 Altered mental status, unspecified: Secondary | ICD-10-CM | POA: Diagnosis present

## 2021-11-21 DIAGNOSIS — E1165 Type 2 diabetes mellitus with hyperglycemia: Secondary | ICD-10-CM | POA: Diagnosis present

## 2021-11-21 DIAGNOSIS — U071 COVID-19: Secondary | ICD-10-CM | POA: Diagnosis present

## 2021-11-21 DIAGNOSIS — I69354 Hemiplegia and hemiparesis following cerebral infarction affecting left non-dominant side: Secondary | ICD-10-CM

## 2021-11-21 DIAGNOSIS — Z0189 Encounter for other specified special examinations: Secondary | ICD-10-CM

## 2021-11-21 DIAGNOSIS — A4189 Other specified sepsis: Principal | ICD-10-CM | POA: Diagnosis present

## 2021-11-21 DIAGNOSIS — I110 Hypertensive heart disease with heart failure: Secondary | ICD-10-CM | POA: Diagnosis present

## 2021-11-21 DIAGNOSIS — I5032 Chronic diastolic (congestive) heart failure: Secondary | ICD-10-CM | POA: Diagnosis present

## 2021-11-21 DIAGNOSIS — J1282 Pneumonia due to coronavirus disease 2019: Secondary | ICD-10-CM | POA: Diagnosis present

## 2021-11-21 DIAGNOSIS — J1008 Influenza due to other identified influenza virus with other specified pneumonia: Secondary | ICD-10-CM | POA: Diagnosis present

## 2021-11-21 DIAGNOSIS — R0603 Acute respiratory distress: Secondary | ICD-10-CM | POA: Diagnosis not present

## 2021-11-21 DIAGNOSIS — M7989 Other specified soft tissue disorders: Secondary | ICD-10-CM | POA: Diagnosis not present

## 2021-11-21 DIAGNOSIS — F17200 Nicotine dependence, unspecified, uncomplicated: Secondary | ICD-10-CM | POA: Diagnosis present

## 2021-11-21 DIAGNOSIS — G9341 Metabolic encephalopathy: Secondary | ICD-10-CM | POA: Diagnosis present

## 2021-11-21 DIAGNOSIS — Z7982 Long term (current) use of aspirin: Secondary | ICD-10-CM | POA: Diagnosis not present

## 2021-11-21 DIAGNOSIS — J9601 Acute respiratory failure with hypoxia: Secondary | ICD-10-CM | POA: Diagnosis present

## 2021-11-21 DIAGNOSIS — Z86711 Personal history of pulmonary embolism: Secondary | ICD-10-CM | POA: Diagnosis not present

## 2021-11-21 DIAGNOSIS — Z79899 Other long term (current) drug therapy: Secondary | ICD-10-CM

## 2021-11-21 DIAGNOSIS — E876 Hypokalemia: Secondary | ICD-10-CM | POA: Diagnosis present

## 2021-11-21 DIAGNOSIS — A419 Sepsis, unspecified organism: Secondary | ICD-10-CM

## 2021-11-21 DIAGNOSIS — G40909 Epilepsy, unspecified, not intractable, without status epilepticus: Secondary | ICD-10-CM | POA: Diagnosis present

## 2021-11-21 DIAGNOSIS — Z8249 Family history of ischemic heart disease and other diseases of the circulatory system: Secondary | ICD-10-CM

## 2021-11-21 LAB — I-STAT ARTERIAL BLOOD GAS, ED
Acid-Base Excess: 3 mmol/L — ABNORMAL HIGH (ref 0.0–2.0)
Bicarbonate: 27.9 mmol/L (ref 20.0–28.0)
Calcium, Ion: 1.09 mmol/L — ABNORMAL LOW (ref 1.15–1.40)
HCT: 38 % (ref 36.0–46.0)
Hemoglobin: 12.9 g/dL (ref 12.0–15.0)
O2 Saturation: 100 %
Patient temperature: 98.6
Potassium: 2.8 mmol/L — ABNORMAL LOW (ref 3.5–5.1)
Sodium: 141 mmol/L (ref 135–145)
TCO2: 29 mmol/L (ref 22–32)
pCO2 arterial: 41.4 mmHg (ref 32.0–48.0)
pH, Arterial: 7.436 (ref 7.350–7.450)
pO2, Arterial: 390 mmHg — ABNORMAL HIGH (ref 83.0–108.0)

## 2021-11-21 LAB — CBG MONITORING, ED
Glucose-Capillary: 107 mg/dL — ABNORMAL HIGH (ref 70–99)
Glucose-Capillary: 104 mg/dL — ABNORMAL HIGH (ref 70–99)

## 2021-11-21 LAB — URINALYSIS, ROUTINE W REFLEX MICROSCOPIC
Bilirubin Urine: NEGATIVE
Glucose, UA: NEGATIVE mg/dL
Ketones, ur: NEGATIVE mg/dL
Leukocytes,Ua: NEGATIVE
Nitrite: NEGATIVE
Protein, ur: NEGATIVE mg/dL
Specific Gravity, Urine: 1.02 (ref 1.005–1.030)
pH: 7 (ref 5.0–8.0)

## 2021-11-21 LAB — COMPREHENSIVE METABOLIC PANEL
ALT: 9 U/L (ref 0–44)
AST: 21 U/L (ref 15–41)
Albumin: 3.1 g/dL — ABNORMAL LOW (ref 3.5–5.0)
Alkaline Phosphatase: 50 U/L (ref 38–126)
Anion gap: 16 — ABNORMAL HIGH (ref 5–15)
BUN: 8 mg/dL (ref 8–23)
CO2: 24 mmol/L (ref 22–32)
Calcium: 8.1 mg/dL — ABNORMAL LOW (ref 8.9–10.3)
Chloride: 101 mmol/L (ref 98–111)
Creatinine, Ser: 0.77 mg/dL (ref 0.44–1.00)
GFR, Estimated: >60 mL/min (ref >60)
Glucose, Bld: 114 mg/dL — ABNORMAL HIGH (ref 70–99)
Potassium: 3.1 mmol/L — ABNORMAL LOW (ref 3.5–5.1)
Sodium: 141 mmol/L (ref 135–145)
Total Bilirubin: 0.4 mg/dL (ref 0.3–1.2)
Total Protein: 6.1 g/dL — ABNORMAL LOW (ref 6.5–8.1)

## 2021-11-21 LAB — CBC WITH DIFFERENTIAL/PLATELET
Abs Immature Granulocytes: 0.03 10*3/uL (ref 0.00–0.07)
Basophils Absolute: 0 10*3/uL (ref 0.0–0.1)
Basophils Relative: 0 %
Eosinophils Absolute: 0.2 10*3/uL (ref 0.0–0.5)
Eosinophils Relative: 3 %
HCT: 38.8 % (ref 36.0–46.0)
Hemoglobin: 12.3 g/dL (ref 12.0–15.0)
Immature Granulocytes: 1 %
Lymphocytes Relative: 7 %
Lymphs Abs: 0.5 10*3/uL — ABNORMAL LOW (ref 0.7–4.0)
MCH: 24.3 pg — ABNORMAL LOW (ref 26.0–34.0)
MCHC: 31.7 g/dL (ref 30.0–36.0)
MCV: 76.5 fL — ABNORMAL LOW (ref 80.0–100.0)
Monocytes Absolute: 0.5 10*3/uL (ref 0.1–1.0)
Monocytes Relative: 7 %
Neutro Abs: 5.2 10*3/uL (ref 1.7–7.7)
Neutrophils Relative %: 82 %
Platelets: 173 10*3/uL (ref 150–400)
RBC: 5.07 MIL/uL (ref 3.87–5.11)
RDW: 15.4 % (ref 11.5–15.5)
WBC: 6.4 10*3/uL (ref 4.0–10.5)
nRBC: 0 % (ref 0.0–0.2)

## 2021-11-21 LAB — RESP PANEL BY RT-PCR (FLU A&B, COVID) ARPGX2
Influenza A by PCR: POSITIVE — AB
Influenza B by PCR: NEGATIVE
SARS Coronavirus 2 by RT PCR: POSITIVE — AB

## 2021-11-21 LAB — PROTIME-INR
INR: 1.2 (ref 0.8–1.2)
Prothrombin Time: 15.3 seconds — ABNORMAL HIGH (ref 11.4–15.2)

## 2021-11-21 LAB — URINALYSIS, MICROSCOPIC (REFLEX): Bacteria, UA: NONE SEEN

## 2021-11-21 LAB — LACTIC ACID, PLASMA
Lactic Acid, Venous: 2.4 mmol/L (ref 0.5–1.9)
Lactic Acid, Venous: 3 mmol/L (ref 0.5–1.9)

## 2021-11-21 LAB — TROPONIN I (HIGH SENSITIVITY)
Troponin I (High Sensitivity): 24 ng/L — ABNORMAL HIGH (ref <18)
Troponin I (High Sensitivity): 22 ng/L — ABNORMAL HIGH (ref <18)

## 2021-11-21 LAB — GLUCOSE, CAPILLARY: Glucose-Capillary: 97 mg/dL (ref 70–99)

## 2021-11-21 MED ORDER — POTASSIUM CHLORIDE 10 MEQ/50ML IV SOLN
10.0000 meq | INTRAVENOUS | Status: AC
  Administered 2021-11-21 – 2021-11-22 (×4): 10 meq via INTRAVENOUS
  Filled 2021-11-21 (×5): qty 50

## 2021-11-21 MED ORDER — ETOMIDATE 2 MG/ML IV SOLN
INTRAVENOUS | Status: AC
  Administered 2021-11-21: 20 mg
  Filled 2021-11-21: qty 20

## 2021-11-21 MED ORDER — LACTATED RINGERS IV BOLUS
2000.0000 mL | Freq: Once | INTRAVENOUS | Status: AC
  Administered 2021-11-21: 2000 mL via INTRAVENOUS

## 2021-11-21 MED ORDER — POLYETHYLENE GLYCOL 3350 17 G PO PACK
17.0000 g | PACK | Freq: Every day | ORAL | Status: DC
  Administered 2021-11-21 – 2021-11-23 (×3): 17 g
  Filled 2021-11-21 (×3): qty 1

## 2021-11-21 MED ORDER — FENTANYL CITRATE (PF) 100 MCG/2ML IJ SOLN
50.0000 ug | INTRAMUSCULAR | Status: DC | PRN
  Filled 2021-11-21: qty 2

## 2021-11-21 MED ORDER — CHLORHEXIDINE GLUCONATE CLOTH 2 % EX PADS
6.0000 | MEDICATED_PAD | Freq: Every day | CUTANEOUS | Status: DC
  Administered 2021-11-21 – 2021-11-24 (×4): 6 via TOPICAL

## 2021-11-21 MED ORDER — LACTATED RINGERS IV SOLN: INTRAVENOUS | Status: AC

## 2021-11-21 MED ORDER — SODIUM CHLORIDE 0.9 % IV SOLN
2.0000 g | Freq: Once | INTRAVENOUS | Status: AC
  Administered 2021-11-21: 2 g via INTRAVENOUS
  Filled 2021-11-21: qty 2

## 2021-11-21 MED ORDER — SODIUM CHLORIDE 0.9 % IV BOLUS (SEPSIS): 1000.0000 mL | Freq: Once | INTRAVENOUS | Status: DC

## 2021-11-21 MED ORDER — PANTOPRAZOLE SODIUM 40 MG IV SOLR
40.0000 mg | Freq: Every day | INTRAVENOUS | Status: DC
  Administered 2021-11-21 – 2021-11-23 (×3): 40 mg via INTRAVENOUS
  Filled 2021-11-21 (×3): qty 40

## 2021-11-21 MED ORDER — FENTANYL CITRATE PF 50 MCG/ML IJ SOSY: 50.0000 ug | PREFILLED_SYRINGE | INTRAMUSCULAR | Status: DC | PRN

## 2021-11-21 MED ORDER — DOCUSATE SODIUM 100 MG PO CAPS: 100.0000 mg | ORAL_CAPSULE | Freq: Two times a day (BID) | ORAL | Status: DC | PRN

## 2021-11-21 MED ORDER — FENTANYL CITRATE (PF) 100 MCG/2ML IJ SOLN
50.0000 ug | INTRAMUSCULAR | Status: DC | PRN
  Administered 2021-11-21 – 2021-11-22 (×3): 100 ug via INTRAVENOUS
  Filled 2021-11-21 (×3): qty 2

## 2021-11-21 MED ORDER — POTASSIUM CHLORIDE 20 MEQ PO PACK
20.0000 meq | PACK | ORAL | Status: AC
  Administered 2021-11-21 – 2021-11-22 (×2): 20 meq
  Filled 2021-11-21 (×2): qty 1

## 2021-11-21 MED ORDER — METRONIDAZOLE 500 MG/100ML IV SOLN
500.0000 mg | Freq: Once | INTRAVENOUS | Status: AC
  Administered 2021-11-21: 500 mg via INTRAVENOUS
  Filled 2021-11-21: qty 100

## 2021-11-21 MED ORDER — DOCUSATE SODIUM 50 MG/5ML PO LIQD: 100.0000 mg | Freq: Two times a day (BID) | ORAL | Status: DC | PRN

## 2021-11-21 MED ORDER — HEPARIN SODIUM (PORCINE) 5000 UNIT/ML IJ SOLN
5000.0000 [IU] | Freq: Three times a day (TID) | INTRAMUSCULAR | Status: DC
  Administered 2021-11-22 – 2021-11-26 (×14): 5000 [IU] via SUBCUTANEOUS
  Filled 2021-11-21 (×13): qty 1

## 2021-11-21 MED ORDER — SODIUM CHLORIDE 0.9 % IV SOLN
2.0000 g | Freq: Three times a day (TID) | INTRAVENOUS | Status: DC
  Administered 2021-11-22: 2 g via INTRAVENOUS
  Filled 2021-11-21 (×2): qty 2

## 2021-11-21 MED ORDER — SODIUM CHLORIDE 0.9 % IV SOLN
100.0000 mg | Freq: Every day | INTRAVENOUS | Status: DC
  Filled 2021-11-21 (×3): qty 20

## 2021-11-21 MED ORDER — OSELTAMIVIR PHOSPHATE 75 MG PO CAPS
75.0000 mg | ORAL_CAPSULE | Freq: Two times a day (BID) | ORAL | Status: DC
  Administered 2021-11-21 – 2021-11-23 (×5): 75 mg
  Filled 2021-11-21 (×6): qty 1

## 2021-11-21 MED ORDER — PROPOFOL 1000 MG/100ML IV EMUL
5.0000 ug/kg/min | INTRAVENOUS | Status: DC
  Administered 2021-11-21: 5 ug/kg/min via INTRAVENOUS
  Filled 2021-11-21: qty 100

## 2021-11-21 MED ORDER — ACETAMINOPHEN 650 MG RE SUPP
650.0000 mg | Freq: Once | RECTAL | Status: AC
  Administered 2021-11-21: 650 mg via RECTAL
  Filled 2021-11-21: qty 1

## 2021-11-21 MED ORDER — VANCOMYCIN HCL 1250 MG/250ML IV SOLN: 1250.0000 mg | INTRAVENOUS | Status: DC

## 2021-11-21 MED ORDER — SODIUM CHLORIDE 0.9 % IV SOLN
200.0000 mg | Freq: Once | INTRAVENOUS | Status: AC
  Administered 2021-11-22: 200 mg via INTRAVENOUS
  Filled 2021-11-21: qty 40

## 2021-11-21 MED ORDER — POLYETHYLENE GLYCOL 3350 17 G PO PACK: 17.0000 g | PACK | Freq: Every day | ORAL | Status: DC | PRN

## 2021-11-21 MED ORDER — DOCUSATE SODIUM 50 MG/5ML PO LIQD: 100.0000 mg | Freq: Two times a day (BID) | ORAL | Status: DC

## 2021-11-21 MED ORDER — ORAL CARE MOUTH RINSE
15.0000 mL | OROMUCOSAL | Status: DC
  Administered 2021-11-21 – 2021-11-24 (×23): 15 mL via OROMUCOSAL

## 2021-11-21 MED ORDER — VANCOMYCIN HCL 1250 MG/250ML IV SOLN
1250.0000 mg | Freq: Once | INTRAVENOUS | Status: AC
  Administered 2021-11-21: 1250 mg via INTRAVENOUS
  Filled 2021-11-21: qty 250

## 2021-11-21 MED ORDER — CHLORHEXIDINE GLUCONATE 0.12% ORAL RINSE (MEDLINE KIT)
15.0000 mL | Freq: Two times a day (BID) | OROMUCOSAL | Status: DC
  Administered 2021-11-21 – 2021-11-25 (×9): 15 mL via OROMUCOSAL

## 2021-11-21 MED ORDER — IOHEXOL 350 MG/ML SOLN
50.0000 mL | Freq: Once | INTRAVENOUS | Status: AC | PRN
  Administered 2021-11-21: 50 mL via INTRAVENOUS

## 2021-11-21 MED ORDER — DEXAMETHASONE SODIUM PHOSPHATE 10 MG/ML IJ SOLN
6.0000 mg | INTRAMUSCULAR | Status: DC
  Administered 2021-11-21 – 2021-11-25 (×5): 6 mg via INTRAVENOUS
  Filled 2021-11-21 (×5): qty 1

## 2021-11-21 MED ORDER — PROPOFOL 1000 MG/100ML IV EMUL
0.0000 ug/kg/min | INTRAVENOUS | Status: AC
  Administered 2021-11-21: 35 ug/kg/min via INTRAVENOUS
  Administered 2021-11-22 (×2): 40 ug/kg/min via INTRAVENOUS
  Filled 2021-11-21 (×3): qty 100

## 2021-11-21 MED ORDER — INSULIN ASPART 100 UNIT/ML IJ SOLN
0.0000 [IU] | INTRAMUSCULAR | Status: DC
  Administered 2021-11-22 (×2): 1 [IU] via SUBCUTANEOUS
  Administered 2021-11-23: 3 [IU] via SUBCUTANEOUS
  Administered 2021-11-23 (×5): 2 [IU] via SUBCUTANEOUS
  Administered 2021-11-24: 1 [IU] via SUBCUTANEOUS
  Administered 2021-11-24: 2 [IU] via SUBCUTANEOUS

## 2021-11-21 MED ORDER — ROCURONIUM BROMIDE 10 MG/ML (PF) SYRINGE
PREFILLED_SYRINGE | INTRAVENOUS | Status: AC
  Administered 2021-11-21: 100 mg
  Filled 2021-11-21: qty 10

## 2021-11-21 NOTE — Sepsis Progress Note (Signed)
eLink is monitoring this Code Sepsis. °

## 2021-11-21 NOTE — Progress Notes (Addendum)
eLink Physician-Brief Progress Note Patient Name: [name] DOB: August 20, 1956 MRN: [id number]   Date of Service  11/21/2021  HPI/Events of Note  59 F history of CVA wit residual left sided weakness, HFpEF, DM, HTN, bipolar disorder, presented with altered sensorium and fever. Intubated. COVID and Flu A positive.  BP 160/91  HR 129  O2 95% Sedated on propofol Intubated PRVC 18/400/40%/5 PEEP MV 12  eICU Interventions  ON Remdesevir, Oseltamivir, Vancomycin and Cefepime CTPA pending, prior history of PE  Follow-up CTPA negative for PE, left lung base consolidation     Intervention Category Evaluation Type: New Patient Evaluation  Darl Pikes 11/21/2021, 11:19 PM

## 2021-11-21 NOTE — Progress Notes (Signed)
Pt transported by RN x2 and RT to CT to 3M11 w/ no complications

## 2021-11-21 NOTE — ED Provider Notes (Signed)
[facility] EMERGENCY DEPARTMENT Provider Note   CSN: [id number] Arrival date & time: 11/21/21  1656     History Past Medical History:  Diagnosis Date   Blood transfusion without reported diagnosis    CHF (congestive heart failure) (HCC)    Hypertension    Stroke [facility])     Chief Complaint  Patient presents with   Code Sepsis    [name] is a 66 y.o. female.  Patient coming in from nursing facility. Initially mumbling and would minimally follow commands but mental status declined and no history was able to be obtained.  Tried to contact family via phone but unable to reach them.        Home Medications Prior to Admission medications   Medication Sig Start Date End Date Taking? Authorizing Provider  acetaminophen (TYLENOL) 325 MG tablet Take 2 tablets (650 mg total) by mouth every 4 (four) hours as needed for mild pain (or temp > 37.5 C (99.5 F)). 03/02/19   Angiulli, Mcarthur Rossetti, PA-C  amLODipine (NORVASC) 2.5 MG tablet Take 3 tablets (7.5 mg total) by mouth daily. 03/02/19   Angiulli, Mcarthur Rossetti, PA-C  aspirin 325 MG tablet Take 1 tablet (325 mg total) by mouth daily. 02/07/19   Arnetha Courser, MD  atorvastatin (LIPITOR) 40 MG tablet Take 1 tablet (40 mg total) by mouth daily at 6 PM. Patient taking differently: Take 40 mg by mouth at bedtime.  02/06/19   Rehman, Areeg N, DO  levETIRAcetam (KEPPRA) 750 MG tablet Take 1 tablet (750 mg total) by mouth 2 (two) times daily. 03/02/19   Angiulli, Mcarthur Rossetti, PA-C  nicotine (NICODERM CQ - DOSED IN MG/24 HOURS) 14 mg/24hr patch Place 14 mg onto the skin daily.    [provider]  pantoprazole (PROTONIX) 40 MG tablet Take 40 mg by mouth daily. 12/23/18   [provider]  pregabalin (LYRICA) 75 MG capsule Take 1 capsule (75 mg total) by mouth daily. Patient taking differently: Take 75 mg by mouth every 12 (twelve) hours.  03/02/19   Angiulli, Mcarthur Rossetti, PA-C  risperiDONE (RISPERDAL) 0.5 MG tablet Take 0.5  mg by mouth 2 (two) times daily. 8am, 4pm    [provider]  risperiDONE (RISPERDAL) 1 MG tablet Take 1 tablet (1 mg total) by mouth at bedtime. Patient not taking: Reported on 04/11/2019 03/02/19   Angiulli, Mcarthur Rossetti, PA-C  senna-docusate (SENNA S) 8.6-50 MG tablet Take 1 tablet by mouth 2 (two) times daily.    [provider]  sertraline (ZOLOFT) 25 MG tablet Take 25 mg by mouth daily.    [provider]  tiZANidine (ZANAFLEX) 4 MG tablet Take 2 tablets (8 mg total) by mouth 2 (two) times daily. Patient taking differently: Take 8 mg by mouth 3 (three) times daily. For spastic hemiparesis 03/02/19   Angiulli, Mcarthur Rossetti, PA-C  traMADol (ULTRAM) 50 MG tablet Take 50 mg by mouth every 6 (six) hours as needed (pain).    [provider]      Allergies    Patient has no known allergies.    Review of Systems   Review of Systems  Unable to perform ROS: Intubated   Physical Exam Updated Vital Signs BP 130/65    Pulse (!) 108    Temp 99.1 F (37.3 C)    Resp 18    Ht 5\' 2"  (1.575 m)    Wt 65.8 kg    SpO2 100%    BMI 26.53 kg/m  Physical Exam Constitutional:      Appearance: She is ill-appearing.     Interventions: She is intubated.  HENT:     Head: Normocephalic and atraumatic.  Cardiovascular:     Rate and Rhythm: Regular rhythm. Tachycardia present.  Pulmonary:     Effort: She is intubated.  Abdominal:     Palpations: Abdomen is soft.     Tenderness: There is abdominal tenderness.  Musculoskeletal:     Left lower leg: Swelling present.  Skin:    General: Skin is warm and dry.    ED Results / Procedures / Treatments   Labs (all labs ordered are listed, but only abnormal results are displayed) Labs Reviewed  LACTIC ACID, PLASMA - Abnormal; Notable for the following components:      Result Value   Lactic Acid, Venous 2.4 (*)    All other components within normal limits  COMPREHENSIVE METABOLIC PANEL - Abnormal; Notable for the following  components:   Potassium 3.1 (*)    Glucose, Bld 114 (*)    Calcium 8.1 (*)    Total Protein 6.1 (*)    Albumin 3.1 (*)    Anion gap 16 (*)    All other components within normal limits  CBC WITH DIFFERENTIAL/PLATELET - Abnormal; Notable for the following components:   MCV 76.5 (*)    MCH 24.3 (*)    Lymphs Abs 0.5 (*)    All other components within normal limits  PROTIME-INR - Abnormal; Notable for the following components:   Prothrombin Time 15.3 (*)    All other components within normal limits  I-STAT ARTERIAL BLOOD GAS, ED - Abnormal; Notable for the following components:   pO2, Arterial 390 (*)    Acid-Base Excess 3.0 (*)    Potassium 2.8 (*)    Calcium, Ion 1.09 (*)    All other components within normal limits  TROPONIN I (HIGH SENSITIVITY) - Abnormal; Notable for the following components:   Troponin I (High Sensitivity) 24 (*)    All other components within normal limits  RESP PANEL BY RT-PCR (FLU A&B, COVID) ARPGX2  CULTURE, BLOOD (ROUTINE X 2)  CULTURE, BLOOD (ROUTINE X 2)  URINE CULTURE  LACTIC ACID, PLASMA  URINALYSIS, ROUTINE W REFLEX MICROSCOPIC  CBG MONITORING, ED  TROPONIN I (HIGH SENSITIVITY)    EKG None  Radiology DG Chest Portable 1 View  Result Date: 11/21/2021 CLINICAL DATA:  Cough and fever EXAM: PORTABLE CHEST 1 VIEW COMPARISON:  11/21/2021, CT 09/03/2025 FINDINGS: Interval intubation, tip of the endotracheal tube is about 2.3 cm superior to carina. Cardiomegaly. Patchy airspace disease at left base. No pleural effusion or pneumothorax. IMPRESSION: 1. Endotracheal tube tip about 2.3 cm superior to carina. 2. Suspicion of patchy airspace disease at left base which may be due to atelectasis or pneumonia 3. Cardiomegaly Electronically Signed   By: Jasmine PangKim  Fujinaga M.D.   On: 11/21/2021 19:20   DG Chest Port 1 View  Result Date: 11/21/2021 CLINICAL DATA:  Questionable sepsis. EXAM: PORTABLE CHEST 1 VIEW COMPARISON:  Chest x-ray 02/16/2019. FINDINGS: The heart  size and mediastinal contours are within normal limits. Both lungs are clear. The visualized skeletal structures are unremarkable. IMPRESSION: No active disease. Electronically Signed   By: Darliss CheneyAmy  Guttmann M.D.   On: 11/21/2021 18:06    Procedures Procedures    Medications Ordered in ED Medications  lactated ringers infusion ( Intravenous New Bag/Given 11/21/21 1928)  metroNIDAZOLE (FLAGYL) IVPB 500 mg (500 mg Intravenous New Bag/Given 11/21/21 1913)  vancomycin (VANCOREADY) IVPB 1250 mg/250 mL (1,250 mg Intravenous New Bag/Given 11/21/21 1929)  propofol (DIPRIVAN) 1000 MG/100ML infusion (5 mcg/kg/min  65.8 kg Intravenous New Bag/Given 11/21/21 1914)  ceFEPIme (MAXIPIME) 2 g in sodium chloride 0.9 % 100 mL IVPB (2 g Intravenous New Bag/Given 11/21/21 1916)  acetaminophen (TYLENOL) suppository 650 mg (650 mg Rectal Given 11/21/21 1747)  rocuronium bromide 100 MG/10ML SOSY (100 mg  Given 11/21/21 1830)  etomidate (AMIDATE) 2 MG/ML injection (20 mg  Given 11/21/21 1830)  lactated ringers bolus 2,000 mL (0 mLs Intravenous Stopped 11/21/21 1934)    ED Course/ Medical Decision Making/ A&P                           Medical Decision Making  Patient came in with a fever to 103, AMS, and was unable to protect her airway. IV access was attempted but unable to be obtained so a femoral line was placed and patient was subsequently intubated. Patient's overall presentation concerning for sepsis. Lactic acid slightly elevated at 2.4. Pressures have been stable and she is not requiring pressers at this time. Will consult intensivist as patient will likely need ICU level care.  Final Clinical Impression(s) / ED Diagnoses Final diagnoses:  Altered mental status, unspecified altered mental status type  Sepsis, due to unspecified organism, unspecified whether acute organ dysfunction present [facility])    Rx / DC Orders ED Discharge Orders     None         Ilene Qua, MD 11/21/21 1958    Milagros Loll, MD 11/22/21 0020

## 2021-11-21 NOTE — ED Notes (Signed)
Pt with purposeful movements and opening eyes. Sedation increased.

## 2021-11-21 NOTE — H&P (Addendum)
NAME:  [name], MRN:  [id number], DOB:  23-Apr-1956, LOS: 0 ADMISSION DATE:  11/21/2021 CONSULTATION DATE:  11/21/2021 REFERRING MD:  Stevie Kernykstra - EDP CHIEF COMPLAINT:  Sepsis   History of Present Illness:  66 year old woman who [facility] 1/10 via EMS for fever, cough and AMS. 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   Per chart review, [facility] for AMS, fever to Tmax 103 and cough. Patient was only oriented to self on arrival. Placed on NRB. Labs were notable for WBC 6.4, K 3.1 (repleted), LA 2.4, troponin 24. CXR demonstrated patchy airspace disease at L base. Empiric vanc/cefepime/Flagyl was initiated and cultures collected. R femoral CVC was placed for difficult access. Patient was intubated at 1844. COVID and Flu A positive. CTA Chest and LE dopplers were ordered for PE ruleout in the setting of persistent hypoxia.  PCCM consulted for admission.  Pertinent Medical History:   Past Medical History:  Diagnosis Date   Blood transfusion without reported diagnosis    CHF (congestive heart failure) (HCC)    Hypertension    Stroke (HCC)    Significant Hospital Events: Including procedures, antibiotic start and stop dates in addition to other pertinent events   1/10 - [facility], fever, c/f sepsis. Intubated for worsening mental status and hypoxemia. R femoral CVC placed. Empiric cefepime/vanc/Flagyl.  Interim History / Subjective:  PCCM consulted for admission.  Objective:  Blood pressure 130/65, pulse (!) 108, temperature 99.1 F (37.3 C), resp. rate 18, height 5\' 2"  (1.575 m), weight 65.8 kg, SpO2 100 %.    Vent Mode: PRVC FiO2 (%):  [40 %-100 %] 40 % Set Rate:  [18 bmp] 18 bmp Vt Set:  [400 mL] 400 mL PEEP:  [5 cmH20] 5 cmH20 Plateau Pressure:  [16 cmH20-22 cmH20] 16 cmH20   Intake/Output Summary (Last 24 hours) at  11/21/2021 2004 Last data filed at 11/21/2021 1934 Gross per 24 hour  Intake 2000 ml  Output --  Net 2000 ml   Filed Weights   11/21/21 1706  Weight: 65.8 kg   Physical Examination: General: Acutely ill-appearing middle-aged woman in NAD. HEENT: [place]/AT, anicteric sclera, PERRL, moist mucous membranes. ETT/OGT in place. Neuro: Sedated. Responds to verbal stimuli. Following commands consistently. Unilateral L-sided neglect noted. +Cough and +Gag  CV: RRR, no m/g/r. PULM: Breathing even and unlabored on vent (PEEP 5, FiO2 40%). Lung fields with scattered rhonchi, R > L. GI: Soft, nontender, nondistended. Normoactive bowel sounds. Extremities: Unilateral LLE edema noted, ?chronic. Skin: Warm/dry, no rashes.  Resolved Hospital Problem List:    Assessment & Plan:  Sepsis secondary to viral pneumonia (COVID, Flu A) Presented with fever to Tmax 103F, AMS and cough. COVID and Flu A positive on RVP. - Admit to ICU - Lung-protective ventilation strategies as below - Dexamethasone x 10-day course - Remdesivir x 5-day course, monitor LFTs - Tamiflu x 5-day course - F/u CT Chest - Continue empiric vanc/cefepime - F/u finalized Cx data including Resp Cx, Bcx - Trend LA, WBC, fever curve  Acute hypoxemic respiratory failure - Continue full vent support (4-8cc/kg IBW) - Wean FiO2 for O2 sat > 90% - Daily WUA/SBT - VAP bundle - Pulmonary hygiene - Bronchodilators per RT protocol - PAD protocol for sedation: Propofol and Fentanyl for goal RASS 0 to -1  Acute metabolic encephalopathy secondary to above  History of R MCA stroke History of seizure Unilateral residual L-sided deficits from prior stroke. - Continue ASA, statin - F/u CT Head - STAT EEG given seizure history - Continue home Keppra  History of PE (2006), not on [facility] - F/u CTA Chest, r/o PE - F/u LE dopplers  At risk for hyperglycemia - CBG Q4H - SSI  Best Practice: (right click and "Reselect all SmartList Selections"  daily)   Diet/type: NPO DVT prophylaxis: prophylactic heparin  GI prophylaxis: PPI Lines: Central line Foley:  Yes, and it is still needed Code Status:  full code Last date of multidisciplinary goals of care discussion [Pending]  Labs:  CBC: Recent Labs  Lab 11/21/21 1850 11/21/21 1926  WBC 6.4  --   NEUTROABS 5.2  --   HGB 12.3 12.9  HCT 38.8 38.0  MCV 76.5*  --   PLT 173  --    Basic Metabolic Panel: Recent Labs  Lab 11/21/21 1850 11/21/21 1926  NA 141 141  K 3.1* 2.8*  CL 101  --   CO2 24  --   GLUCOSE 114*  --   BUN 8  --   CREATININE 0.77  --   CALCIUM 8.1*  --    GFR: Estimated Creatinine Clearance: 63.3 mL/min (by C-G formula based on SCr of 0.77 mg/dL). Recent Labs  Lab 11/21/21 1850  WBC 6.4  LATICACIDVEN 2.4*   Liver Function Tests: Recent Labs  Lab 11/21/21 1850  AST 21  ALT 9  ALKPHOS 50  BILITOT 0.4  PROT 6.1*  ALBUMIN 3.1*   No results for input(s): LIPASE, AMYLASE in the last 168 hours. No results for input(s): AMMONIA in the last 168 hours.  ABG:    Component Value Date/Time   PHART 7.436 11/21/2021 1926   PCO2ART 41.4 11/21/2021 1926   PO2ART 390 (H) 11/21/2021 1926   HCO3 27.9 11/21/2021 1926   TCO2 29 11/21/2021 1926   O2SAT 100.0 11/21/2021 1926    Coagulation Profile: Recent Labs  Lab 11/21/21 1850  INR 1.2   Cardiac Enzymes: No results for input(s): CKTOTAL, CKMB, CKMBINDEX, TROPONINI in the last 168 hours.  HbA1C: Hgb A1c MFr Bld  Date/Time Value Ref Range Status  02/04/2019 05:10 AM 5.8 (H) 4.8 - 5.6 % Final    Comment:    (NOTE) Pre diabetes:          5.7%-6.4% Diabetes:              >6.4% Glycemic control for   <7.0% adults with diabetes   02/03/2019 05:17 PM 5.8 (H) 4.8 - 5.6 % Final    Comment:    (NOTE) Pre diabetes:          5.7%-6.4% Diabetes:              >6.4% Glycemic control for   <7.0% adults with diabetes    CBG: No results for input(s): GLUCAP in the last 168 hours.  Review of  Systems:   Patient is encephalopathic and/or intubated. Therefore history has been obtained from chart review.   Past Medical History:  She,  has a past medical history of Blood transfusion without reported diagnosis, CHF (congestive heart failure) (HCC), Hypertension, and Stroke (HCC).   Surgical History:   Past Surgical History:  Procedure Laterality Date   ABDOMINAL SURGERY      Social History:   reports that she has been smoking. She has never used smokeless tobacco. She reports that she does not drink alcohol and  does not use drugs.   Family History:  Her family history includes Hypertension in her father and mother.   Allergies: No Known Allergies   Home Medications: Prior to Admission medications   Medication Sig Start Date End Date Taking? Authorizing Provider  acetaminophen (TYLENOL) 325 MG tablet Take 2 tablets (650 mg total) by mouth every 4 (four) hours as needed for mild pain (or temp > 37.5 C (99.5 F)). 03/02/19   Angiulli, Mcarthur Rossetti, PA-C  amLODipine (NORVASC) 2.5 MG tablet Take 3 tablets (7.5 mg total) by mouth daily. 03/02/19   Angiulli, Mcarthur Rossetti, PA-C  aspirin 325 MG tablet Take 1 tablet (325 mg total) by mouth daily. 02/07/19   Arnetha Courser, MD  atorvastatin (LIPITOR) 40 MG tablet Take 1 tablet (40 mg total) by mouth daily at 6 PM. Patient taking differently: Take 40 mg by mouth at bedtime.  02/06/19   Rehman, Areeg N, DO  levETIRAcetam (KEPPRA) 750 MG tablet Take 1 tablet (750 mg total) by mouth 2 (two) times daily. 03/02/19   Angiulli, Mcarthur Rossetti, PA-C  nicotine (NICODERM CQ - DOSED IN MG/24 HOURS) 14 mg/24hr patch Place 14 mg onto the skin daily.    [provider]  pantoprazole (PROTONIX) 40 MG tablet Take 40 mg by mouth daily. 12/23/18   [provider]  pregabalin (LYRICA) 75 MG capsule Take 1 capsule (75 mg total) by mouth daily. Patient taking differently: Take 75 mg by mouth every 12 (twelve) hours.  03/02/19   Angiulli, Mcarthur Rossetti, PA-C   risperiDONE (RISPERDAL) 0.5 MG tablet Take 0.5 mg by mouth 2 (two) times daily. 8am, 4pm    [provider]  risperiDONE (RISPERDAL) 1 MG tablet Take 1 tablet (1 mg total) by mouth at bedtime. Patient not taking: Reported on 04/11/2019 03/02/19   Angiulli, Mcarthur Rossetti, PA-C  senna-docusate (SENNA S) 8.6-50 MG tablet Take 1 tablet by mouth 2 (two) times daily.    [provider]  sertraline (ZOLOFT) 25 MG tablet Take 25 mg by mouth daily.    [provider]  tiZANidine (ZANAFLEX) 4 MG tablet Take 2 tablets (8 mg total) by mouth 2 (two) times daily. Patient taking differently: Take 8 mg by mouth 3 (three) times daily. For spastic hemiparesis 03/02/19   Angiulli, Mcarthur Rossetti, PA-C  traMADol (ULTRAM) 50 MG tablet Take 50 mg by mouth every 6 (six) hours as needed (pain).    [provider]    Critical care time: 2 minutes   Tim Lair, PA-C [facility] 11/21/21 8:04 PM  Please see [email or web address] for pager details.  From 7A-7P if no response, please call [phone] After hours, please call ELink [phone]

## 2021-11-21 NOTE — ED Triage Notes (Signed)
Pt arrived via [facility] from [facility] for fever, cough and AMS. Per EMS, chest x-ray was neg, COVID test neg. Pt A&Ox1 to self only.

## 2021-11-21 NOTE — Procedures (Signed)
Intubation Procedure Note  [name]  [id number]  Apr 05, 1956  Date:11/21/21  Time:6:44 PM   Provider Performing:Mithran Strike Jerilynn Mages Perrin Smack    Procedure: Intubation (31500)  Indication(s) Respiratory Failure  Consent Unable to obtain consent due to emergent nature of procedure.   Anesthesia Per MD   Time Out Verified patient identification, verified procedure, site/side was marked, verified correct patient position, special equipment/implants available, medications/allergies/relevant history reviewed, required imaging and test results available.   Sterile Technique Usual hand hygeine, masks, and gloves were used   Procedure Description Patient positioned in bed supine.  Sedation given as noted above.  Patient was intubated with endotracheal tube using Glidescope.  View was Grade 1 full glottis .  Number of attempts was 1.  Colorimetric CO2 detector was consistent with tracheal placement.   Complications/Tolerance None; patient tolerated the procedure well. Chest X-ray is ordered to verify placement.   EBL Minimal   Specimen(s) None

## 2021-11-21 NOTE — ED Notes (Signed)
Placed pt on non rebreather 

## 2021-11-21 NOTE — Progress Notes (Signed)
Pharmacy Antibiotic Note  [name] is a 66 y.o. female admitted on 11/21/2021 with sepsis.  Pharmacy has been consulted for vancomycin and cefepime dosing.  Patient with a history of HF, HTN, stroke. Patient presenting with cough and AMS.  SCr 0.77 - at baseline WBC 6.4; LA 2.4; T 99.1 F  Plan: Metronidazole per MD Cefepime 2g q8hr Vancomycin 1250 mg q24hr (eAUC 517) unless change in renal function Trend WBC, Fever, Renal function, & Clinical course F/u cultures, clinical course, WBC, fever De-escalate when able Levels at steady state  Height: 5\' 2"  (157.5 cm) Weight: 65.8 kg (145 lb 1 oz) IBW/kg (Calculated) : 50.1  No data recorded.  No results for input(s): WBC, CREATININE, LATICACIDVEN, VANCOTROUGH, VANCOPEAK, VANCORANDOM, GENTTROUGH, GENTPEAK, GENTRANDOM, TOBRATROUGH, TOBRAPEAK, TOBRARND, AMIKACINPEAK, AMIKACINTROU, AMIKACIN in the last 168 hours.  CrCl cannot be calculated (Patient's most recent lab result is older than the maximum 21 days allowed.).    No Known Allergies  Antimicrobials this admission: vancomycin 1/10 >>  cefepime 1/10 >>  metronidazole 1/10 >>   Microbiology results: Pending  Thank you for allowing pharmacy to be a part of this patients care.  3/10, PharmD, BCPS 11/21/2021 5:17 PM ED Clinical Pharmacist -  [phone]

## 2021-11-21 NOTE — ED Notes (Signed)
I attempted to get an IV on pt, but was unsuccessful. I notified Dr Stevie Kern.

## 2021-11-21 NOTE — ED Notes (Addendum)
Dr Stevie Kern attempted to get an IV and was unsuccessful. I put in consult for IV team

## 2021-11-22 ENCOUNTER — Inpatient Hospital Stay (HOSPITAL_COMMUNITY): Payer: Medicare Other

## 2021-11-22 DIAGNOSIS — R0603 Acute respiratory distress: Secondary | ICD-10-CM

## 2021-11-22 DIAGNOSIS — M7989 Other specified soft tissue disorders: Secondary | ICD-10-CM

## 2021-11-22 DIAGNOSIS — R4182 Altered mental status, unspecified: Secondary | ICD-10-CM

## 2021-11-22 DIAGNOSIS — A4189 Other specified sepsis: Secondary | ICD-10-CM | POA: Diagnosis not present

## 2021-11-22 DIAGNOSIS — U071 COVID-19: Secondary | ICD-10-CM | POA: Diagnosis not present

## 2021-11-22 LAB — LACTIC ACID, PLASMA
Lactic Acid, Venous: 3.7 mmol/L (ref 0.5–1.9)
Lactic Acid, Venous: 3 mmol/L (ref 0.5–1.9)

## 2021-11-22 LAB — GLUCOSE, CAPILLARY
Glucose-Capillary: 145 mg/dL — ABNORMAL HIGH (ref 70–99)
Glucose-Capillary: 142 mg/dL — ABNORMAL HIGH (ref 70–99)
Glucose-Capillary: 110 mg/dL — ABNORMAL HIGH (ref 70–99)
Glucose-Capillary: 134 mg/dL — ABNORMAL HIGH (ref 70–99)
Glucose-Capillary: 119 mg/dL — ABNORMAL HIGH (ref 70–99)

## 2021-11-22 LAB — BASIC METABOLIC PANEL
Anion gap: 12 (ref 5–15)
BUN: 5 mg/dL — ABNORMAL LOW (ref 8–23)
CO2: 20 mmol/L — ABNORMAL LOW (ref 22–32)
Calcium: 7.4 mg/dL — ABNORMAL LOW (ref 8.9–10.3)
Chloride: 103 mmol/L (ref 98–111)
Creatinine, Ser: 0.65 mg/dL (ref 0.44–1.00)
GFR, Estimated: >60 mL/min (ref >60)
Glucose, Bld: 153 mg/dL — ABNORMAL HIGH (ref 70–99)
Potassium: 4.2 mmol/L (ref 3.5–5.1)
Sodium: 135 mmol/L (ref 135–145)

## 2021-11-22 LAB — CBC
HCT: 37.4 % (ref 36.0–46.0)
Hemoglobin: 11.6 g/dL — ABNORMAL LOW (ref 12.0–15.0)
MCH: 24.2 pg — ABNORMAL LOW (ref 26.0–34.0)
MCHC: 31 g/dL (ref 30.0–36.0)
MCV: 77.9 fL — ABNORMAL LOW (ref 80.0–100.0)
Platelets: 145 10*3/uL — ABNORMAL LOW (ref 150–400)
RBC: 4.8 MIL/uL (ref 3.87–5.11)
RDW: 15.4 % (ref 11.5–15.5)
WBC: 9.3 10*3/uL (ref 4.0–10.5)
nRBC: 0 % (ref 0.0–0.2)

## 2021-11-22 LAB — ECHOCARDIOGRAM COMPLETE
Area-P 1/2: 5.84 cm2
Calc EF: 45.4 %
Height: 62 in
S' Lateral: 3.7 cm
Single Plane A2C EF: 42.3 %
Single Plane A4C EF: 46.1 %
Weight: 2747.81 oz

## 2021-11-22 LAB — MRSA NEXT GEN BY PCR, NASAL: MRSA by PCR Next Gen: NOT DETECTED

## 2021-11-22 LAB — TRIGLYCERIDES: Triglycerides: 103 mg/dL (ref <150)

## 2021-11-22 LAB — URINE CULTURE: Culture: NO GROWTH

## 2021-11-22 LAB — MAGNESIUM: Magnesium: 1.1 mg/dL — ABNORMAL LOW (ref 1.7–2.4)

## 2021-11-22 LAB — HIV ANTIBODY (ROUTINE TESTING W REFLEX): HIV Screen 4th Generation wRfx: NONREACTIVE

## 2021-11-22 LAB — HEMOGLOBIN A1C
Hgb A1c MFr Bld: 6.9 % — ABNORMAL HIGH (ref 4.8–5.6)
Mean Plasma Glucose: 151.33 mg/dL

## 2021-11-22 MED ORDER — ACETAMINOPHEN 160 MG/5ML PO SOLN
650.0000 mg | ORAL | Status: DC | PRN
  Administered 2021-11-22: 650 mg
  Filled 2021-11-22: qty 20.3

## 2021-11-22 MED ORDER — PROSOURCE TF PO LIQD
45.0000 mL | Freq: Two times a day (BID) | ORAL | Status: DC
  Administered 2021-11-22 – 2021-11-23 (×3): 45 mL
  Filled 2021-11-22 (×4): qty 45

## 2021-11-22 MED ORDER — MAGNESIUM SULFATE 4 GM/100ML IV SOLN
4.0000 g | Freq: Once | INTRAVENOUS | Status: AC
  Administered 2021-11-22: 4 g via INTRAVENOUS
  Filled 2021-11-22: qty 100

## 2021-11-22 MED ORDER — VITAL 1.5 CAL PO LIQD
1000.0000 mL | ORAL | Status: DC
  Administered 2021-11-23: 1000 mL
  Filled 2021-11-22: qty 1000

## 2021-11-22 MED ORDER — LEVETIRACETAM 100 MG/ML PO SOLN
750.0000 mg | Freq: Two times a day (BID) | ORAL | Status: DC
  Administered 2021-11-22 – 2021-11-23 (×3): 750 mg
  Filled 2021-11-22 (×4): qty 10

## 2021-11-22 MED ORDER — DEXMEDETOMIDINE HCL IN NACL 400 MCG/100ML IV SOLN
0.4000 ug/kg/h | INTRAVENOUS | Status: DC
  Administered 2021-11-22: 0.4 ug/kg/h via INTRAVENOUS
  Administered 2021-11-23: 0.5 ug/kg/h via INTRAVENOUS
  Administered 2021-11-23: 0.4 ug/kg/h via INTRAVENOUS
  Filled 2021-11-22 (×3): qty 100

## 2021-11-22 NOTE — Progress Notes (Signed)
Bilateral lower extremity venous duplex has been completed. Preliminary results can be found in CV Proc through chart review.   11/22/21 1:10 PM [name] RVT

## 2021-11-22 NOTE — Progress Notes (Signed)
°  Echocardiogram 2D Echocardiogram has been performed.  [name] 11/22/2021, 2:04 PM

## 2021-11-22 NOTE — Progress Notes (Signed)
NAME:  [name], MRN:  [id number], DOB:  05-20-1956, LOS: 1 ADMISSION DATE:  11/21/2021, CONSULTATION DATE:  1/10 REFERRING MD:  Roslynn Amble, CHIEF COMPLAINT:  Sepsis   History of Present Illness:  66 y/o female admitted on 1/10 with cough, dyspnea and hypoxemic respiratory failure due to COVID 19 and flu pneumonia.  Intubated, central line placed.   Pertinent  Medical History  CHF > diastolic 0000000 echo Hypertension Stroke Seizure disorder Resides in a nursing home  Significant Hospital Events: Including procedures, antibiotic start and stop dates in addition to other pertinent events   1/10 admission  Micro 1/10 flu/covid positive >  1/10 blood > negative 1/10 urine >   Abx 1/10 vanc x1 1/10 cefepime x1 1/10 tamiflu >  1/10 remdesivir > 1/11  Imaging: 1/10 CT angiogram chest > no PE, atelectasis LLL, no infiltrate, personally reviewed  Interim History / Subjective:  Remains mechanically ventilated Intubated yesterday Blood pressure improved Fever improved Significant other says she had a seizure and vomiting the night before the seizure at the SNF and she was unresponsive after Was started on oxygen at the SNF on 1/11  Objective   Blood pressure 109/70, pulse 94, temperature 97.7 F (36.5 C), resp. rate 18, height 5\' 2"  (1.575 m), weight 77.9 kg, SpO2 99 %.    Vent Mode: PRVC FiO2 (%):  [40 %-100 %] 40 % Set Rate:  [18 bmp] 18 bmp Vt Set:  [400 mL] 400 mL PEEP:  [5 cmH20] 5 cmH20 Plateau Pressure:  [12 cmH20-22 cmH20] 16 cmH20   Intake/Output Summary (Last 24 hours) at 11/22/2021 0756 Last data filed at 11/22/2021 0500 Gross per 24 hour  Intake 4122.87 ml  Output 1400 ml  Net 2722.87 ml   Filed Weights   11/21/21 1706 11/22/21 0000 11/22/21 0500  Weight: 65.8 kg 77.4 kg 77.9 kg    Examination: General:  In bed on vent HENT: NCAT ETT in place PULM: CTA B, vent supported breathing CV: RRR, no mgr GI: BS+, soft, nontender MSK: normal bulk and  tone Derm: significant swelling in hands, feet Neuro: sedated on vent   Resolved Hospital Problem list     Assessment & Plan:  Sepsis due to viral pneumonia (COVID and Flu - A): improved Possible aspiration pneumonia KVO fluids as volume overloaded Repeat lactic acid Stop vanc/cefepime/remdesivir Continue tamiflu Continue decadron Follow up cultures Monitor CXR prn  Acute respiratory failure with hypoxemia Full mechanical vent support VAP prevention Daily WUA/SBT > SBT 1/11 AM  Anasarca: KVO fluids Consider lasix Monitor BMET and UOP Replace electrolytes as needed  Acute metabolic encephalopathy 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: unable to live independently so has been in a SNF > 1 year History of seizure Restart keppra Minimize sedation PAD protocol, RASS goal 0 Frequent neuro exams, if no improvement EEG today PT consult after extubation  DM2 with hyperglycemia SSI q4h  Best Practice (right click and "Reselect all SmartList Selections" daily)   Diet/type: NPO DVT prophylaxis: prophylactic heparin  GI prophylaxis: PPI Lines: N/A Foley:  N/A Code Status:  full code Last date of multidisciplinary goals of care discussion [I called Suezanne Jacquet, her significant other who holds healthcare power of attorney.]  Labs   CBC: Recent Labs  Lab 11/21/21 1850 11/21/21 1926 11/22/21 0508  WBC 6.4  --  9.3  NEUTROABS 5.2  --   --   HGB 12.3 12.9 11.6*  HCT 38.8 38.0 37.4  MCV 76.5*  --  77.9*  PLT 173  --  145*    Basic Metabolic Panel: Recent Labs  Lab 11/21/21 1850 11/21/21 1926 11/22/21 0508  NA 141 141 135  K 3.1* 2.8* 4.2  CL 101  --  103  CO2 24  --  20*  GLUCOSE 114*  --  153*  BUN 8  --  5*  CREATININE 0.77  --  0.65  CALCIUM 8.1*  --  7.4*  MG  --   --  1.1*   GFR: Estimated Creatinine Clearance: 68.6 mL/min (by C-G formula based on SCr of 0.65 mg/dL). Recent Labs  Lab 11/21/21 1850 11/21/21 1908 11/22/21 0000  11/22/21 0508  WBC 6.4  --   --  9.3  LATICACIDVEN 2.4* 3.0* 3.7*  --     Liver Function Tests: Recent Labs  Lab 11/21/21 1850  AST 21  ALT 9  ALKPHOS 50  BILITOT 0.4  PROT 6.1*  ALBUMIN 3.1*   No results for input(s): LIPASE, AMYLASE in the last 168 hours. No results for input(s): AMMONIA in the last 168 hours.  ABG    Component Value Date/Time   PHART 7.436 11/21/2021 1926   PCO2ART 41.4 11/21/2021 1926   PO2ART 390 (H) 11/21/2021 1926   HCO3 27.9 11/21/2021 1926   TCO2 29 11/21/2021 1926   O2SAT 100.0 11/21/2021 1926     Coagulation Profile: Recent Labs  Lab 11/21/21 1850  INR 1.2    Cardiac Enzymes: No results for input(s): CKTOTAL, CKMB, CKMBINDEX, TROPONINI in the last 168 hours.  HbA1C: Hgb A1c MFr Bld  Date/Time Value Ref Range Status  11/22/2021 05:08 AM 6.9 (H) 4.8 - 5.6 % Final    Comment:    (NOTE) Pre diabetes:          5.7%-6.4%  Diabetes:              >6.4%  Glycemic control for   <7.0% adults with diabetes   02/04/2019 05:10 AM 5.8 (H) 4.8 - 5.6 % Final    Comment:    (NOTE) Pre diabetes:          5.7%-6.4% Diabetes:              >6.4% Glycemic control for   <7.0% adults with diabetes     CBG: Recent Labs  Lab 11/21/21 2035 11/21/21 2210 11/21/21 2303 11/22/21 0332 11/22/21 0744  GLUCAP 107* 104* 97 145* 142*       Critical care time: 35 minutes    Roselie Awkward, MD [place] [facility] Pager: [phone] Cell: [phone] After 7:00 pm call Elink  [phone]

## 2021-11-22 NOTE — Progress Notes (Signed)
[place] Progress Note Patient Name: [name] DOB: 06-27-1956 MRN: [id number]   Date of Service  11/22/2021  HPI/Events of Note  Notified of increasing lactic acid to 3.7 from 3 from 2.4 On LR at 150 cc/hr with good urine output. Not on pressors. O2 100% on FiO2 40%  eICU Interventions  No change in intervention at this time as no clinical sign of hypoperfusion Discussed with bedside RN     Intervention Category Intermediate Interventions: Other:  Judd Lien 11/22/2021, 4:49 AM

## 2021-11-22 NOTE — Progress Notes (Signed)
PSV wean attempted. Pt is not breathing over. Pt placed back on full vent support.

## 2021-11-22 NOTE — ED Provider Notes (Signed)
.  Critical Care Performed by: Milagros Loll, MD Authorized by: Milagros Loll, MD   Critical care provider statement:    Critical care time (minutes):  78   Critical care was necessary to treat or prevent imminent or life-threatening deterioration of the following conditions:  Respiratory failure, sepsis, shock and circulatory failure   Critical care was time spent personally by me on the following activities:  Development of treatment plan with patient or surrogate, discussions with consultants, evaluation of patient's response to treatment, examination of patient, ordering and review of laboratory studies, ordering and review of radiographic studies, ordering and performing treatments and interventions, pulse oximetry, re-evaluation of patient's condition and review of old charts .Central Line  Date/Time: 11/22/2021 12:21 AM Performed by: Milagros Loll, MD Authorized by: Milagros Loll, MD   Consent:    Consent obtained:  Emergent situation   Consent given by:  Patient   Risks, benefits, and alternatives were discussed: yes     Risks discussed:  Arterial puncture, bleeding, infection, incorrect placement and nerve damage   Alternatives discussed:  No treatment, delayed treatment, referral and alternative treatment Universal protocol:    Immediately prior to procedure, a time out was called: yes     Patient identity confirmed:  Verbally with patient, arm band, provided demographic data, anonymous protocol, patient vented/unresponsive and hospital-assigned identification number Pre-procedure details:    Indication(s): central venous access and insufficient peripheral access     Hand hygiene: Hand hygiene performed prior to insertion     Sterile barrier technique: All elements of maximal sterile technique followed     Skin preparation:  Alcohol   Skin preparation agent: Skin preparation agent completely dried prior to procedure   Sedation:    Sedation type:   None Anesthesia:    Anesthesia method:  Local infiltration   Local anesthetic:  Lidocaine 1% w/o epi Procedure details:    Location:  R femoral   Site selection rationale:  Patient obtunded, not able to comply with instructions for safe neck line placement   Patient position:  Supine   Procedural supplies:  Triple lumen   Catheter size:  7 Fr   Landmarks identified: yes     Ultrasound guidance: yes     Ultrasound guidance timing: real time     Sterile ultrasound techniques: Sterile gel and sterile probe covers were used     Number of attempts:  1   Successful placement: yes   Post-procedure details:    Post-procedure:  Dressing applied and line sutured   Assessment:  Blood return through all ports   Procedure completion:  Tolerated well, no immediate complications    Milagros Loll, MD 11/22/21 0022

## 2021-11-22 NOTE — Procedures (Signed)
Patient Name: ARLEY GARANT  MRN: [id number]  Epilepsy Attending: Charlsie Quest  Referring Physician/Provider: Lupita Leash, MD Date: 11/22/2021 Duration: 27.13 mins  Patient history: 66 year old female with history of right MCA, seizures now with altered mental status.  EEG evaluate for seizures.  Level of alertness: Awake  AEDs during EEG study: LEV, propofol  Technical aspects: This EEG study was done with scalp electrodes positioned according to the 10-20 International system of electrode placement. Electrical activity was acquired at a sampling rate of 500Hz  and reviewed with a high frequency filter of 70Hz  and a low frequency filter of 1Hz . EEG data were recorded continuously and digitally stored.   Description: The posterior dominant rhythm consists of 8 Hz activity of moderate voltage (25-35 uV) seen predominantly in posterior head regions, asymmetric ( right<left) and reactive to eye opening and eye closing. EEG showed continuous 3 to 6 Hz theta-delta slowing in right hemisphere. There is also intermittent generalized 3-5hz  theta-delta slowing. Hyperventilation and photic stimulation were not performed.     ABNORMALITY - Continuous slow, right hemisphere - Intermittent slow, generalized  IMPRESSION: This study is suggestive of cortical dysfunction arising from right hemisphere likely secondary to underlying stroke. Additionally there is mild to moderate diffuse encephalopathy, nonspecific etiology. No seizures or definite epileptiform discharges were seen throughout the recording.   Kenichi Cassada 

## 2021-11-22 NOTE — Progress Notes (Signed)
EEG complete - results pending 

## 2021-11-22 NOTE — Progress Notes (Signed)
Initial Nutrition Assessment  DOCUMENTATION CODES:   Not applicable  INTERVENTION:   Initiate tube feeds via OG tube: - Start Vital 1.5 @ 20 ml/hr and advance by 10 ml q 4 hours to goal rate of 45 ml/hr (1080 ml/day) - ProSource TF 45 ml BID  Tube feeding regimen at goal provides 1700 kcal, 95 grams of protein, and 825 ml of H2O.   NUTRITION DIAGNOSIS:   Increased nutrient needs related to acute illness (sepsis secondary to influenza A and COVID-19 pneumonia) as evidenced by estimated needs.  GOAL:   Patient will meet greater than or equal to 90% of their needs  MONITOR:   Vent status, Labs, Weight trends, TF tolerance  REASON FOR ASSESSMENT:   Ventilator, Consult Enteral/tube feeding initiation and management  ASSESSMENT:   66 year old female who presented to the ED on 1/10 with fever, cough, AMS. Pt required intubation in the ED. PMH of CHF, HTN, stroke, T2DM, PE, schizophrenia, bipolar disorder. Pt admitted with sepsis secondary to viral pneumonia from influenza A and COVID-19.  Discussed pt with RN and during ICU rounds. Consult received for tube feeding initiation and management. Pt with OG tube tip in distal stomach.  Unable to obtain diet and weight history at this time. Reviewed weight history in chart. Current weight of 77.9 kg is up from last available weight of 65.8 kg on 04/11/19. Pt with mild pitting generalized edema.  Admit weight: 77.4 kg Current weight: 77.9 kg  Patient is currently intubated on ventilator support MV: 7.2 L/min Temp (24hrs), Avg:100 F (37.8 C), Min:97.5 F (36.4 C), Max:103.3 F (39.6 C)  Drips: Propofol: 7.9 ml/hr (weaning to off) Precedex  Medications reviewed and include: IV decadron, SSI q 4 hours, IV protonix, miralax  Labs reviewed: ionized calcium 1.09, magnesium 1.1, lactic acid 3.0, hemoglobin A1C 6.9 CBG's: 97-145 x 24 hours  UOP: 1400 ml x 12 hours I/O's: +3.0 L since admit  NUTRITION - FOCUSED PHYSICAL  EXAM:  Unable to complete at this time. Pt receiving care at time of RD visit.  Diet Order:   Diet Order             Diet NPO time specified  Diet effective now                   EDUCATION NEEDS:   Not appropriate for education at this time  Skin:  Skin Assessment: Reviewed RN Assessment  Last BM:  no documented BM  Height:   Ht Readings from Last 1 Encounters:  11/21/21 5\' 2"  (1.575 m)    Weight:   Wt Readings from Last 1 Encounters:  11/22/21 77.9 kg    BMI:  Body mass index is 31.41 kg/m.  Estimated Nutritional Needs:   Kcal:  1600-1800  Protein:  95-115 grams  Fluid:  >/= 1.8 L    01/20/22, MS, RD, LDN Inpatient Clinical Dietitian Please see AMiON for contact information.

## 2021-11-23 DIAGNOSIS — U071 COVID-19: Secondary | ICD-10-CM | POA: Diagnosis not present

## 2021-11-23 DIAGNOSIS — A4189 Other specified sepsis: Secondary | ICD-10-CM | POA: Diagnosis not present

## 2021-11-23 LAB — COMPREHENSIVE METABOLIC PANEL
ALT: 13 U/L (ref 0–44)
AST: 35 U/L (ref 15–41)
Albumin: 3.1 g/dL — ABNORMAL LOW (ref 3.5–5.0)
Alkaline Phosphatase: 54 U/L (ref 38–126)
Anion gap: 10 (ref 5–15)
BUN: 8 mg/dL (ref 8–23)
CO2: 25 mmol/L (ref 22–32)
Calcium: 7.9 mg/dL — ABNORMAL LOW (ref 8.9–10.3)
Chloride: 107 mmol/L (ref 98–111)
Creatinine, Ser: 0.65 mg/dL (ref 0.44–1.00)
GFR, Estimated: >60 mL/min (ref >60)
Glucose, Bld: 179 mg/dL — ABNORMAL HIGH (ref 70–99)
Potassium: 3.3 mmol/L — ABNORMAL LOW (ref 3.5–5.1)
Sodium: 142 mmol/L (ref 135–145)
Total Bilirubin: 0.4 mg/dL (ref 0.3–1.2)
Total Protein: 6.3 g/dL — ABNORMAL LOW (ref 6.5–8.1)

## 2021-11-23 LAB — GLUCOSE, CAPILLARY
Glucose-Capillary: 103 mg/dL — ABNORMAL HIGH (ref 70–99)
Glucose-Capillary: 154 mg/dL — ABNORMAL HIGH (ref 70–99)
Glucose-Capillary: 162 mg/dL — ABNORMAL HIGH (ref 70–99)
Glucose-Capillary: 172 mg/dL — ABNORMAL HIGH (ref 70–99)
Glucose-Capillary: 179 mg/dL — ABNORMAL HIGH (ref 70–99)
Glucose-Capillary: 180 mg/dL — ABNORMAL HIGH (ref 70–99)
Glucose-Capillary: 217 mg/dL — ABNORMAL HIGH (ref 70–99)

## 2021-11-23 LAB — CBC WITH DIFFERENTIAL/PLATELET
Abs Immature Granulocytes: 0.01 10*3/uL (ref 0.00–0.07)
Basophils Absolute: 0 10*3/uL (ref 0.0–0.1)
Basophils Relative: 0 %
Eosinophils Absolute: 0 10*3/uL (ref 0.0–0.5)
Eosinophils Relative: 0 %
HCT: 40.3 % (ref 36.0–46.0)
Hemoglobin: 12.9 g/dL (ref 12.0–15.0)
Immature Granulocytes: 0 %
Lymphocytes Relative: 12 %
Lymphs Abs: 0.5 10*3/uL — ABNORMAL LOW (ref 0.7–4.0)
MCH: 24.2 pg — ABNORMAL LOW (ref 26.0–34.0)
MCHC: 32 g/dL (ref 30.0–36.0)
MCV: 75.6 fL — ABNORMAL LOW (ref 80.0–100.0)
Monocytes Absolute: 0.2 10*3/uL (ref 0.1–1.0)
Monocytes Relative: 4 %
Neutro Abs: 3.6 10*3/uL (ref 1.7–7.7)
Neutrophils Relative %: 84 %
Platelets: 153 10*3/uL (ref 150–400)
RBC: 5.33 MIL/uL — ABNORMAL HIGH (ref 3.87–5.11)
RDW: 15.2 % (ref 11.5–15.5)
WBC: 4.3 10*3/uL (ref 4.0–10.5)
nRBC: 0 % (ref 0.0–0.2)

## 2021-11-23 LAB — PHOSPHORUS: Phosphorus: 2.3 mg/dL — ABNORMAL LOW (ref 2.5–4.6)

## 2021-11-23 LAB — MAGNESIUM: Magnesium: 2.3 mg/dL (ref 1.7–2.4)

## 2021-11-23 MED ORDER — POTASSIUM CHLORIDE 20 MEQ PO PACK
20.0000 meq | PACK | Freq: Once | ORAL | Status: AC
  Administered 2021-11-23: 20 meq
  Filled 2021-11-23: qty 1

## 2021-11-23 MED ORDER — OSELTAMIVIR PHOSPHATE 75 MG PO CAPS
75.0000 mg | ORAL_CAPSULE | Freq: Two times a day (BID) | ORAL | Status: AC
  Administered 2021-11-24 – 2021-11-26 (×5): 75 mg via ORAL
  Filled 2021-11-23 (×5): qty 1

## 2021-11-23 MED ORDER — DOCUSATE SODIUM 100 MG PO CAPS: 100.0000 mg | ORAL_CAPSULE | Freq: Two times a day (BID) | ORAL | Status: DC | PRN

## 2021-11-23 MED ORDER — POTASSIUM CHLORIDE 10 MEQ/100ML IV SOLN
10.0000 meq | INTRAVENOUS | Status: AC
  Administered 2021-11-23 (×2): 10 meq via INTRAVENOUS
  Filled 2021-11-23 (×2): qty 100

## 2021-11-23 MED ORDER — POLYETHYLENE GLYCOL 3350 17 G PO PACK
17.0000 g | PACK | Freq: Every day | ORAL | Status: DC | PRN
Start: 2021-11-23 — End: 2021-11-24

## 2021-11-23 MED ORDER — LEVETIRACETAM 750 MG PO TABS
750.0000 mg | ORAL_TABLET | Freq: Two times a day (BID) | ORAL | Status: DC
  Administered 2021-11-23 – 2021-11-26 (×6): 750 mg via ORAL
  Filled 2021-11-23 (×7): qty 1

## 2021-11-23 MED ORDER — LEVETIRACETAM 750 MG PO TABS
750.0000 mg | ORAL_TABLET | Freq: Two times a day (BID) | ORAL | Status: DC
  Filled 2021-11-23: qty 1

## 2021-11-23 MED ORDER — FUROSEMIDE 10 MG/ML IJ SOLN
40.0000 mg | Freq: Four times a day (QID) | INTRAMUSCULAR | Status: AC
  Administered 2021-11-23 (×2): 40 mg via INTRAVENOUS
  Filled 2021-11-23 (×2): qty 4

## 2021-11-23 MED ORDER — POTASSIUM PHOSPHATES 15 MMOLE/5ML IV SOLN
15.0000 mmol | Freq: Once | INTRAVENOUS | Status: AC
  Administered 2021-11-23: 15 mmol via INTRAVENOUS
  Filled 2021-11-23: qty 5

## 2021-11-23 NOTE — Progress Notes (Signed)
NAME:  [name], MRN:  [id number], DOB:  Oct 15, 1956, LOS: 2 ADMISSION DATE:  11/21/2021, CONSULTATION DATE:  1/10 REFERRING MD:  Stevie Kern, CHIEF COMPLAINT:  Sepsis   History of Present Illness:  66 y/o female admitted on 1/10 with cough, dyspnea and hypoxemic respiratory failure due to COVID 19 and flu pneumonia.  Intubated, central line placed.   Pertinent  Medical History  CHF > diastolic 01/2019 echo Hypertension Stroke Seizure disorder Resides in a nursing home  Significant Hospital Events: Including procedures, antibiotic start and stop dates in addition to other pertinent events   1/10 admission 1/11 eeg> old stroke on R, no seizures  Micro 1/10 flu/covid positive >  1/10 blood > negative 1/10 urine >   Abx 1/10 vanc x1 1/10 cefepime x1 1/10 tamiflu >  1/10 remdesivir > 1/11  Imaging: 1/10 CT angiogram chest > no PE, atelectasis LLL, no infiltrate, personally reviewed  Interim History / Subjective:   Switched to precedex Passing SBT this morning Diuresed yesterday  Objective   Blood pressure (!) 141/78, pulse 73, temperature (!) 96.3 F (35.7 C), resp. rate 18, height 5\' 2"  (1.575 m), weight 77.9 kg, SpO2 99 %.    Vent Mode: PSV;CPAP FiO2 (%):  [40 %] 40 % Set Rate:  [18 bmp] 18 bmp Vt Set:  [400 mL] 400 mL PEEP:  [5 cmH20] 5 cmH20 Pressure Support:  [10 cmH20] 10 cmH20 Plateau Pressure:  [17 cmH20-19 cmH20] 19 cmH20   Intake/Output Summary (Last 24 hours) at 11/23/2021 1128 Last data filed at 11/23/2021 01/21/2022 Gross per 24 hour  Intake 190.31 ml  Output 2350 ml  Net -2159.69 ml   Filed Weights   11/21/21 1706 11/22/21 0000 11/22/21 0500  Weight: 65.8 kg 77.4 kg 77.9 kg    Examination:  General:  In bed on vent HENT: NCAT ETT in place PULM: CTA B, vent supported breathing CV: RRR, no mgr GI: BS+, soft, nontender MSK: normal bulk and tone Neuro: awake, makes purposeful movements, periodically nods head and follows  commands     Resolved Hospital Problem list     Assessment & Plan:  Sepsis due to viral pneumonia (COVID and Flu - A): resolved Monitor hemodynamics   Acute respiratory failure with hypoxemia Full mechanical vent support VAP prevention Daily WUA/SBT Pressure support this morning, hopefully extubation  Anasarca: Lasix today x2 Monitor BMET and UOP Replace electrolytes as needed  Acute metabolic encephalopathy 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: unable to live independently so has been in a SNF > 1 year History of seizure Keppra Minimize sedation RASS target 0 Precedex per PAD  DM2 with hyperglycemia SSI q4h  Best Practice (right click and "Reselect all SmartList Selections" daily)   Diet/type: NPO DVT prophylaxis: prophylactic heparin  GI prophylaxis: PPI Lines: N/A Foley:  N/A Code Status:  full code Last date of multidisciplinary goals of care discussion [I called 01/20/22, her significant other who holds healthcare power of attorney on 1/11]  Labs   CBC: Recent Labs  Lab 11/21/21 1850 11/21/21 1926 11/22/21 0508 11/23/21 0430  WBC 6.4  --  9.3 4.3  NEUTROABS 5.2  --   --  3.6  HGB 12.3 12.9 11.6* 12.9  HCT 38.8 38.0 37.4 40.3  MCV 76.5*  --  77.9* 75.6*  PLT 173  --  145* 153    Basic Metabolic Panel: Recent Labs  Lab 11/21/21 1850 11/21/21 1926 11/22/21 0508 11/23/21 0430  NA 141 141 135 142  K 3.1* 2.8* 4.2 3.3*  CL 101  --  103 107  CO2 24  --  20* 25  GLUCOSE 114*  --  153* 179*  BUN 8  --  5* 8  CREATININE 0.77  --  0.65 0.65  CALCIUM 8.1*  --  7.4* 7.9*  MG  --   --  1.1* 2.3  PHOS  --   --   --  2.3*   GFR: Estimated Creatinine Clearance: 68.6 mL/min (by C-G formula based on SCr of 0.65 mg/dL). Recent Labs  Lab 11/21/21 1850 11/21/21 1908 11/22/21 0000 11/22/21 0508 11/22/21 1150 11/23/21 0430  WBC 6.4  --   --  9.3  --  4.3  LATICACIDVEN 2.4* 3.0* 3.7*  --  3.0*  --     Liver Function  Tests: Recent Labs  Lab 11/21/21 1850 11/23/21 0430  AST 21 35  ALT 9 13  ALKPHOS 50 54  BILITOT 0.4 0.4  PROT 6.1* 6.3*  ALBUMIN 3.1* 3.1*   No results for input(s): LIPASE, AMYLASE in the last 168 hours. No results for input(s): AMMONIA in the last 168 hours.  ABG    Component Value Date/Time   PHART 7.436 11/21/2021 1926   PCO2ART 41.4 11/21/2021 1926   PO2ART 390 (H) 11/21/2021 1926   HCO3 27.9 11/21/2021 1926   TCO2 29 11/21/2021 1926   O2SAT 100.0 11/21/2021 1926     Coagulation Profile: Recent Labs  Lab 11/21/21 1850  INR 1.2    Cardiac Enzymes: No results for input(s): CKTOTAL, CKMB, CKMBINDEX, TROPONINI in the last 168 hours.  HbA1C: Hgb A1c MFr Bld  Date/Time Value Ref Range Status  11/22/2021 05:08 AM 6.9 (H) 4.8 - 5.6 % Final    Comment:    (NOTE) Pre diabetes:          5.7%-6.4%  Diabetes:              >6.4%  Glycemic control for   <7.0% adults with diabetes   02/04/2019 05:10 AM 5.8 (H) 4.8 - 5.6 % Final    Comment:    (NOTE) Pre diabetes:          5.7%-6.4% Diabetes:              >6.4% Glycemic control for   <7.0% adults with diabetes     CBG: Recent Labs  Lab 11/22/21 1543 11/22/21 2120 11/23/21 0037 11/23/21 0437 11/23/21 0854  GLUCAP 134* 119* 154* 172* 180*       Critical care time: 31 minutes    Heber Safford, MD [facility] Pager: [phone] Cell: [phone] After 7:00 pm call Elink  [phone]

## 2021-11-23 NOTE — Procedures (Signed)
Extubation Procedure Note  Patient Details:   Name: [name] DOB: [birth year] MRN: [id number]   Airway Documentation:    Vent end date: 11/23/21 Vent end time: 1424   Evaluation  O2 sats: stable throughout Complications: No apparent complications Patient did tolerate procedure well. Bilateral Breath Sounds: Clear, Diminished   No, pt could not speak but could follow commands.  Pt extubated to 4 l/m [name]  per physician's order.  [name] 11/23/2021, 2:24 PM

## 2021-11-24 DIAGNOSIS — U071 COVID-19: Secondary | ICD-10-CM | POA: Diagnosis not present

## 2021-11-24 DIAGNOSIS — A4189 Other specified sepsis: Secondary | ICD-10-CM | POA: Diagnosis not present

## 2021-11-24 LAB — COMPREHENSIVE METABOLIC PANEL
ALT: 12 U/L (ref 0–44)
AST: 33 U/L (ref 15–41)
Albumin: 3.2 g/dL — ABNORMAL LOW (ref 3.5–5.0)
Alkaline Phosphatase: 48 U/L (ref 38–126)
Anion gap: 8 (ref 5–15)
BUN: 11 mg/dL (ref 8–23)
CO2: 28 mmol/L (ref 22–32)
Calcium: 7.8 mg/dL — ABNORMAL LOW (ref 8.9–10.3)
Chloride: 107 mmol/L (ref 98–111)
Creatinine, Ser: 0.68 mg/dL (ref 0.44–1.00)
GFR, Estimated: >60 mL/min (ref >60)
Glucose, Bld: 186 mg/dL — ABNORMAL HIGH (ref 70–99)
Potassium: 3.7 mmol/L (ref 3.5–5.1)
Sodium: 143 mmol/L (ref 135–145)
Total Bilirubin: 0.4 mg/dL (ref 0.3–1.2)
Total Protein: 6.3 g/dL — ABNORMAL LOW (ref 6.5–8.1)

## 2021-11-24 LAB — GLUCOSE, CAPILLARY
Glucose-Capillary: 183 mg/dL — ABNORMAL HIGH (ref 70–99)
Glucose-Capillary: 147 mg/dL — ABNORMAL HIGH (ref 70–99)
Glucose-Capillary: 114 mg/dL — ABNORMAL HIGH (ref 70–99)
Glucose-Capillary: 106 mg/dL — ABNORMAL HIGH (ref 70–99)
Glucose-Capillary: 128 mg/dL — ABNORMAL HIGH (ref 70–99)
Glucose-Capillary: 144 mg/dL — ABNORMAL HIGH (ref 70–99)

## 2021-11-24 MED ORDER — ENSURE ENLIVE PO LIQD
237.0000 mL | Freq: Two times a day (BID) | ORAL | Status: DC
  Administered 2021-11-24 – 2021-11-26 (×5): 237 mL via ORAL

## 2021-11-24 MED ORDER — PANTOPRAZOLE SODIUM 40 MG PO TBEC
40.0000 mg | DELAYED_RELEASE_TABLET | Freq: Two times a day (BID) | ORAL | Status: DC
  Administered 2021-11-24 – 2021-11-26 (×5): 40 mg via ORAL
  Filled 2021-11-24 (×5): qty 1

## 2021-11-24 MED ORDER — FUROSEMIDE 10 MG/ML IJ SOLN
40.0000 mg | Freq: Once | INTRAMUSCULAR | Status: AC
  Administered 2021-11-24: 40 mg via INTRAVENOUS
  Filled 2021-11-24: qty 4

## 2021-11-24 MED ORDER — ADULT MULTIVITAMIN W/MINERALS CH
1.0000 | ORAL_TABLET | Freq: Every day | ORAL | Status: DC
  Administered 2021-11-24 – 2021-11-26 (×3): 1 via ORAL
  Filled 2021-11-24 (×2): qty 1

## 2021-11-24 MED ORDER — RISPERIDONE 0.5 MG PO TABS
0.5000 mg | ORAL_TABLET | Freq: Every day | ORAL | Status: DC
  Administered 2021-11-24 – 2021-11-25 (×2): 0.5 mg via ORAL
  Filled 2021-11-24 (×2): qty 1

## 2021-11-24 MED ORDER — SODIUM CHLORIDE 0.9% FLUSH: 10.0000 mL | INTRAVENOUS | Status: DC | PRN

## 2021-11-24 MED ORDER — POTASSIUM CHLORIDE CRYS ER 20 MEQ PO TBCR
40.0000 meq | EXTENDED_RELEASE_TABLET | Freq: Once | ORAL | Status: AC
Start: 2021-11-24 — End: 2021-11-24
  Administered 2021-11-24: 40 meq via ORAL
  Filled 2021-11-24: qty 2

## 2021-11-24 MED ORDER — ASPIRIN EC 81 MG PO TBEC
81.0000 mg | DELAYED_RELEASE_TABLET | Freq: Every day | ORAL | Status: DC
  Administered 2021-11-24 – 2021-11-26 (×3): 81 mg via ORAL
  Filled 2021-11-24 (×3): qty 1

## 2021-11-24 MED ORDER — SODIUM CHLORIDE 0.9% FLUSH
10.0000 mL | Freq: Two times a day (BID) | INTRAVENOUS | Status: DC
  Administered 2021-11-24 – 2021-11-25 (×4): 10 mL

## 2021-11-24 MED ORDER — AMLODIPINE BESYLATE 5 MG PO TABS
5.0000 mg | ORAL_TABLET | Freq: Every day | ORAL | Status: DC
  Administered 2021-11-24 – 2021-11-26 (×3): 5 mg via ORAL
  Filled 2021-11-24 (×3): qty 1

## 2021-11-24 MED ORDER — RISPERIDONE 0.5 MG PO TABS
0.2500 mg | ORAL_TABLET | Freq: Every day | ORAL | Status: DC
  Administered 2021-11-24 – 2021-11-26 (×3): 0.25 mg via ORAL
  Filled 2021-11-24 (×3): qty 1

## 2021-11-24 MED ORDER — INSULIN ASPART 100 UNIT/ML IJ SOLN
0.0000 [IU] | Freq: Three times a day (TID) | INTRAMUSCULAR | Status: DC
  Administered 2021-11-25: 3 [IU] via SUBCUTANEOUS
  Administered 2021-11-25 – 2021-11-26 (×3): 2 [IU] via SUBCUTANEOUS

## 2021-11-24 MED ORDER — BUPROPION HCL ER (XL) 150 MG PO TB24
150.0000 mg | ORAL_TABLET | Freq: Every day | ORAL | Status: DC
  Administered 2021-11-24 – 2021-11-26 (×3): 150 mg via ORAL
  Filled 2021-11-24 (×3): qty 1

## 2021-11-24 MED ORDER — PREGABALIN 50 MG PO CAPS
75.0000 mg | ORAL_CAPSULE | Freq: Two times a day (BID) | ORAL | Status: DC
  Administered 2021-11-24 – 2021-11-26 (×5): 75 mg via ORAL
  Filled 2021-11-24 (×5): qty 1

## 2021-11-24 MED ORDER — METOPROLOL TARTRATE 25 MG PO TABS
25.0000 mg | ORAL_TABLET | Freq: Two times a day (BID) | ORAL | Status: DC
  Administered 2021-11-24 – 2021-11-26 (×5): 25 mg via ORAL
  Filled 2021-11-24 (×5): qty 1

## 2021-11-24 NOTE — Progress Notes (Signed)
° °  NAME:  [name], MRN:  [id number], DOB:  11-01-1956, LOS: 3 ADMISSION DATE:  11/21/2021, CONSULTATION DATE:  1/10 REFERRING MD:  Stevie Kern, CHIEF COMPLAINT:  Sepsis   History of Present Illness:  66 y/o female admitted on 1/10 with cough, dyspnea and hypoxemic respiratory failure due to COVID 19 and flu pneumonia.  Intubated, central line placed.   Pertinent  Medical History  CHF > diastolic 01/2019 echo, Hypertension Stroke Seizure disorder Resides in a nursing home  Significant Hospital Events: Including procedures, antibiotic start and stop dates in addition to other pertinent events   1/10 admission flu/covid positive > vanc x1 cefepime x1, 1/10 tamiflu > intubated. CVL placed. Started on Remdesivir. 1/10 CT angiogram chest > no PE, atelectasis LLL, no infiltrate, 1/11 eeg> old stroke on R, no seizures Remdesivir stopped.  1/12 extubated. Diuresed.  1/13 ready for transfer to medsurg     Interim History / Subjective:   No distress.   Objective   Blood pressure 134/66, pulse 75, temperature 98.2 F (36.8 C), temperature source Oral, resp. rate 16, height 5\' 2"  (1.575 m), weight 77.9 kg, SpO2 100 %.    Vent Mode: PSV;CPAP FiO2 (%):  [36 %-40 %] 36 % Pressure Support:  [5 cmH20-10 cmH20] 5 cmH20   Intake/Output Summary (Last 24 hours) at 11/24/2021 1022 Last data filed at 11/24/2021 0640 Gross per 24 hour  Intake 374.49 ml  Output 1140 ml  Net -765.51 ml   Filed Weights   11/21/21 1706 11/22/21 0000 11/22/21 0500  Weight: 65.8 kg 77.4 kg 77.9 kg    Examination: General chronically ill appearing female. Resting in bed. No distress HENT NCAT no JVD Neuro. Awake. Slow to respond. Affect flat. Voice quality soft. Oriented X 4. Has chronic left sided hemiparesis Pulm dec bases. No accessory use. 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   3/11 ACNP-BC [facility] Pulmonary/Critical Care Pager # [phone] OR # [phone] if no answer

## 2021-11-24 NOTE — Progress Notes (Signed)
Nutrition Follow-up  DOCUMENTATION CODES:   Not applicable  INTERVENTION:   - Ensure Enlive po BID, each supplement provides 350 kcal and 20 grams of protein  - MVI with minerals daily  - Encourage PO intake  NUTRITION DIAGNOSIS:   Increased nutrient needs related to acute illness (sepsis secondary to influenza A and COVID-19 pneumonia) as evidenced by estimated needs.  Ongoing, being addressed via diet advancement and oral nutrition supplements  GOAL:   Patient will meet greater than or equal to 90% of their needs  Progressing  MONITOR:   PO intake, Supplement acceptance, Labs, Weight trends  REASON FOR ASSESSMENT:   Ventilator, Consult Enteral/tube feeding initiation and management  ASSESSMENT:   66 year old female who presented to the ED on 1/10 with fever, cough, AMS. Pt required intubation in the ED. PMH of CHF, HTN, stroke, T2DM, PE, schizophrenia, bipolar disorder. Pt admitted with sepsis secondary to viral pneumonia from influenza A and COVID-19.  01/12 - extubated 01/13 - diet advanced to Carb Modified  Discussed pt with RN and during ICU rounds. Plan is for pt to transfer out of the ICU today if bed available. Pt's diet was just advanced to Carb Modified earlier today. Will order oral nutrition supplements to aid pt in meeting kcal and protein needs. Will also order daily MVI with minerals.  Admit weight: 77.4 kg Current weight: 77.9 kg  Pt with mild pitting generalized edema and deep pitting edema to LLE.  Medications reviewed and include: IV decadron, IV lasix 40 mg once, SSI, protonix, klor-con 40 mEq once  Labs reviewed: phosphorus 2.3 on 1/12 CBG's: 103-217 x 24 hours  UOP: 1290 ml x 24 hours I/O's: +291 ml since admit  Diet Order:   Diet Order             Diet Carb Modified Fluid consistency: Thin; Room service appropriate? Yes  Diet effective now                   EDUCATION NEEDS:   No education needs have been identified at  this time  Skin:  Skin Assessment: Reviewed RN Assessment  Last BM:  no documented BM  Height:   Ht Readings from Last 1 Encounters:  11/21/21 5\' 2"  (1.575 m)    Weight:   Wt Readings from Last 1 Encounters:  11/22/21 77.9 kg    BMI:  Body mass index is 31.41 kg/m.  Estimated Nutritional Needs:   Kcal:  1800-2000  Protein:  90-110 grams  Fluid:  >/= 1.8 L    01/20/22, MS, RD, LDN Inpatient Clinical Dietitian Please see AMiON for contact information.

## 2021-11-24 NOTE — Evaluation (Signed)
Physical Therapy Evaluation Patient Details Name: [name] MRN: [id number] DOB: 1956/06/23 Today's Date: 11/24/2021  History of Present Illness  Pt is a 66 y/o female admitted from SNF who presented with fever, AMS and sepsis secondary to viral PNA. Pt found to be COVID+ and with acute hypoxemic respiratory failure. PMH:  right MCA, DM2, HTN, chronic diastolic heart failure, hx PE in 2006, schizophrena/bipolar  Clinical Impression  Pt at baseline of being primarily bed bound at long term care facility. Will need mechanical lift for any OOB activity.        Recommendations for follow up therapy are one component of a multi-disciplinary discharge planning process, led by the attending physician.  Recommendations may be updated based on patient status, additional functional criteria and insurance authorization.  Follow Up Recommendations Long-term institutional care without follow-up therapy    Assistance Recommended at Discharge Frequent or constant Supervision/Assistance  Patient can return home with the following  Other (comment) (Pt bed bound long term care pt at facility)    Equipment Recommendations None recommended by PT  Recommendations for Other Services       Functional Status Assessment Patient has not had a recent decline in their functional status     Precautions / Restrictions Precautions Precautions: Fall;Other (comment) Precaution Comments: monitor O2 (does not wear at baseline); hx of L hemiplegia Restrictions Weight Bearing Restrictions: No      Mobility  Bed Mobility Overal bed mobility: Needs Assistance Bed Mobility: Supine to Sit;Sit to Supine     Supine to sit: +2 for physical assistance;+2 for safety/equipment;HOB elevated;Total assist Sit to supine: Total assist;+2 for safety/equipment;+2 for physical assistance   General bed mobility comments: Assist for all aspects    Transfers                   General transfer comment: Bedbound  at baseline. Mechanical lift for any OOB    Ambulation/Gait                  Stairs            Wheelchair Mobility    Modified Rankin (Stroke Patients Only)       Balance Overall balance assessment: Needs assistance Sitting-balance support: Feet supported;Single extremity supported Sitting balance-Leahy Scale: Poor Sitting balance - Comments: Sat EOB x 10 min with mod assist Postural control: Posterior lean                                   Pertinent Vitals/Pain Pain Assessment: Faces Faces Pain Scale: No hurt Pain Intervention(s): Monitored during session    Home Living Family/patient expects to be discharged to:: Skilled nursing facility                   Additional Comments: from LTC SNF    Prior Function Prior Level of Function : Needs assist       Physical Assist : Mobility (physical);ADLs (physical) Mobility (physical): Bed mobility;Transfers ADLs (physical): Bathing;Dressing;Toileting;Grooming Mobility Comments: reports not gettiing out of bed at SNF, not even to a wheelchair ADLs Comments: Assist with bathing, dressing bed level. able to self feed and perform simple grooming tasks with setup     Hand Dominance   Dominant Hand: Right    Extremity/Trunk Assessment   Upper Extremity Assessment Upper Extremity Assessment: Defer to OT evaluation LUE Deficits / Details: hx of L hemiplegia, spastic tone throughout wtih  catch and release. 2-5th digit extension WFL, thumb with decreased abduction, wrist PROM WFL, elbow approx 20* of stretch, shoulder flex to 75* passively LUE Sensation: decreased light touch LUE Coordination: decreased fine motor;decreased gross motor    Lower Extremity Assessment Lower Extremity Assessment: LLE deficits/detail LLE Deficits / Details: hx of hemiplegia. No active movement noted    Cervical / Trunk Assessment Cervical / Trunk Assessment: Kyphotic  Communication   Communication: No  difficulties  Cognition Arousal/Alertness: Awake/alert Behavior During Therapy: Flat affect Overall Cognitive Status: History of cognitive impairments - at baseline                                 General Comments: hx of mental health disorders, able to follow one step directions fairly well, able to give PLOF. would not answer orientation questions - RN reports pt unable to report year but answered all other questions appropriately earlier this AM        General Comments General comments (skin integrity, edema, etc.): When SpO2 with good pleth >90% on RA. Much of time poor pleth.    Exercises     Assessment/Plan    PT Assessment Patient does not need any further PT services  PT Problem List         PT Treatment Interventions      PT Goals (Current goals can be found in the Care Plan section)  Acute Rehab PT Goals PT Goal Formulation: All assessment and education complete, DC therapy    Frequency       Co-evaluation PT/OT/SLP Co-Evaluation/Treatment: Yes Reason for Co-Treatment: For patient/therapist safety PT goals addressed during session: Mobility/safety with mobility;Balance OT goals addressed during session: ADL's and self-care;Strengthening/ROM       AM-PAC PT "6 Clicks" Mobility  Outcome Measure Help needed turning from your back to your side while in a flat bed without using bedrails?: Total Help needed moving from lying on your back to sitting on the side of a flat bed without using bedrails?: Total Help needed moving to and from a bed to a chair (including a wheelchair)?: Total Help needed standing up from a chair using your arms (e.g., wheelchair or bedside chair)?: Total Help needed to walk in hospital room?: Total Help needed climbing 3-5 steps with a railing? : Total 6 Click Score: 6    End of Session Equipment Utilized During Treatment: Oxygen Activity Tolerance: Patient limited by fatigue Patient left: in bed;with call bell/phone  within reach;with bed alarm set Nurse Communication: Mobility status;Need for lift equipment PT Visit Diagnosis: Other abnormalities of gait and mobility (R26.89)    Time: 5916-3846 PT Time Calculation (min) (ACUTE ONLY): 20 min   Charges:   PT Evaluation $PT Eval Moderate Complexity: 1 Mod          [facility] PT Acute Rehabilitation Services Pager [phone] Office [phone]   Angelina Ok [facility] 11/24/2021, 2:48 PM

## 2021-11-24 NOTE — Evaluation (Signed)
Occupational Therapy Evaluation/Discharge Patient Details Name: [name] MRN: [id number] DOB: 12-21-1955 Today's Date: 11/24/2021   History of Present Illness Pt is a 66 y/o female admitted from SNF who presented with fever, AMS and sepsis secondary to viral PNA. Pt found to be COVID+ and with acute hypoxemic respiratory failure. PMH:  right MCA, DM2, HTN, chronic diastolic heart failure, hx PE in 2006, schizophrena/bipolar   Clinical Impression   PTA, pt is a SNF resident and is bedbound at baseline. Pt reports able to self feed and complete basic grooming tasks with setup but requires extensive assist for all other ADLs bed level. Pt presents now at functional baseline with Max A for UB and Total A for LB ADL completion. Pt able to answer majority of questions appropriately and follows directions consistently. Pt requires Total A x 2 to sit EOB, significant assist to maintain static sitting balance, and would require a mechanical lift for any OOB transfers. SpO2 readings with poor pleth signal on RA though no overt respiratory distress noted with activities. As pt appears at baseline for ADLs/transfers , will sign off at acute level.      Recommendations for follow up therapy are one component of a multi-disciplinary discharge planning process, led by the attending physician.  Recommendations may be updated based on patient status, additional functional criteria and insurance authorization.   Follow Up Recommendations  Long-term institutional care without follow-up therapy    Assistance Recommended at Discharge Frequent or constant Supervision/Assistance  Patient can return home with the following A lot of help with bathing/dressing/bathroom    Functional Status Assessment  Patient has not had a recent decline in their functional status  Equipment Recommendations  None recommended by OT    Recommendations for Other Services       Precautions / Restrictions  Precautions Precautions: Fall;Other (comment) Precaution Comments: monitor O2 (does not wear at baseline); hx of L hemiplegia Restrictions Weight Bearing Restrictions: No      Mobility Bed Mobility Overal bed mobility: Needs Assistance Bed Mobility: Supine to Sit;Sit to Supine     Supine to sit: +2 for physical assistance;+2 for safety/equipment;HOB elevated;Total assist Sit to supine: Total assist;+2 for safety/equipment;+2 for physical assistance        Transfers                   General transfer comment: deferred, would need maximove, bedbound at baseline      Balance Overall balance assessment: Needs assistance Sitting-balance support: Feet supported;Single extremity supported Sitting balance-Leahy Scale: Poor Sitting balance - Comments: overall Mod A to maintain static sitting balance with posterior lean Postural control: Posterior lean                                 ADL either performed or assessed with clinical judgement   ADL Overall ADL's : Needs assistance/impaired Eating/Feeding: Set up;Bed level   Grooming: Set up;Sitting;Bed level;Wash/dry face Grooming Details (indicate cue type and reason): able to wash face with R UE well, assist to maintain balance sitting EOB Upper Body Bathing: Maximal assistance;Bed level   Lower Body Bathing: Total assistance;Bed level   Upper Body Dressing : Maximal assistance;Bed level   Lower Body Dressing: Total assistance;Bed level       Toileting- Clothing Manipulation and Hygiene: Total assistance;Bed level         General ADL Comments: Pt appears at baseline with ADLs, able to  assist with basic tasks but requires extensive assist for all other ADLs baseline. reports not wearing a splint for L UE contracture mgmt in a while though no overt skin integrity issues noted     Vision Ability to See in Adequate Light: 2 Moderately impaired Patient Visual Report: No change from baseline Vision  Assessment?: Vision impaired- to be further tested in functional context Additional Comments: likely baseline, impaired at midline focus, often rolls eyes prior to responding (does not appear behavioral)     Perception     Praxis      Pertinent Vitals/Pain Pain Assessment: Faces Faces Pain Scale: No hurt Pain Intervention(s): Monitored during session     Hand Dominance Right   Extremity/Trunk Assessment Upper Extremity Assessment Upper Extremity Assessment: LUE deficits/detail LUE Deficits / Details: hx of L hemiplegia, spastic tone throughout wtih catch and release. 2-5th digit extension WFL, thumb with decreased abduction, wrist PROM WFL, elbow approx 20* of stretch, shoulder flex to 75* passively LUE Sensation: decreased light touch LUE Coordination: decreased fine motor;decreased gross motor   Lower Extremity Assessment Lower Extremity Assessment: Defer to PT evaluation   Cervical / Trunk Assessment Cervical / Trunk Assessment: Kyphotic   Communication Communication Communication: No difficulties   Cognition Arousal/Alertness: Awake/alert Behavior During Therapy: Flat affect Overall Cognitive Status: History of cognitive impairments - at baseline                                 General Comments: hx of mental health disorders, able to follow one step directions fairly well, able to give PLOF. would not answer orientation questions - RN reports pt unable to report year but answered all other questions appropriately earlier this AM     General Comments  Poor pleth signal but no SOB/distress noted on RA. reapplied 2 L o2 at end of session.    Exercises     Shoulder Instructions      Home Living Family/patient expects to be discharged to:: Skilled nursing facility                                 Additional Comments: from LTC SNF      Prior Functioning/Environment Prior Level of Function : Needs assist       Physical Assist :  Mobility (physical);ADLs (physical) Mobility (physical): Bed mobility;Transfers ADLs (physical): Bathing;Dressing;Toileting;Grooming Mobility Comments: reports not gettiing out of bed at SNF, not even to a wheelchair ADLs Comments: Assist with bathing, dressing bed level. able to self feed and perform simple grooming tasks with setup        OT Problem List: Decreased range of motion;Decreased strength;Decreased cognition;Impaired UE functional use      OT Treatment/Interventions:      OT Goals(Current goals can be found in the care plan section) Acute Rehab OT Goals Patient Stated Goal: none stated OT Goal Formulation: All assessment and education complete, DC therapy  OT Frequency:      Co-evaluation PT/OT/SLP Co-Evaluation/Treatment: Yes Reason for Co-Treatment: For patient/therapist safety;To address functional/ADL transfers   OT goals addressed during session: ADL's and self-care;Strengthening/ROM      AM-PAC OT "6 Clicks" Daily Activity     Outcome Measure Help from another person eating meals?: A Little Help from another person taking care of personal grooming?: A Little Help from another person toileting, which includes using toliet, bedpan, or urinal?: Total  Help from another person bathing (including washing, rinsing, drying)?: A Lot Help from another person to put on and taking off regular upper body clothing?: A Lot Help from another person to put on and taking off regular lower body clothing?: Total 6 Click Score: 12   End of Session Equipment Utilized During Treatment: Oxygen Nurse Communication: Mobility status;Need for lift equipment  Activity Tolerance: Patient tolerated treatment well Patient left: in bed;with call bell/phone within reach;with bed alarm set  OT Visit Diagnosis: Other (comment) (decreased cardiopulmonary tolerance)                Time: [id number] OT Time Calculation (min): 22 min Charges:  OT General Charges $OT Visit: 1 Visit OT  Evaluation $OT Eval Moderate Complexity: 1 Mod  Bradd CanaryJulie B, OTR/L Acute Rehab Services Office: [phone]   Lorre MunroeJulie  Chanc Kervin 11/24/2021, 12:30 PM

## 2021-11-25 LAB — BASIC METABOLIC PANEL
Anion gap: 10 (ref 5–15)
BUN: 14 mg/dL (ref 8–23)
CO2: 26 mmol/L (ref 22–32)
Calcium: 8.6 mg/dL — ABNORMAL LOW (ref 8.9–10.3)
Chloride: 105 mmol/L (ref 98–111)
Creatinine, Ser: 0.64 mg/dL (ref 0.44–1.00)
GFR, Estimated: >60 mL/min (ref >60)
Glucose, Bld: 246 mg/dL — ABNORMAL HIGH (ref 70–99)
Potassium: 3.9 mmol/L (ref 3.5–5.1)
Sodium: 141 mmol/L (ref 135–145)

## 2021-11-25 LAB — GLUCOSE, CAPILLARY
Glucose-Capillary: 201 mg/dL — ABNORMAL HIGH (ref 70–99)
Glucose-Capillary: 185 mg/dL — ABNORMAL HIGH (ref 70–99)
Glucose-Capillary: 113 mg/dL — ABNORMAL HIGH (ref 70–99)
Glucose-Capillary: 156 mg/dL — ABNORMAL HIGH (ref 70–99)

## 2021-11-25 LAB — PROCALCITONIN: Procalcitonin: <0.1 ng/mL

## 2021-11-25 LAB — PHOSPHORUS: Phosphorus: 2.9 mg/dL (ref 2.5–4.6)

## 2021-11-25 MED ORDER — CHLORHEXIDINE GLUCONATE CLOTH 2 % EX PADS
6.0000 | MEDICATED_PAD | Freq: Every day | CUTANEOUS | Status: DC
  Administered 2021-11-25: 6 via TOPICAL

## 2021-11-25 MED ORDER — INSULIN GLARGINE-YFGN 100 UNIT/ML ~~LOC~~ SOLN
10.0000 [IU] | Freq: Every day | SUBCUTANEOUS | Status: DC
  Administered 2021-11-25 – 2021-11-26 (×2): 10 [IU] via SUBCUTANEOUS
  Filled 2021-11-25 (×2): qty 0.1

## 2021-11-25 NOTE — Progress Notes (Signed)
PROGRESS NOTE  [name]  DOB: [birth year]  PCP: Leeroy Cha, MD WCB:[id number]  DOA: 11/21/2021  LOS: 4 days  Hospital Day: 5  Chief Complaint  Patient presents with   Code Sepsis   Brief narrative: [name] is a 66 y.o. female with PMH significant for HTN, diastolic CHF, stroke, seizure disorder who lives in a nursing home. Patient sent to the ED on 11/21/2008 with cough, dyspnea, fever up to 103  Respiratory virus panel positive for COVID and influenza A. Lactic acid level elevated She was intubated in the ED, central line placed, admitted to ICU CT angio of chest negative for pulmonary embolism, showed partial consolidation in the left lung base suggestive of atelectasis versus pneumonia versus aspiration. She was treated with IV remdesivir, broad-spectrum antibiotics. Extubated on 1/12 Transferred to [facility] service on 1/14.  Subjective: Patient was seen and examined this morning.  Pleasant elderly Caucasian female.  Propped up in bed.  On 2 L oxygen by nasal cannula. Baseline left hemiplegia due to previous stroke No family at bedside  Assessment/Plan: Sepsis- POA Acute respiratory failure with hypoxia -Sepsis and respiratory failure secondary to COVID and influenza. -Intubated on admission, extubated in 48 hours. -Currently on 2 L oxygen by nasal cannula.  COVID pneumonia -Presented with cough, dyspnea -COVID test: PCR positive in the ED -Chest imaging: CT chest with partial consolidation in the left lung base -Treatment: Was initially started on remdesivir but it seems it was stopped on the second day.  Currently on a 10-day course of steroids.  Protonix while on steroids. -Supportive care with antitussives -WBC and inflammatory markers trend as below.  -Obtain procalcitonin level, repeat lactic acid level. Recent Labs  Lab 11/21/21 1708 11/21/21 1850 11/21/21 1908 11/22/21 0000 11/22/21 0508 11/22/21 1150 11/23/21 0430 11/24/21 0420   SARSCOV2NAA POSITIVE*  --   --   --   --   --   --   --   WBC  --  6.4  --   --  9.3  --  4.3  --   LATICACIDVEN  --  2.4* 3.0* 3.7*  --  3.0*  --   --   ALT  --  9  --   --   --   --  13 12   Influenza A - to complete 5-day course of Tamiflu on 1/15.  Type 2 diabetes mellitus -A1c 6.9 on 1/11 -Home meds include Lantus 37 units daily, sliding scale insulin, metformin 500 mg twice daily -Currently patient is on steroids but blood sugar level seems consistently less than 200 on sliding scale insulin only.  Seems to be trending up this morning, will initiate Lantus 10 units from this morning. Recent Labs  Lab 11/24/21 1205 11/24/21 1530 11/24/21 1925 11/24/21 2313 11/25/21 0750  GLUCAP 114* 106* 128* 144* 616*   Mild systolic CHF Essential hypertension -Presented with anasarca.  Given few doses of IV Lasix. -Echo 1/11 with EF 45 to 50%, global hypokinesis -Acute global hypokinesis probably related to acute illness.  Needs repeat echo in next few weeks as an outpatient. -Currently blood pressure is controlled on metoprolol 25 mg twice daily, amlodipine 5 mg daily,  History of stroke -Continue aspirin 81 mg daily.  Resume statin.  Seizure disorder -Keppra 750 mg twice daily  Anxiety/depression -Prior meds include wellbutrin 150 mg daily, risperidone 0.25 mg daily and 0.5 mg at bedtime, Lyrica 75 mg twice daily.  Currently all of them seem to be on hold  Mobility: Long-term nursing home patient Living condition: Nursing home Goals of care:   Code Status: Full Code  Nutritional status: Body mass index is 31.41 kg/m.  Nutrition Problem: Increased nutrient needs Etiology: acute illness (sepsis secondary to influenza A and COVID-19 pneumonia) Signs/Symptoms: estimated needs Diet:  Diet Order             Diet Carb Modified Fluid consistency: Thin; Room service appropriate? Yes  Diet effective now                  DVT prophylaxis:  heparin injection 5,000 Units Start:  11/22/21 0600   Antimicrobials: Tamiflu Fluid: None Consultants: PCCM Family Communication: None at bedside  Status is: Inpatient  Continue in-hospital care because: Improving sepsis Level of care: Med-Surg   Dispo: The patient is from: Nursing home              Anticipated d/c is to: Back to nursing home in 1 to 2 days              Patient currently is not medically stable to d/c.   Difficult to place patient No     Infusions:    Scheduled Meds:  amLODipine  5 mg Oral Daily   aspirin EC  81 mg Oral Daily   buPROPion  150 mg Oral Daily   chlorhexidine gluconate (MEDLINE KIT)  15 mL Mouth Rinse BID   Chlorhexidine Gluconate Cloth  6 each Topical Daily   dexamethasone (DECADRON) injection  6 mg Intravenous Q24H   feeding supplement  237 mL Oral BID BM   heparin  5,000 Units Subcutaneous Q8H   insulin aspart  0-9 Units Subcutaneous TID WC   insulin glargine-yfgn  10 Units Subcutaneous Daily   levETIRAcetam  750 mg Oral BID   metoprolol tartrate  25 mg Oral BID   multivitamin with minerals  1 tablet Oral Daily   oseltamivir  75 mg Oral BID   pantoprazole  40 mg Oral BID   pregabalin  75 mg Oral BID   risperiDONE  0.25 mg Oral Daily   risperiDONE  0.5 mg Oral QHS   sodium chloride flush  10-40 mL Intracatheter Q12H    PRN meds: acetaminophen (TYLENOL) oral liquid 160 mg/5 mL, sodium chloride flush   Antimicrobials: Anti-infectives (From admission, onward)    Start     Dose/Rate Route Frequency Ordered Stop   11/24/21 1000  oseltamivir (TAMIFLU) capsule 75 mg        75 mg Oral 2 times daily 11/23/21 2139 11/26/21 2159   11/22/21 2000  vancomycin (VANCOREADY) IVPB 1250 mg/250 mL  Status:  Discontinued        1,250 mg 166.7 mL/hr over 90 Minutes Intravenous Every 24 hours 11/21/21 2003 11/22/21 0936   11/22/21 1000  remdesivir 100 mg in sodium chloride 0.9 % 100 mL IVPB  Status:  Discontinued       See Hyperspace for full Linked Orders Report.   100 mg 200 mL/hr  over 30 Minutes Intravenous Daily 11/21/21 2234 11/22/21 0936   11/22/21 0300  ceFEPIme (MAXIPIME) 2 g in sodium chloride 0.9 % 100 mL IVPB  Status:  Discontinued        2 g 200 mL/hr over 30 Minutes Intravenous Every 8 hours 11/21/21 2003 11/22/21 0936   11/21/21 2245  remdesivir 200 mg in sodium chloride 0.9% 250 mL IVPB       See Hyperspace for full Linked Orders Report.   200 mg 580  mL/hr over 30 Minutes Intravenous Once 11/21/21 2234 11/22/21 0123   11/21/21 2200  oseltamivir (TAMIFLU) capsule 75 mg  Status:  Discontinued        75 mg Per Tube 2 times daily 11/21/21 2156 11/23/21 2139   11/21/21 1715  ceFEPIme (MAXIPIME) 2 g in sodium chloride 0.9 % 100 mL IVPB        2 g 200 mL/hr over 30 Minutes Intravenous  Once 11/21/21 1708 11/21/21 2006   11/21/21 1715  metroNIDAZOLE (FLAGYL) IVPB 500 mg        500 mg 100 mL/hr over 60 Minutes Intravenous  Once 11/21/21 1708 11/21/21 2028   11/21/21 1715  vancomycin (VANCOREADY) IVPB 1250 mg/250 mL        1,250 mg 166.7 mL/hr over 90 Minutes Intravenous  Once 11/21/21 1708 11/21/21 2100       Objective: Vitals:   11/25/21 0700 11/25/21 0750  BP: 131/74   Pulse: (!) 59   Resp: 14   Temp:  98.3 F (36.8 C)  SpO2: 100%     Intake/Output Summary (Last 24 hours) at 11/25/2021 1044 Last data filed at 11/25/2021 0553 Gross per 24 hour  Intake --  Output 1100 ml  Net -1100 ml   Filed Weights   11/21/21 1706 11/22/21 0000 11/22/21 0500  Weight: 65.8 kg 77.4 kg 77.9 kg   Weight change:  Body mass index is 31.41 kg/m.   Physical Exam: General exam: Pleasant elderly Caucasian female.  Propped up in bed.  Not in physical distress Skin: No rashes, lesions or ulcers. HEENT: Atraumatic, normocephalic, no obvious bleeding Lungs: Diminished air entry in both bases CVS: Regular rate and rhythm, no murmur GI/Abd soft, nontender, nondistended, bowel sound present CNS: Alert, awake, slow to respond but oriented to place and person.   Baseline left hemiplegia after previous stroke Psychiatry: Sad affect Extremities: No pedal edema, no calf tenderness  Data Review: I have personally reviewed the laboratory data and studies available.  F/u labs ordered Unresulted Labs (From admission, onward)     Start     Ordered   11/26/21 0500  Procalcitonin  Daily,   R     Question:  Specimen collection method  Answer:  Lab=Lab collect   11/25/21 0834   11/26/21 0500  CBC with Differential/Platelet  Tomorrow morning,   R       Question:  Specimen collection method  Answer:  Lab=Lab collect   11/25/21 0834   11/26/21 4370  Basic metabolic panel  Tomorrow morning,   R       Question:  Specimen collection method  Answer:  Lab=Lab collect   11/25/21 0834   11/26/21 0500  Lactic acid, plasma  Tomorrow morning,   R       Question:  Specimen collection method  Answer:  Lab=Lab collect   11/25/21 0834   11/25/21 0835  Procalcitonin - Baseline  Add-on,   AD       Question:  Specimen collection method  Answer:  Lab=Lab collect   11/25/21 0834            Signed, Terrilee Croak, MD [facility] 11/25/2021

## 2021-11-26 LAB — DIFFERENTIAL
Abs Immature Granulocytes: 0 10*3/uL (ref 0.00–0.07)
Basophils Absolute: 0 10*3/uL (ref 0.0–0.1)
Basophils Relative: 0 %
Eosinophils Absolute: 0 10*3/uL (ref 0.0–0.5)
Eosinophils Relative: 0 %
Lymphocytes Relative: 22 %
Lymphs Abs: 0.9 10*3/uL (ref 0.7–4.0)
Monocytes Absolute: 0.1 10*3/uL (ref 0.1–1.0)
Monocytes Relative: 3 %
Neutro Abs: 3.2 10*3/uL (ref 1.7–7.7)
Neutrophils Relative %: 75 %

## 2021-11-26 LAB — BASIC METABOLIC PANEL
Anion gap: 10 (ref 5–15)
BUN: 13 mg/dL (ref 8–23)
CO2: 25 mmol/L (ref 22–32)
Calcium: 8.5 mg/dL — ABNORMAL LOW (ref 8.9–10.3)
Chloride: 105 mmol/L (ref 98–111)
Creatinine, Ser: 0.63 mg/dL (ref 0.44–1.00)
GFR, Estimated: >60 mL/min (ref >60)
Glucose, Bld: 197 mg/dL — ABNORMAL HIGH (ref 70–99)
Potassium: 4.2 mmol/L (ref 3.5–5.1)
Sodium: 140 mmol/L (ref 135–145)

## 2021-11-26 LAB — CULTURE, BLOOD (ROUTINE X 2)
Culture: NO GROWTH
Culture: NO GROWTH
Special Requests: ADEQUATE
Special Requests: ADEQUATE

## 2021-11-26 LAB — GLUCOSE, CAPILLARY
Glucose-Capillary: 182 mg/dL — ABNORMAL HIGH (ref 70–99)
Glucose-Capillary: 198 mg/dL — ABNORMAL HIGH (ref 70–99)

## 2021-11-26 LAB — CBC
HCT: 35.6 % — ABNORMAL LOW (ref 36.0–46.0)
Hemoglobin: 11.5 g/dL — ABNORMAL LOW (ref 12.0–15.0)
MCH: 24.5 pg — ABNORMAL LOW (ref 26.0–34.0)
MCHC: 32.3 g/dL (ref 30.0–36.0)
MCV: 75.9 fL — ABNORMAL LOW (ref 80.0–100.0)
Platelets: 149 10*3/uL — ABNORMAL LOW (ref 150–400)
RBC: 4.69 MIL/uL (ref 3.87–5.11)
RDW: 15.4 % (ref 11.5–15.5)
WBC: 4.3 10*3/uL (ref 4.0–10.5)
nRBC: 0 % (ref 0.0–0.2)

## 2021-11-26 LAB — PROCALCITONIN: Procalcitonin: <0.1 ng/mL

## 2021-11-26 LAB — LACTIC ACID, PLASMA: Lactic Acid, Venous: 1 mmol/L (ref 0.5–1.9)

## 2021-11-26 MED ORDER — AMLODIPINE BESYLATE 5 MG PO TABS: 5.0000 mg | ORAL_TABLET | Freq: Every day | ORAL | Status: AC

## 2021-11-26 MED ORDER — ADULT MULTIVITAMIN W/MINERALS CH: 1.0000 | ORAL_TABLET | Freq: Every day | ORAL | Status: AC

## 2021-11-26 MED ORDER — ACETAMINOPHEN 160 MG/5ML PO SOLN: 650.0000 mg | ORAL | Status: DC | PRN

## 2021-11-26 MED ORDER — DEXAMETHASONE 6 MG PO TABS: 6.0000 mg | ORAL_TABLET | Freq: Every day | ORAL | 0 refills | Status: AC

## 2021-11-26 MED ORDER — LANTUS SOLOSTAR 100 UNIT/ML ~~LOC~~ SOPN: 20.0000 [IU] | PEN_INJECTOR | Freq: Every day | SUBCUTANEOUS | Status: AC

## 2021-11-26 MED ORDER — BUPROPION HCL ER (XL) 150 MG PO TB24: 150.0000 mg | ORAL_TABLET | Freq: Every day | ORAL | Status: AC | PRN

## 2021-11-26 NOTE — TOC Initial Note (Signed)
Transition of Care [facility]) - Initial/Assessment Note    Patient Details  Name: [name] MRN: [id number] Date of Birth: 18-Aug-1956  Transition of Care [facility]) CM/SW Contact:    Verna Czech [place], [place] Phone Number: [phone] 11/26/2021, 8:50 AM  Clinical Narrative:                 Patient from [facility] health Care (Long Term Care) with plan to return. Phone call to admissions director Hassel Neth, No covid test needed, however does need discharge summary before her return.  Transition of Care to continue to follow to coordinate discharge back to the long term care facility.  Makya Yurko, LCSW Transition of Care [phone]   Expected Discharge Plan: Long Term Nursing Home     Patient Goals and CMS Choice        Expected Discharge Plan and Services Expected Discharge Plan: Long Term Nursing Home       Living arrangements for the past 2 months: Skilled Nursing Facility                                      Prior Living Arrangements/Services Living arrangements for the past 2 months: Skilled Nursing Facility Lives with:: Facility Resident          Need for Family Participation in Patient Care: No (Comment) Care giver support system in place?: Yes (comment)   Criminal Activity/Legal Involvement Pertinent to Current Situation/Hospitalization: No - Comment as needed  Activities of Daily Living      Permission Sought/Granted                  Emotional Assessment         Alcohol / Substance Use: Not Applicable Psych Involvement: No (comment)  Admission diagnosis:  Altered mental status, unspecified altered mental status type [R41.82] Sepsis, due to unspecified organism, unspecified whether acute organ dysfunction present (HCC) [A41.9] Sepsis due to COVID-19 (HCC) [U07.1, A41.89] Patient Active Problem List   Diagnosis Date Noted   Altered mental status    Sepsis due to COVID-19 (HCC) 11/21/2021   Seizures (HCC)    FUO (fever of  unknown origin)    Hyponatremia    Spastic hemiparesis (HCC)    Vascular headache    Hypertensive crisis    Thrombocytopenia (HCC)    Leukopenia    Hypoalbuminemia due to protein-calorie malnutrition (HCC)    Hypokalemia    Prediabetes    Spastic hemiplegia affecting nondominant side (HCC)    Chronic diastolic congestive heart failure (HCC)    Substance abuse (HCC)    Essential hypertension    Labile blood pressure    Aneurysm, cerebral, nonruptured 02/06/2019   Acute right MCA stroke (HCC) 02/06/2019   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 [place] [address] Phone: [phone] Fax: [phone]     Social Determinants of Health (SDOH) Interventions    Readmission Risk Interventions No flowsheet data found.

## 2021-11-26 NOTE — Progress Notes (Signed)
RN called report to [facility] health. Receiving RN accepted report. SW to call transport.

## 2021-11-26 NOTE — TOC Transition Note (Signed)
Transition of Care [facility]) - CM/SW Discharge Note   Patient Details  Name: [name] MRN: [id number] Date of Birth: 24-Jan-1956  Transition of Care [facility]) CM/SW Contact:  Verna Czech [place], [place] Phone Number: [phone] 11/26/2021, 12:49 PM   Clinical Narrative:    Patient to discharge today to [facility]. Patient to be transported by [facility] and will be going in to room 214. RN to call in report to [phone].  Carleah Yablonski, LCSW Transition of Care [phone]    Final next level of care: Long Term Nursing Home     Patient Goals and CMS Choice        Discharge Placement                       Discharge Plan and Services                                     Social Determinants of Health (SDOH) Interventions     Readmission Risk Interventions No flowsheet data found.

## 2021-11-26 NOTE — Discharge Summary (Signed)
Physician Discharge Summary  TNIYAH SHIPLETT NLG:[id number] DOB: 06/08/1956 DOA: 11/21/2021  PCP: Lorenda Ishihara, MD  Admit date: 11/21/2021 Discharge date: 11/26/2021  Admitted From: Long-term nursing home Discharge disposition: Long-term nursing home   Code Status: Full Code   Discharge Diagnosis:   Principal Problem:   Sepsis due to COVID-19 [facility]) Active Problems:   Altered mental status    Chief Complaint  Patient presents with   Code Sepsis   Brief narrative: BRIEL SHANKMAN is a 66 y.o. female with PMH significant for HTN, diastolic CHF, stroke, seizure disorder who lives in a nursing home. Patient sent to the ED on 11/21/2008 with cough, dyspnea, fever up to 103  Respiratory virus panel positive for COVID and influenza A. Lactic acid level elevated She was intubated in the ED, central line placed, admitted to ICU CT angio of chest negative for pulmonary embolism, showed partial consolidation in the left lung base suggestive of atelectasis versus pneumonia versus aspiration. She was treated with IV remdesivir, broad-spectrum antibiotics. Extubated on 1/12 Transferred to [facility] service on 1/14.  Subjective: Patient was seen and examined this morning.   Pleasant elderly Caucasian female.  Propped up in bed.  On 2 L oxygen by nasal cannula. Baseline left hemiplegia due to previous stroke No change in last 24 hours. No family at bedside.  Hospital course: Sepsis- POA Acute respiratory failure with hypoxia -Sepsis and respiratory failure secondary to COVID and influenza. -Intubated on admission, extubated in 48 hours. -Currently on 2 L oxygen by nasal cannula.  COVID pneumonia -Presented with cough, dyspnea -COVID test: PCR positive in the ED -Chest imaging: CT chest with partial consolidation in the left lung base -Treatment: Was initially started on remdesivir but it seems it was stopped on the second day.  Currently on a 10-day course of steroids.   Continue Protonix. -Supportive care with antitussives -WBC and inflammatory markers trend as below.  -Normal procalcitonin level, lactic acid level normalized Recent Labs  Lab 11/21/21 1708 11/21/21 1850 11/21/21 1908 11/22/21 0000 11/22/21 0508 11/22/21 1150 11/23/21 0430 11/24/21 0420 11/25/21 0906 11/26/21 0255 11/26/21 0446  SARSCOV2NAA POSITIVE*  --   --   --   --   --   --   --   --   --   --   WBC  --  6.4  --   --  9.3  --  4.3  --   --   --  4.3  LATICACIDVEN  --  2.4* 3.0* 3.7*  --  3.0*  --   --   --  1.0  --   PROCALCITON  --   --   --   --   --   --   --   --  <0.10 <0.10  --   ALT  --  9  --   --   --   --  13 12  --   --   --    Influenza A -Completed 5-day course of Tamiflu on 1/15.  Type 2 diabetes mellitus -A1c 6.9 on 1/11 -Home meds include Lantus 37 units daily, sliding scale insulin, metformin 500 mg twice daily -Currently requiring less insulin despite being on steroids.   -Discharged on low-dose of insulin.  Resume metformin post discharge.  Continue sliding scale insulin. Recent Labs  Lab 11/25/21 0750 11/25/21 1202 11/25/21 1659 11/25/21 2226 11/26/21 0758  GLUCAP 201* 185* 113* 156* 182*   Mild systolic CHF Essential hypertension -Presented with anasarca.  Given few doses of IV Lasix. -Echo 1/11 with EF 45 to 50%, global hypokinesis -Acute global hypokinesis probably related to acute illness.  Needs repeat echo in next few weeks as an outpatient. -Currently blood pressure is controlled on metoprolol 25 mg twice daily, amlodipine 5 mg daily.  Resume Lasix post discharge  History of stroke -Continue aspirin 81 mg daily.  Resume statin.  Seizure disorder -Keppra 750 mg twice daily  Anxiety/depression -Prior meds include wellbutrin 150 mg daily, risperidone 0.25 mg daily and 0.5 mg at bedtime, Lyrica 75 mg twice daily.  Currently all of them seem to be on hold. -At discharge, I would resume them only as as needed.  Mobility: Long-term  nursing home patient Living condition: Nursing home Goals of care:   Code Status: Full Code  Nutritional status: Body mass index is 31.73 kg/m.  Nutrition Problem: Increased nutrient needs Etiology: acute illness (sepsis secondary to influenza A and COVID-19 pneumonia) Signs/Symptoms: estimated needs  Discharge Medications:   Allergies as of 11/26/2021   No Known Allergies      Medication List     STOP taking these medications    cefTRIAXone 1 g injection Commonly known as: ROCEPHIN   sertraline 25 MG tablet Commonly known as: ZOLOFT   tiZANidine 4 MG tablet Commonly known as: ZANAFLEX   traMADol 50 MG tablet Commonly known as: ULTRAM       TAKE these medications    acetaminophen 325 MG tablet Commonly known as: TYLENOL Take 2 tablets (650 mg total) by mouth every 4 (four) hours as needed for mild pain (or temp > 37.5 C (99.5 F)).   albuterol (2.5 MG/3ML) 0.083% nebulizer solution Commonly known as: PROVENTIL Take 2.5 mg by nebulization every 6 (six) hours as needed for wheezing or shortness of breath.   amLODipine 5 MG tablet Commonly known as: NORVASC Take 1 tablet (5 mg total) by mouth daily. Start taking on: November 27, 2021 What changed:  medication strength how much to take Another medication with the same name was removed. Continue taking this medication, and follow the directions you see here.   aspirin 81 MG chewable tablet Chew 81 mg by mouth daily. What changed: Another medication with the same name was removed. Continue taking this medication, and follow the directions you see here.   atorvastatin 40 MG tablet Commonly known as: LIPITOR Take 1 tablet (40 mg total) by mouth daily at 6 PM. What changed: when to take this   buPROPion 150 MG 24 hr tablet Commonly known as: WELLBUTRIN XL Take 1 tablet (150 mg total) by mouth daily as needed. What changed:  when to take this reasons to take this   dexamethasone 6 MG tablet Commonly known  as: Decadron Take 1 tablet (6 mg total) by mouth daily for 5 days.   furosemide 20 MG tablet Commonly known as: LASIX Take 20 mg by mouth daily.   insulin lispro 100 UNIT/ML injection Commonly known as: HUMALOG Inject 0-10 Units into the skin See admin instructions. Before meals and at bedtime per sliding scale 70-150 = 2 units 151-200 = 4 units 201-250 = 6 units 251-300 = 8 units 301-350 = 10 units >70 call md   Lantus SoloStar 100 UNIT/ML Solostar Pen Generic drug: insulin glargine Inject 20 Units into the skin daily. What changed:  medication strength how much to take   levETIRAcetam 750 MG tablet Commonly known as: KEPPRA Take 1 tablet (750 mg total) by mouth 2 (two) times daily.  melatonin 5 MG Tabs Take 5 mg by mouth at bedtime.   metFORMIN 500 MG tablet Commonly known as: GLUCOPHAGE Take 500 mg by mouth 2 (two) times daily with a meal.   metoprolol tartrate 25 MG tablet Commonly known as: LOPRESSOR Take 12.5 mg by mouth 2 (two) times daily.   multivitamin with minerals Tabs tablet Take 1 tablet by mouth daily. Start taking on: November 27, 2021   nicotine 14 mg/24hr patch Commonly known as: NICODERM CQ - dosed in mg/24 hours Place 14 mg onto the skin daily.   pantoprazole 40 MG tablet Commonly known as: PROTONIX Take 40 mg by mouth 2 (two) times daily.   polyethylene glycol powder 17 GM/SCOOP powder Commonly known as: GLYCOLAX/MIRALAX Take 17 g by mouth daily.   potassium chloride 10 MEQ tablet Commonly known as: KLOR-CON M Take 10 mEq by mouth daily.   pregabalin 75 MG capsule Commonly known as: LYRICA Take 1 capsule (75 mg total) by mouth daily. What changed: when to take this   risperiDONE 0.5 MG tablet Commonly known as: RISPERDAL Take 0.5 mg by mouth at bedtime. What changed: Another medication with the same name was removed. Continue taking this medication, and follow the directions you see here.   risperiDONE 0.25 MG tablet Commonly  known as: RISPERDAL Take 0.25 mg by mouth daily. What changed: Another medication with the same name was removed. Continue taking this medication, and follow the directions you see here.   senna-docusate 8.6-50 MG tablet Commonly known as: Senokot-S Take 1 tablet by mouth 2 (two) times daily.   tiotropium 18 MCG inhalation capsule Commonly known as: SPIRIVA Place 18 mcg into inhaler and inhale daily.   Vitamin D3 1.25 MG (50000 UT) Caps Take 50,000 Units by mouth every Monday.        Wound care:     Discharge Instructions:   Discharge Instructions     Call MD for:  difficulty breathing, headache or visual disturbances   Complete by: As directed    Call MD for:  extreme fatigue   Complete by: As directed    Call MD for:  hives   Complete by: As directed    Call MD for:  persistant dizziness or light-headedness   Complete by: As directed    Call MD for:  persistant nausea and vomiting   Complete by: As directed    Call MD for:  severe uncontrolled pain   Complete by: As directed    Call MD for:  temperature >100.4   Complete by: As directed    Diet general   Complete by: As directed    Discharge instructions   Complete by: As directed    Discharge instructions for diabetes mellitus: Check blood sugar 3 times a day and bedtime at home. If blood sugar running above 200 or less than 70 please call your MD to adjust insulin. If you notice signs and symptoms of hypoglycemia (low blood sugar) like jitteriness, confusion, thirst, tremor and sweating, please check blood sugar, drink sugary drink/biscuits/sweets to increase sugar level and call MD or return to ER.    Discharge instructions for CHF Check weight daily -preferably same time every day. Restrict fluid intake to 1200 ml daily Restrict salt intake to less than 2 g daily. Call MD if you have one of the following symptoms 1) 3 pound weight gain in 24 hours or 5 pounds in 1 week  2) swelling in the hands, feet or  stomach  3) progressive  shortness of breath 4) if you have to sleep on extra pillows at night in order to breathe     General discharge instructions:  Follow with Primary MD Leeroy Cha, MD in 7 days   Get CBC/BMP checked in next visit within 1 week by PCP or SNF MD. (We routinely change or add medications that can affect your baseline labs and fluid status, therefore we recommend that you get the mentioned basic workup next visit with your PCP, your PCP may decide not to get them or add new tests based on their clinical decision)  On your next visit with your PCP, please get your medicines reviewed and adjusted.  Please request your PCP  to go over all hospital tests, procedures, radiology results at the follow up, please get all Hospital records sent to your PCP by signing hospital release before you go home.  Activity: As tolerated with Full fall precautions use walker/cane & assistance as needed  Avoid using any recreational substances like cigarette, tobacco, alcohol, or non-prescribed drug.  If you experience worsening of your admission symptoms, develop shortness of breath, life threatening emergency, suicidal or homicidal thoughts you must seek medical attention immediately by calling 911 or calling your MD immediately  if symptoms less severe.  You must read complete instructions/literature along with all the possible adverse reactions/side effects for all the medicines you take and that have been prescribed to you. Take any new medicine only after you have completely understood and accepted all the possible adverse reactions/side effects.   Do not drive, operate heavy machinery, perform activities at heights, swimming or participation in water activities or provide baby sitting services if your were admitted for syncope or siezures until you have seen by Primary MD or a Neurologist and advised to do so again.  Do not drive when taking Pain medications.  Do not take  more than prescribed Pain, Sleep and Anxiety Medications  Wear Seat belts while driving.  Please note You were cared for by a hospitalist during your hospital stay. If you have any questions about your discharge medications or the care you received while you were in the hospital after you are discharged, you can call the unit and asked to speak with the hospitalist on call if the hospitalist that took care of you is not available. Once you are discharged, your primary care physician will handle any further medical issues. Please note that NO REFILLS for any discharge medications will be authorized once you are discharged, as it is imperative that you return to your primary care physician (or establish a relationship with a primary care physician if you do not have one) for your aftercare needs so that they can reassess your need for medications and monitor your lab values.   Increase activity slowly   Complete by: As directed        Follow ups:    Follow-up Information     Leeroy Cha, MD Follow up.   Specialty: Internal Medicine Contact information: [address] [place] [address] [phone]                 Discharge Exam:   Vitals:   11/26/21 0400 11/26/21 0500 11/26/21 0600 11/26/21 0758  BP:      Pulse: (!) 51 (!) 55 (!) 54   Resp: 12 10 11    Temp:    98.2 F (36.8 C)  TempSrc:    Oral  SpO2: 97% 98% 98%   Weight:  78.7 kg    Height:        Body mass index is 31.73 kg/m.  General exam: Pleasant elderly Caucasian female.  Propped up in bed.  Not in physical distress Skin: No rashes, lesions or ulcers. HEENT: Atraumatic, normocephalic, no obvious bleeding Lungs: Diminished air entry in both bases CVS: Regular rate and rhythm, no murmur GI/Abd soft, nontender, nondistended, bowel sound present CNS: Alert, awake, slow to respond but oriented to place and person.  Baseline left hemiplegia after previous stroke Psychiatry: Sad  affect Extremities: No pedal edema, no calf tenderness  Time coordinating discharge: 35 minutes   The results of significant diagnostics from this hospitalization (including imaging, microbiology, ancillary and laboratory) are listed below for reference.    Procedures and Diagnostic Studies:   DG Abd 1 View  Result Date: 11/22/2021 CLINICAL DATA:  Enteric tube placement. EXAM: ABDOMEN - 1 VIEW COMPARISON:  Abdominal radiograph dated 09/13/2008. FINDINGS: Enteric tube with tip in the distal stomach. No bowel dilatation in the visualized abdomen. Left lung base atelectasis or infiltrate. IMPRESSION: Enteric tube with tip in the distal stomach. Electronically Signed   By: Anner Crete M.D.   On: 11/22/2021 02:07   CT Head Wo Contrast  Result Date: 11/21/2021 CLINICAL DATA:  Delirium. EXAM: CT HEAD WITHOUT CONTRAST TECHNIQUE: Contiguous axial images were obtained from the base of the skull through the vertex without intravenous contrast. RADIATION DOSE REDUCTION: This exam was performed according to the departmental dose-optimization program which includes automated exposure control, adjustment of the mA and/or kV according to patient size and/or use of iterative reconstruction technique. COMPARISON:  Head CT dated 03/05/2019. FINDINGS: Brain: Sequela prior right MCA and PCA infarcts and encephalomalacia with severe ex vacuo dilatation of the right lateral ventricle similar to prior CT. There is moderate age-related atrophy and chronic microvascular ischemic changes. There is no acute intracranial hemorrhage. No midline shift. No extra-axial fluid collection. Vascular: No hyperdense vessel or unexpected calcification. Skull: Normal. Negative for fracture or focal lesion. Sinuses/Orbits: Diffuse mucoperiosteal thickening of paranasal sinuses. The mastoid air cells are clear. Other: None IMPRESSION: 1. No acute intracranial pathology. 2. Moderate age-related atrophy and chronic microvascular ischemic  changes. Sequela prior right MCA and PCA infarcts and dilatation of right lateral ventricle. Electronically Signed   By: Anner Crete M.D.   On: 11/21/2021 23:06   CT Angio Chest Pulmonary Embolism (PE) W or WO Contrast  Result Date: 11/21/2021 CLINICAL DATA:  Hypoxia EXAM: CT ANGIOGRAPHY CHEST WITH CONTRAST TECHNIQUE: Multidetector CT imaging of the chest was performed using the standard protocol during bolus administration of intravenous contrast. Multiplanar CT image reconstructions and MIPs were obtained to evaluate the vascular anatomy. RADIATION DOSE REDUCTION: This exam was performed according to the departmental dose-optimization program which includes automated exposure control, adjustment of the mA and/or kV according to patient size and/or use of iterative reconstruction technique. CONTRAST:  80mL OMNIPAQUE IOHEXOL 350 MG/ML SOLN COMPARISON:  Chest x-ray 11/21/2021, CT chest 09/03/2005 FINDINGS: Cardiovascular: Satisfactory opacification of the pulmonary arteries to the segmental level. No evidence of pulmonary embolism. Aorta is nonaneurysmal. Mild atherosclerosis. The heart is slightly enlarged. No pericardial effusion Mediastinum/Nodes: Midline trachea with endotracheal tube, the tip is about 1.2 cm superior to carina. Borderline mediastinal lymph nodes, AP window lymph nodes measuring up to 11 mm. Esophagus within normal limits. Lungs/Pleura: Partial consolidation in the left lung base. No pleural effusion or pneumothorax Upper Abdomen: No acute abnormality. Musculoskeletal: No acute osseous abnormality. Review of the MIP  images confirms the above findings. IMPRESSION: 1. Negative for acute pulmonary embolus. 2. Partial consolidation in the left lung base may be due to atelectasis, pneumonia, or aspiration Aortic Atherosclerosis (ICD10-I70.0). Electronically Signed   By: Donavan Foil M.D.   On: 11/21/2021 23:09   DG Chest Port 1 View  Result Date: 11/22/2021 CLINICAL DATA:  Hypoxia.   Pneumonia. EXAM: PORTABLE CHEST 1 VIEW COMPARISON:  One-view chest x-ray and CTA chest 11/21/2021 FINDINGS: Heart is enlarged. Endotracheal tube is stable in position. Gastric tube extends off the inferior border the film. Lung volumes are low. Asymmetric left lower lobe airspace disease is unchanged. IMPRESSION: 1. Stable cardiomegaly without failure. 2. Stable asymmetric left lower lobe airspace disease. 3. Stable endotracheal tube. Electronically Signed   By: San Morelle M.D.   On: 11/22/2021 07:59   DG Chest Portable 1 View  Result Date: 11/21/2021 CLINICAL DATA:  Cough and fever EXAM: PORTABLE CHEST 1 VIEW COMPARISON:  11/21/2021, CT 09/03/2025 FINDINGS: Interval intubation, tip of the endotracheal tube is about 2.3 cm superior to carina. Cardiomegaly. Patchy airspace disease at left base. No pleural effusion or pneumothorax. IMPRESSION: 1. Endotracheal tube tip about 2.3 cm superior to carina. 2. Suspicion of patchy airspace disease at left base which may be due to atelectasis or pneumonia 3. Cardiomegaly Electronically Signed   By: Donavan Foil M.D.   On: 11/21/2021 19:20   DG Chest Port 1 View  Result Date: 11/21/2021 CLINICAL DATA:  Questionable sepsis. EXAM: PORTABLE CHEST 1 VIEW COMPARISON:  Chest x-ray 02/16/2019. FINDINGS: The heart size and mediastinal contours are within normal limits. Both lungs are clear. The visualized skeletal structures are unremarkable. IMPRESSION: No active disease. Electronically Signed   By: Ronney Asters M.D.   On: 11/21/2021 18:06   EEG adult  Result Date: 11/22/2021 Lora Havens, MD     11/22/2021  3:39 PM Patient Name: ERMALINDA ELIZABETH MRN: [id number] Epilepsy Attending: Lora Havens Referring Physician/Provider: Juanito Doom, MD Date: 11/22/2021 Duration: 27.13 mins Patient history: 66 year old female with history of right MCA, seizures now with altered mental status.  EEG evaluate for seizures. Level of alertness: Awake AEDs during EEG  study: LEV, propofol Technical aspects: This EEG study was done with scalp electrodes positioned according to the 10-20 International system of electrode placement. Electrical activity was acquired at a sampling rate of 500Hz  and reviewed with a high frequency filter of 70Hz  and a low frequency filter of 1Hz . EEG data were recorded continuously and digitally stored. Description: The posterior dominant rhythm consists of 8 Hz activity of moderate voltage (25-35 uV) seen predominantly in posterior head regions, asymmetric ( right<left) and reactive to eye opening and eye closing. EEG showed continuous 3 to 6 Hz theta-delta slowing in right hemisphere. There is also intermittent generalized 3-5hz  theta-delta slowing. Hyperventilation and photic stimulation were not performed.   ABNORMALITY - Continuous slow, right hemisphere - Intermittent slow, generalized IMPRESSION: This study is suggestive of cortical dysfunction arising from right hemisphere likely secondary to underlying stroke. Additionally there is mild to moderate diffuse encephalopathy, nonspecific etiology. No seizures or definite epileptiform discharges were seen throughout the recording. Lora Havens   ECHOCARDIOGRAM COMPLETE  Result Date: 11/22/2021    ECHOCARDIOGRAM REPORT   Patient Name:   ALYNNE BRADSHAW Date of Exam: 11/22/2021 Medical Rec #:  [id number]      Height:       62.0 in Accession #:    [id number]     Weight:  171.7 lb Date of Birth:  09/10/1956     BSA:          1.792 m Patient Age:    66 years       BP:           106/64 mmHg Patient Gender: F              HR:           105 bpm. Exam Location:  Inpatient Procedure: 2D Echo, Cardiac Doppler and Color Doppler Indications:    Acutre respiratory distress R06.03  History:        Patient has prior history of Echocardiogram examinations, most                 recent 02/04/2019. CHF, Stroke; Risk Factors:Hypertension.  Sonographer:    Bernadene Person RDCS Referring Phys: [place] Comments: Echo performed with patient supine and on artificial respirator. IMPRESSIONS  1. Left ventricular ejection fraction, by estimation, is 45 to 50%. The left ventricle has mildly decreased function. The left ventricle demonstrates global hypokinesis. Indeterminate diastolic filling due to E-A fusion. Elevated left ventricular end-diastolic pressure.  2. Right ventricular systolic function is normal. The right ventricular size is normal. Tricuspid regurgitation signal is inadequate for assessing PA pressure.  3. The mitral valve is normal in structure. No evidence of mitral valve regurgitation. No evidence of mitral stenosis.  4. The aortic valve is normal in structure. Aortic valve regurgitation is not visualized. No aortic stenosis is present. FINDINGS  Left Ventricle: Left ventricular ejection fraction, by estimation, is 45 to 50%. The left ventricle has mildly decreased function. The left ventricle demonstrates global hypokinesis. The left ventricular internal cavity size was normal in size. There is  no left ventricular hypertrophy. Indeterminate diastolic filling due to E-A fusion. Elevated left ventricular end-diastolic pressure. Right Ventricle: The right ventricular size is normal. No increase in right ventricular wall thickness. Right ventricular systolic function is normal. Tricuspid regurgitation signal is inadequate for assessing PA pressure. Left Atrium: Left atrial size was normal in size. Right Atrium: Right atrial size was normal in size. Pericardium: There is no evidence of pericardial effusion. Mitral Valve: The mitral valve is normal in structure. No evidence of mitral valve regurgitation. No evidence of mitral valve stenosis. Tricuspid Valve: The tricuspid valve is normal in structure. Tricuspid valve regurgitation is not demonstrated. No evidence of tricuspid stenosis. Aortic Valve: The aortic valve is normal in structure. Aortic valve regurgitation is not visualized.  No aortic stenosis is present. Pulmonic Valve: The pulmonic valve was normal in structure. Pulmonic valve regurgitation is not visualized. No evidence of pulmonic stenosis. Aorta: The aortic root is normal in size and structure. Venous: The inferior vena cava was not well visualized. IAS/Shunts: No atrial level shunt detected by color flow Doppler.  LEFT VENTRICLE PLAX 2D LVIDd:         4.90 cm      Diastology LVIDs:         3.70 cm      LV e' medial:    6.66 cm/s LV PW:         1.00 cm      LV E/e' medial:  20.3 LV IVS:        1.00 cm      LV e' lateral:   8.41 cm/s LVOT diam:     1.90 cm      LV E/e' lateral: 16.1 LV SV:  47 LV SV Index:   26 LVOT Area:     2.84 cm  LV Volumes (MOD) LV vol d, MOD A2C: 86.7 ml LV vol d, MOD A4C: 104.0 ml LV vol s, MOD A2C: 50.0 ml LV vol s, MOD A4C: 56.1 ml LV SV MOD A2C:     36.7 ml LV SV MOD A4C:     104.0 ml LV SV MOD BP:      44.7 ml RIGHT VENTRICLE RV S prime:     16.30 cm/s TAPSE (M-mode): 1.6 cm LEFT ATRIUM             Index        RIGHT ATRIUM          Index LA diam:        2.70 cm 1.51 cm/m   RA Area:     9.26 cm LA Vol (A2C):   26.7 ml 14.90 ml/m  RA Volume:   15.20 ml 8.48 ml/m LA Vol (A4C):   27.5 ml 15.35 ml/m LA Biplane Vol: 29.3 ml 16.35 ml/m  AORTIC VALVE LVOT Vmax:   105.00 cm/s LVOT Vmean:  66.500 cm/s LVOT VTI:    0.167 m  AORTA Ao Root diam: 2.80 cm Ao Asc diam:  3.40 cm MITRAL VALVE MV Area (PHT): 5.84 cm     SHUNTS MV Decel Time: 130 msec     Systemic VTI:  0.17 m MV E velocity: 135.00 cm/s  Systemic Diam: 1.90 cm MV A velocity: 54.00 cm/s MV E/A ratio:  2.50 Fransico Him MD Electronically signed by Fransico Him MD Signature Date/Time: 11/22/2021/2:32:55 PM    Final    VAS [place] LOWER EXTREMITY VENOUS (DVT)  Result Date: 11/22/2021  Lower Venous DVT Study Patient Name:  DALEYZA ALLES  Date of Exam:   11/22/2021 Medical Rec #: [id number]       Accession #:    [id number] Date of Birth: 1956-09-28      Patient Gender: F Patient Age:   78 years Exam  Location:  [facility] Procedure:      VAS [place] LOWER EXTREMITY VENOUS (DVT) Referring Phys: STEPHANIE REESE --------------------------------------------------------------------------------  Indications: Swelling.  Risk Factors: COVID 19 positive. Limitations: Body habitus, poor ultrasound/tissue interface and patient positioning, patient immobility, size and depth of vessels. Comparison Study: No prior studies. Performing Technologist: Oliver Hum RVT  Examination Guidelines: A complete evaluation includes B-mode imaging, spectral Doppler, color Doppler, and power Doppler as needed of all accessible portions of each vessel. Bilateral testing is considered an integral part of a complete examination. Limited examinations for reoccurring indications may be performed as noted. The reflux portion of the exam is performed with the patient in reverse Trendelenburg.  +---------+---------------+---------+-----------+----------+-------------------+  RIGHT     Compressibility Phasicity Spontaneity Properties Thrombus Aging       +---------+---------------+---------+-----------+----------+-------------------+  CFV       Full            Yes       Yes                                         +---------+---------------+---------+-----------+----------+-------------------+  SFJ       Full                                                                  +---------+---------------+---------+-----------+----------+-------------------+  FV Prox   Full                                                                  +---------+---------------+---------+-----------+----------+-------------------+  FV Mid                    Yes       Yes                                         +---------+---------------+---------+-----------+----------+-------------------+  FV Distal                 Yes       Yes                                         +---------+---------------+---------+-----------+----------+-------------------+  PFV        Full                                                                  +---------+---------------+---------+-----------+----------+-------------------+  POP       Full            Yes       Yes                                         +---------+---------------+---------+-----------+----------+-------------------+  PTV       Full                                                                  +---------+---------------+---------+-----------+----------+-------------------+  PERO                                                       Not well visualized  +---------+---------------+---------+-----------+----------+-------------------+   +---------+---------------+---------+-----------+----------+-------------------+  LEFT      Compressibility Phasicity Spontaneity Properties Thrombus Aging       +---------+---------------+---------+-----------+----------+-------------------+  CFV       Full            Yes       Yes                                         +---------+---------------+---------+-----------+----------+-------------------+  SFJ       Full                                                                  +---------+---------------+---------+-----------+----------+-------------------+  FV Prox   Full                                                                  +---------+---------------+---------+-----------+----------+-------------------+  FV Mid    Full            Yes       Yes                                         +---------+---------------+---------+-----------+----------+-------------------+  FV Distal Full            Yes       Yes                                         +---------+---------------+---------+-----------+----------+-------------------+  PFV       Full                                                                  +---------+---------------+---------+-----------+----------+-------------------+  POP       Full            Yes       Yes                                          +---------+---------------+---------+-----------+----------+-------------------+  PTV       Full                                                                  +---------+---------------+---------+-----------+----------+-------------------+  PERO                                                       Not well visualized  +---------+---------------+---------+-----------+----------+-------------------+     Summary: RIGHT: - There is no evidence of deep vein thrombosis in the lower extremity. However, portions of this examination were limited- see technologist comments above.  - No cystic structure found in the popliteal fossa.  LEFT: - There is no evidence of deep vein thrombosis in the lower extremity. However, portions of this examination were limited- see technologist comments above.  - No cystic structure found in the popliteal fossa.  *See table(s) above for measurements and observations. Electronically signed by Monica Martinez MD on 11/22/2021 at 4:42:59 PM.    Final      Labs:   Basic Metabolic Panel: Recent Labs  Lab 11/22/21 0508 11/23/21  0430 11/24/21 0420 11/25/21 0558 11/26/21 0255  NA 135 142 143 141 140  K 4.2 3.3* 3.7 3.9 4.2  CL 103 107 107 105 105  CO2 20* 25 28 26 25   GLUCOSE 153* 179* 186* 246* 197*  BUN 5* 8 11 14 13   CREATININE 0.65 0.65 0.68 0.64 0.63  CALCIUM 7.4* 7.9* 7.8* 8.6* 8.5*  MG 1.1* 2.3  --   --   --   PHOS  --  2.3*  --  2.9  --    GFR Estimated Creatinine Clearance: 69 mL/min (by C-G formula based on SCr of 0.63 mg/dL). Liver Function Tests: Recent Labs  Lab 11/21/21 1850 11/23/21 0430 11/24/21 0420  AST 21 35 33  ALT 9 13 12   ALKPHOS 50 54 48  BILITOT 0.4 0.4 0.4  PROT 6.1* 6.3* 6.3*  ALBUMIN 3.1* 3.1* 3.2*   No results for input(s): LIPASE, AMYLASE in the last 168 hours. No results for input(s): AMMONIA in the last 168 hours. Coagulation profile Recent Labs  Lab 11/21/21 1850  INR 1.2    CBC: Recent Labs  Lab 11/21/21 1850  11/21/21 1926 11/22/21 0508 11/23/21 0430 11/26/21 0446  WBC 6.4  --  9.3 4.3 4.3  NEUTROABS 5.2  --   --  3.6 3.2  HGB 12.3 12.9 11.6* 12.9 11.5*  HCT 38.8 38.0 37.4 40.3 35.6*  MCV 76.5*  --  77.9* 75.6* 75.9*  PLT 173  --  145* 153 149*   Cardiac Enzymes: No results for input(s): CKTOTAL, CKMB, CKMBINDEX, TROPONINI in the last 168 hours. BNP: Invalid input(s): POCBNP CBG: Recent Labs  Lab 11/25/21 0750 11/25/21 1202 11/25/21 1659 11/25/21 2226 11/26/21 0758  GLUCAP 201* 185* 113* 156* 182*   D-Dimer No results for input(s): DDIMER in the last 72 hours. Hgb A1c No results for input(s): HGBA1C in the last 72 hours. Lipid Profile No results for input(s): CHOL, HDL, LDLCALC, TRIG, CHOLHDL, LDLDIRECT in the last 72 hours. Thyroid function studies No results for input(s): TSH, T4TOTAL, T3FREE, THYROIDAB in the last 72 hours.  Invalid input(s): FREET3 Anemia work up No results for input(s): VITAMINB12, FOLATE, FERRITIN, TIBC, IRON, RETICCTPCT in the last 72 hours. Microbiology Recent Results (from the past 240 hour(s))  Resp Panel by RT-PCR (Flu A&B, Covid) Nasopharyngeal Swab     Status: Abnormal   Collection Time: 11/21/21  5:08 PM   Specimen: Nasopharyngeal Swab; Nasopharyngeal(NP) swabs in vial transport medium  Result Value Ref Range Status   SARS Coronavirus 2 by RT PCR POSITIVE (A) NEGATIVE Final    Comment: (NOTE) SARS-CoV-2 target nucleic acids are DETECTED.  The SARS-CoV-2 RNA is generally detectable in upper respiratory specimens during the acute phase of infection. Positive results are indicative of the presence of the identified virus, but do not rule out bacterial infection or co-infection with other pathogens not detected by the test. Clinical correlation with patient history and other diagnostic information is necessary to determine patient infection status. The expected result is Negative.  Fact Sheet for  Patients: EntrepreneurPulse.com.au  Fact Sheet for Healthcare Providers: IncredibleEmployment.be  This test is not yet approved or cleared by the [place] FDA and  has been authorized for detection and/or diagnosis of SARS-CoV-2 by FDA under an Emergency Use Authorization (EUA).  This EUA will remain in effect (meaning this test can be used) for the duration of  the COVID-19 declaration under Section 564(b)(1) of the A ct, 21 U.S.C. section 360bbb-3(b)(1), unless the authorization is terminated or  revoked sooner.     Influenza A by PCR POSITIVE (A) NEGATIVE Final   Influenza B by PCR NEGATIVE NEGATIVE Final    Comment: (NOTE) The Xpert Xpress SARS-CoV-2/FLU/RSV plus assay is intended as an aid in the diagnosis of influenza from Nasopharyngeal swab specimens and should not be used as a sole basis for treatment. Nasal washings and aspirates are unacceptable for Xpert Xpress SARS-CoV-2/FLU/RSV testing.  Fact Sheet for Patients: EntrepreneurPulse.com.au  Fact Sheet for Healthcare Providers: IncredibleEmployment.be  This test is not yet approved or cleared by the [place] FDA and has been authorized for detection and/or diagnosis of SARS-CoV-2 by FDA under an Emergency Use Authorization (EUA). This EUA will remain in effect (meaning this test can be used) for the duration of the COVID-19 declaration under Section 564(b)(1) of the Act, 21 U.S.C. section 360bbb-3(b)(1), unless the authorization is terminated or revoked.  Performed at [facility], [place] [address], [place], [place] [address]   Blood Culture (routine x 2)     Status: None (Preliminary result)   Collection Time: 11/21/21  6:50 PM   Specimen: BLOOD  Result Value Ref Range Status   Specimen Description BLOOD RIGHT GROIN  Final   Special Requests   Final    BOTTLES DRAWN AEROBIC AND ANAEROBIC Blood Culture adequate volume   Culture    Final    NO GROWTH 4 DAYS Performed at [facility], [place] [address], [place], [place] [address]    Report Status PENDING  Incomplete  Culture, blood (Routine X 2) w Reflex to ID Panel     Status: None (Preliminary result)   Collection Time: 11/21/21  8:30 PM   Specimen: BLOOD LEFT HAND  Result Value Ref Range Status   Specimen Description BLOOD LEFT HAND  Final   Special Requests   Final    BOTTLES DRAWN AEROBIC AND ANAEROBIC Blood Culture adequate volume   Culture   Final    NO GROWTH 4 DAYS Performed at [facility], [place] [address], [place], [place] [address]    Report Status PENDING  Incomplete  Urine Culture     Status: None   Collection Time: 11/21/21  9:22 PM   Specimen: In/Out Cath Urine  Result Value Ref Range Status   Specimen Description IN/OUT CATH URINE  Final   Special Requests NONE  Final   Culture   Final    NO GROWTH Performed at [facility], [place] [address] [place], [place] [address]    Report Status 11/22/2021 FINAL  Final  MRSA Next Gen by PCR, Nasal     Status: None   Collection Time: 11/21/21 11:12 PM   Specimen: Nasal Mucosa; Nasal Swab  Result Value Ref Range Status   MRSA by PCR Next Gen NOT DETECTED NOT DETECTED Final    Comment: (NOTE) The GeneXpert MRSA Assay (FDA approved for NASAL specimens only), is one component of a comprehensive MRSA colonization surveillance program. It is not intended to diagnose MRSA infection nor to guide or monitor treatment for MRSA infections. Test performance is not FDA approved in patients less than 18 years old. Performed at [facility], [place] [address], [place], [place] [address]      Signed: Terrilee Croak  [facility] 11/26/2021, 10:55 AM

## 2022-04-10 IMAGING — CT CT ANGIO CHEST
2 of 6 series · 18 of 36 positions shown · IV contrast (agent unspecified)
Comparison: Chest x-ray 11/21/2021, CT chest 09/03/2005

CLINICAL DATA: Hypoxia

EXAM:
CT ANGIOGRAPHY CHEST WITH CONTRAST
TECHNIQUE: Multidetector CT imaging of the chest was performed using the
standard protocol during bolus administration of intravenous
contrast. Multiplanar CT image reconstructions and MIPs were
obtained to evaluate the vascular anatomy.

[Series 7: pe thins · axial · 0.97mm/px · z∈[-445,-225]mm · 17 of 350 slices shown]
[im 18/350  lung]
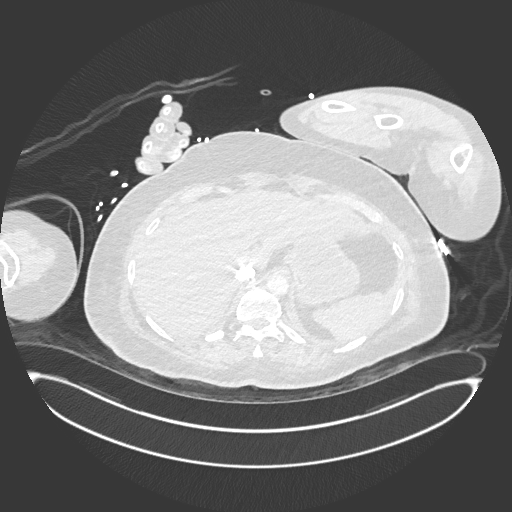
[im 35/350  mediastinal]
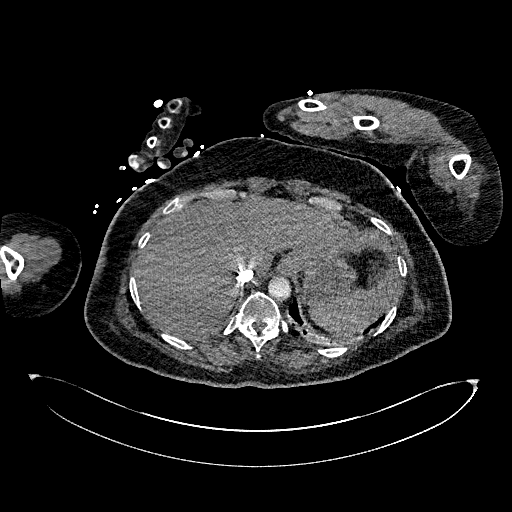
[im 53/350  lung]
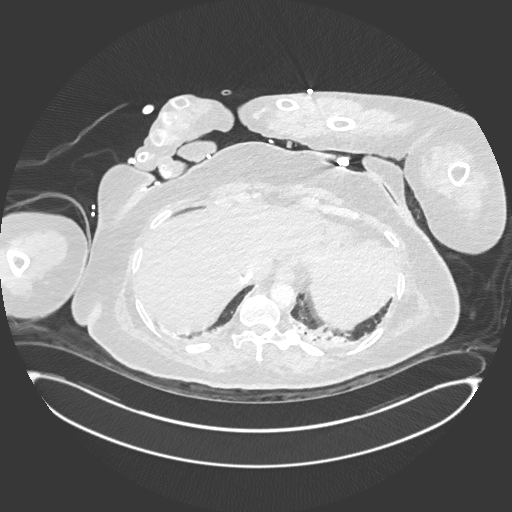
[im 70/350  mediastinal]
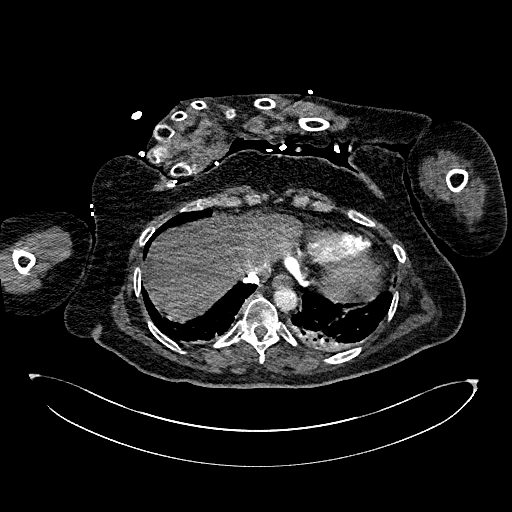
[im 105/350  lung]
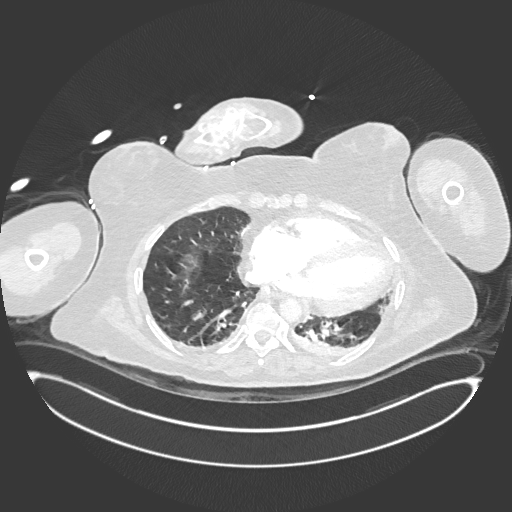
[im 123/350  mediastinal]
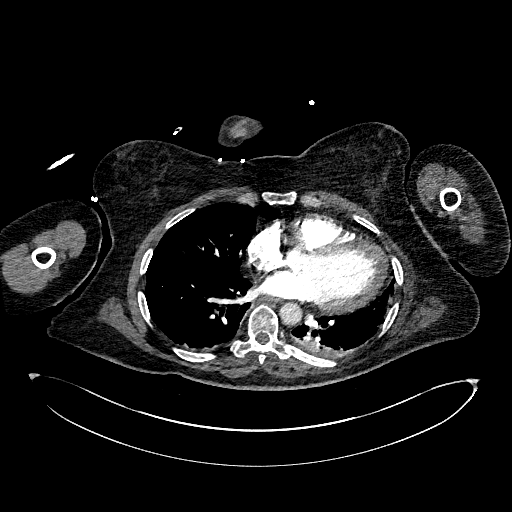
[im 140/350  lung]
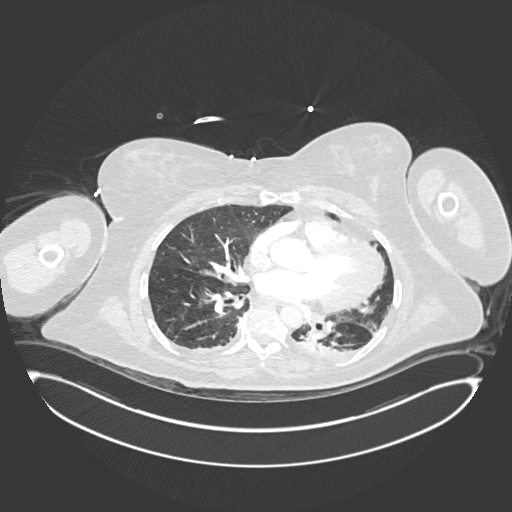
[im 158/350  mediastinal]
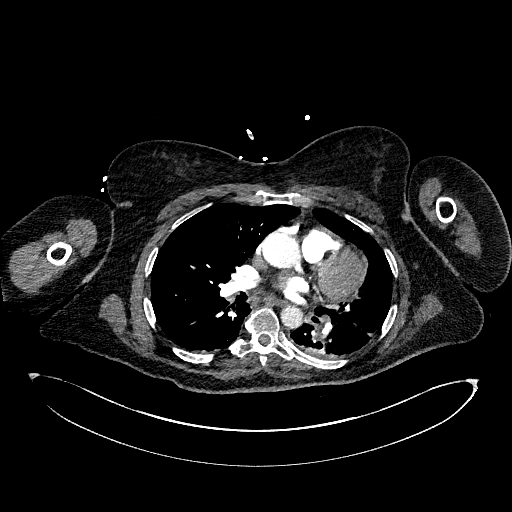
[im 175/350  lung]
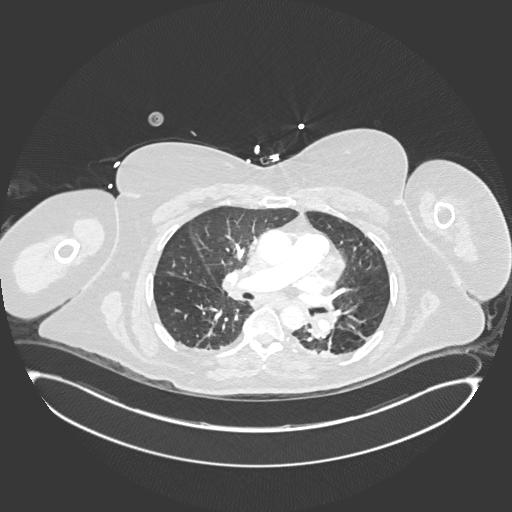
[im 192/350  mediastinal]
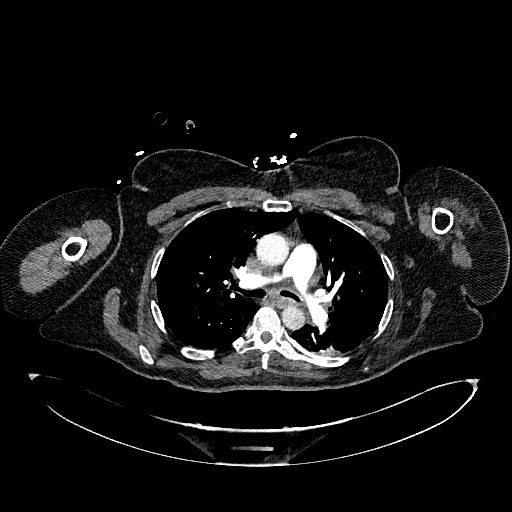
[im 210/350  lung]
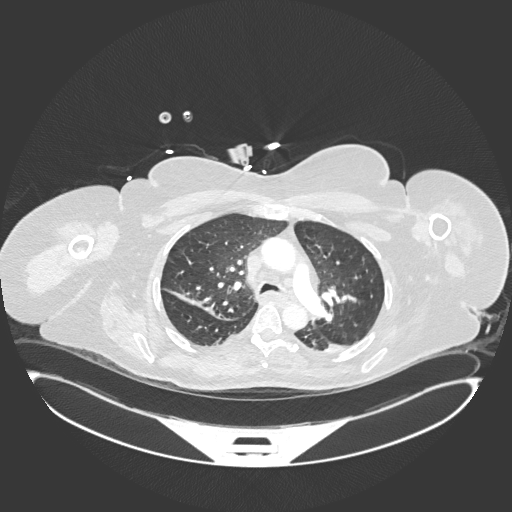
[im 227/350  mediastinal]
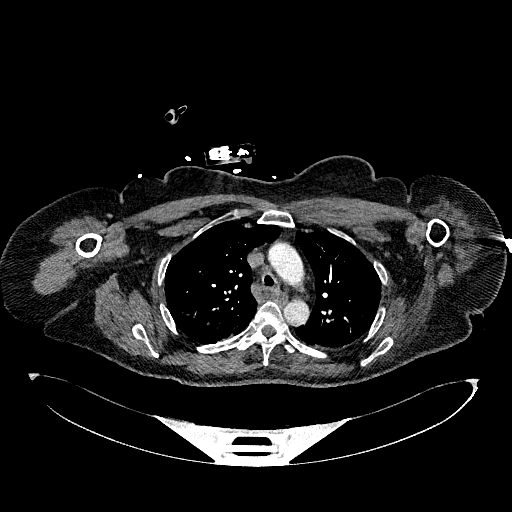
[im 245/350  lung]
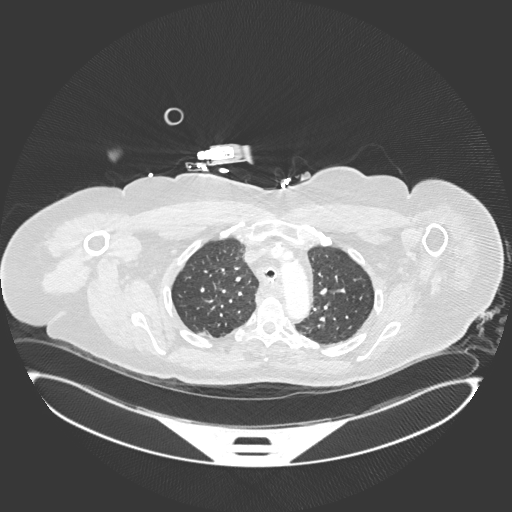
[im 280/350  mediastinal]
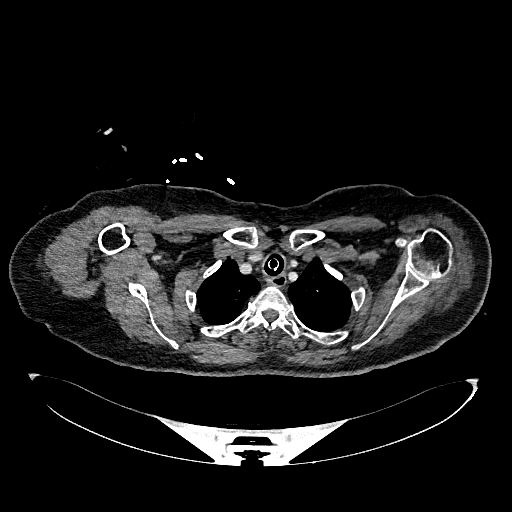
[im 297/350  lung]
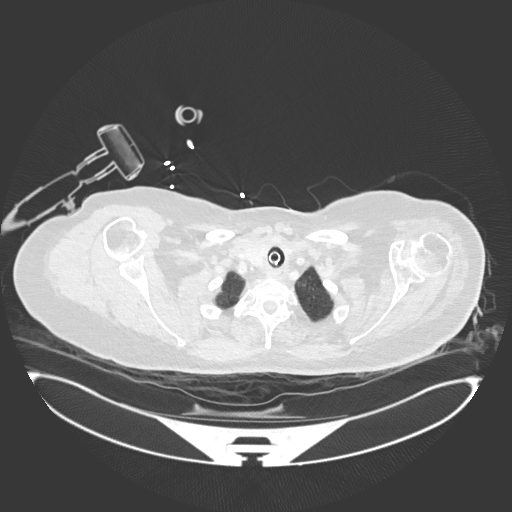
[im 315/350  mediastinal]
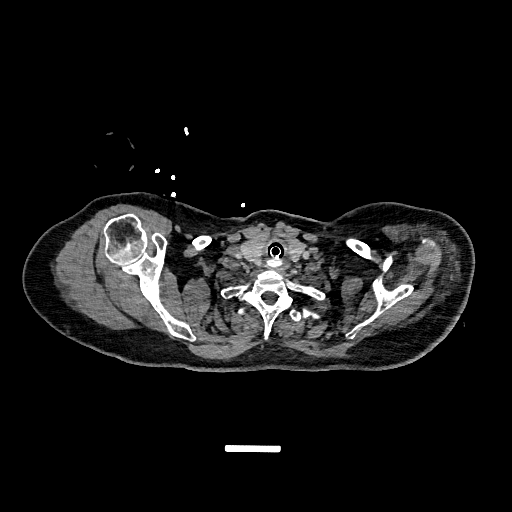
[im 332/350  lung]
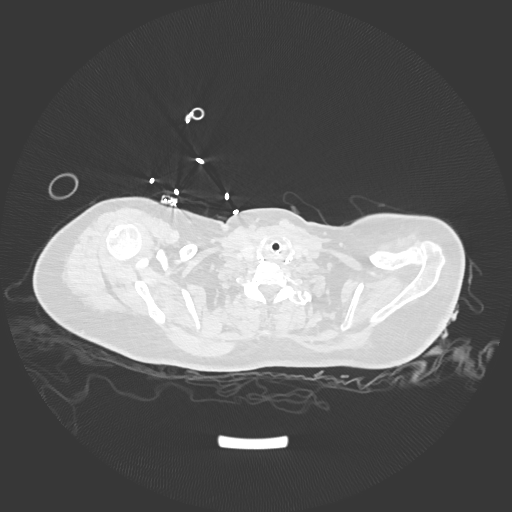

[Series 8: pe 2mm cor · coronal · 0.47mm/px · 1 of 151 slices shown]
[im 76/151  mediastinal]
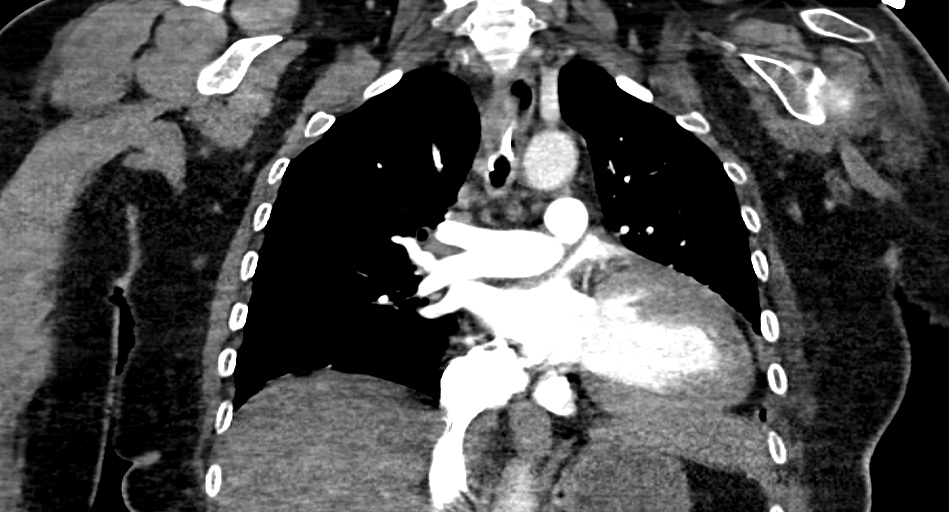

[18 of 36 positions shown; findings below may reference images not displayed]

RADIATION DOSE REDUCTION: This exam was performed according to the
departmental dose-optimization program which includes automated
exposure control, adjustment of the mA and/or kV according to
patient size and/or use of iterative reconstruction technique.

CONTRAST:  50mL OMNIPAQUE IOHEXOL 350 MG/ML SOLN
FINDINGS: Cardiovascular: Satisfactory opacification of the pulmonary arteries
to the segmental level. No evidence of pulmonary embolism. Aorta is
nonaneurysmal. Mild atherosclerosis. The heart is slightly enlarged.
No pericardial effusion

Mediastinum/Nodes: Midline trachea with endotracheal tube, the tip
is about 1.2 cm superior to carina. Borderline mediastinal lymph
nodes, AP window lymph nodes measuring up to 11 mm. Esophagus within
normal limits.

Lungs/Pleura: Partial consolidation in the left lung base. No
pleural effusion or pneumothorax

Upper Abdomen: No acute abnormality.

Musculoskeletal: No acute osseous abnormality.

Review of the MIP images confirms the above findings.
IMPRESSION: 1. Negative for acute pulmonary embolus.
2. Partial consolidation in the left lung base may be due to
atelectasis, pneumonia, or aspiration

Aortic Atherosclerosis ([id number]).

## 2022-04-10 IMAGING — CT CT HEAD W/O CM
3 of 4 series · 13 of 47 positions shown, 15 images · non-contrast
Comparison: Head CT dated 03/05/2019.

CLINICAL DATA: Delirium.



[Series 3: head without · axial · non-contrast · 0.49mm/px · z∈[-132,-6]mm · 7 of 35 slices shown, 9 images]
[im 5/35  brain]
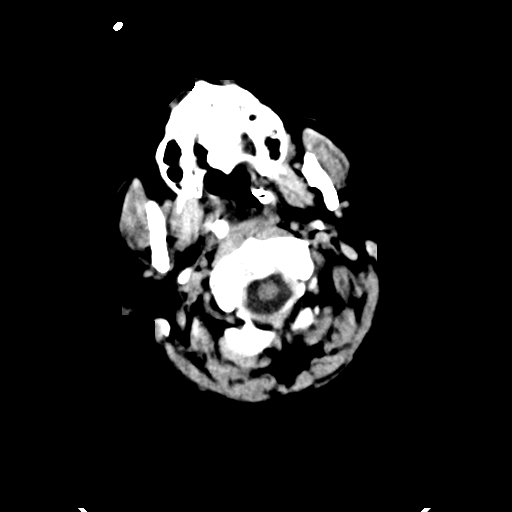
[im 5/35  bone]
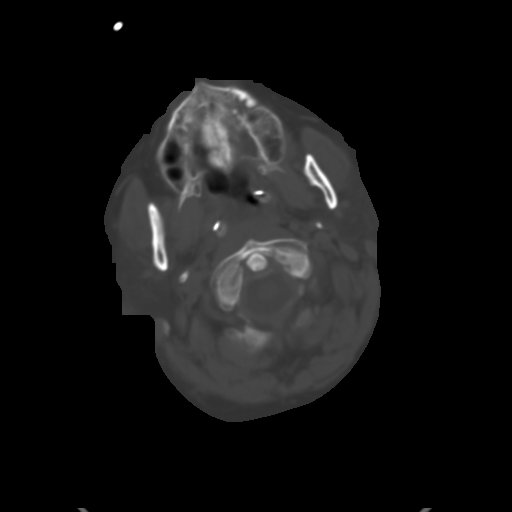
[im 9/35  brain]
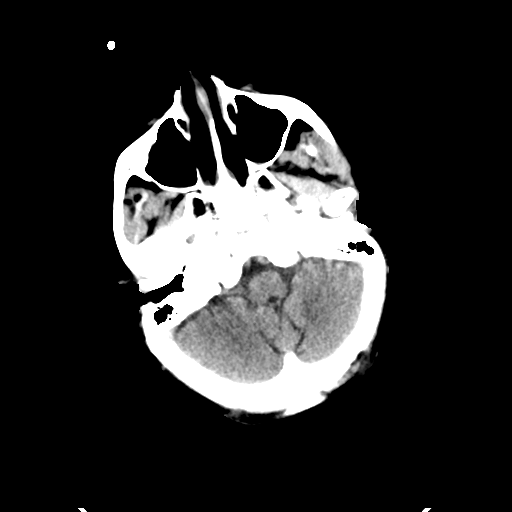
[im 13/35  brain]
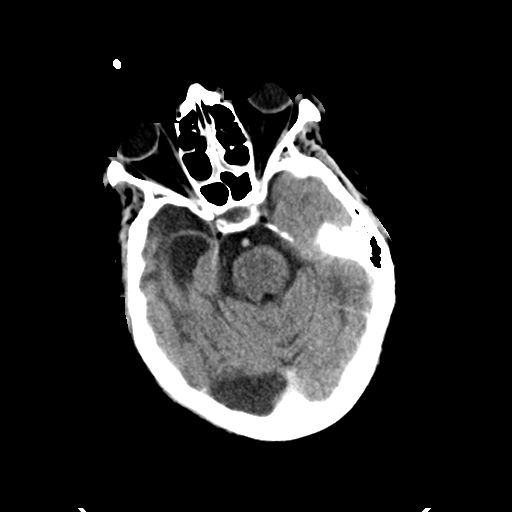
[im 18/35  brain]
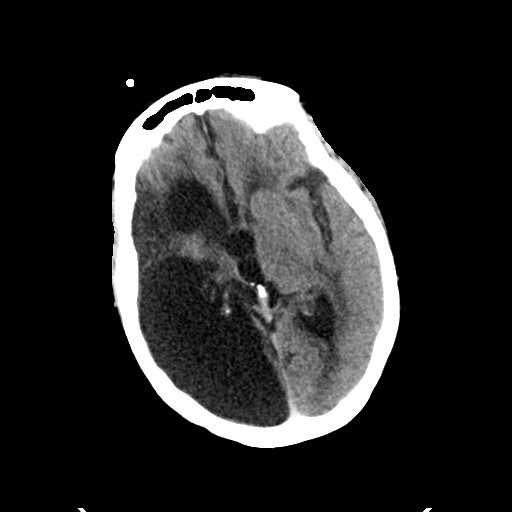
[im 22/35  brain]
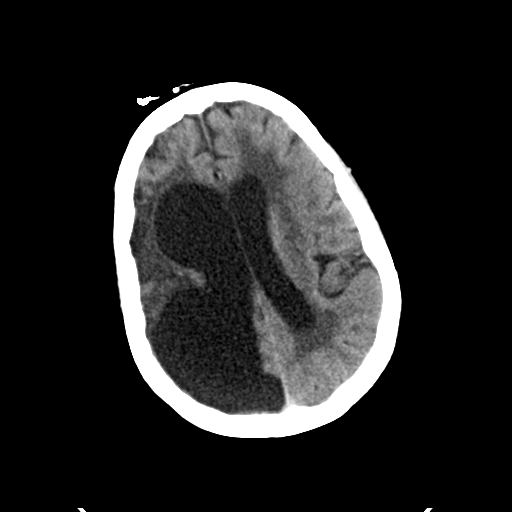
[im 22/35  bone]
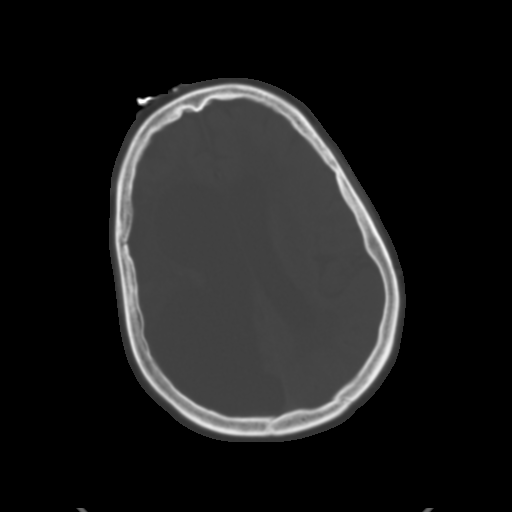
[im 26/35  brain]
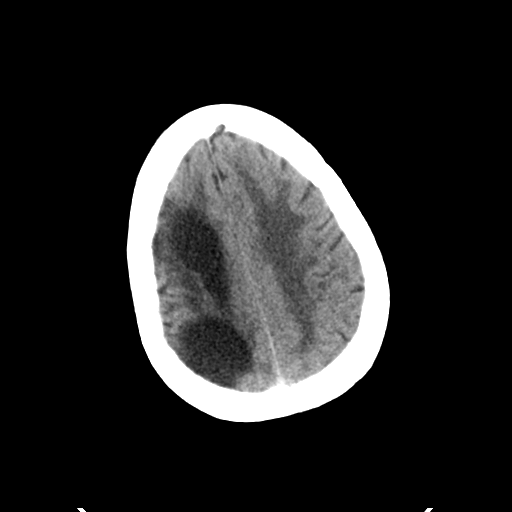
[im 30/35  brain]
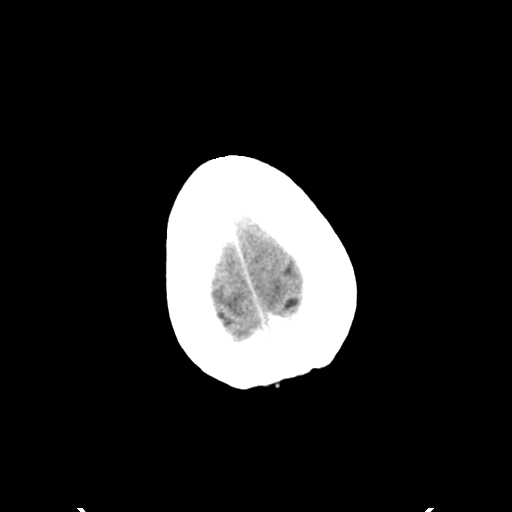

[Series 5: head without cor · coronal · non-contrast · 0.34mm/px · 3 of 69 slices shown]
[im 23/69  brain]
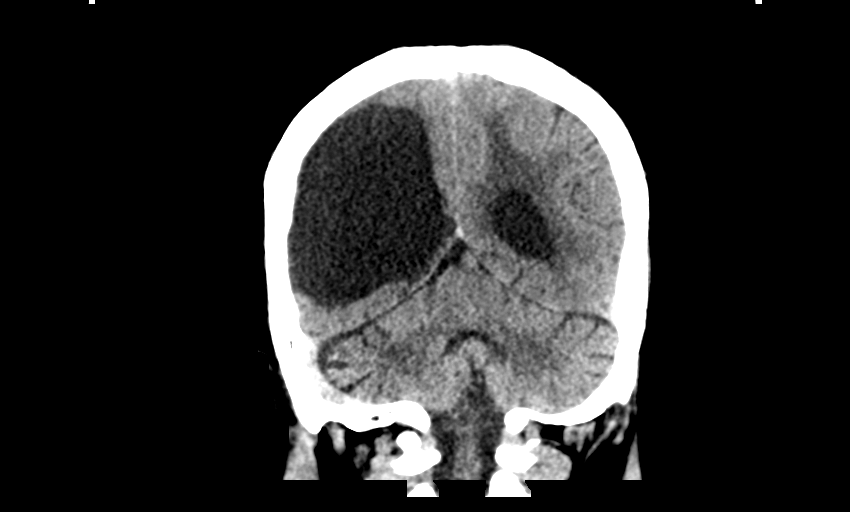
[im 31/69  brain]
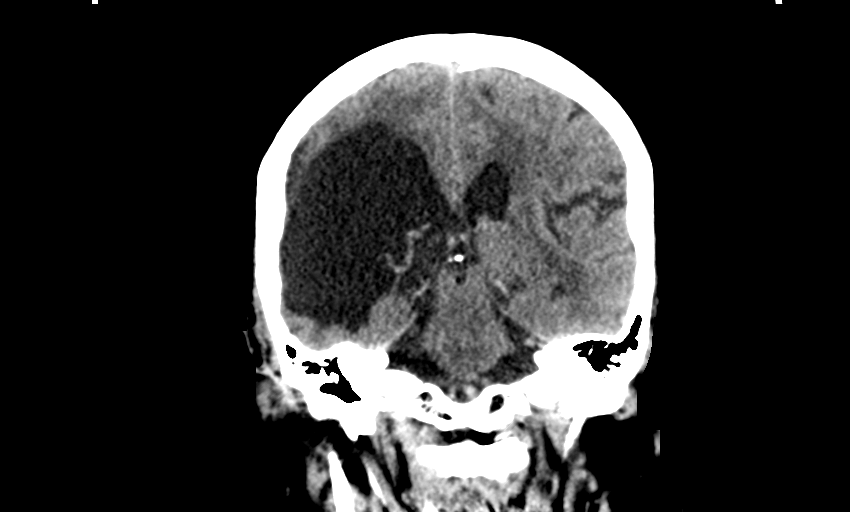
[im 38/69  brain]
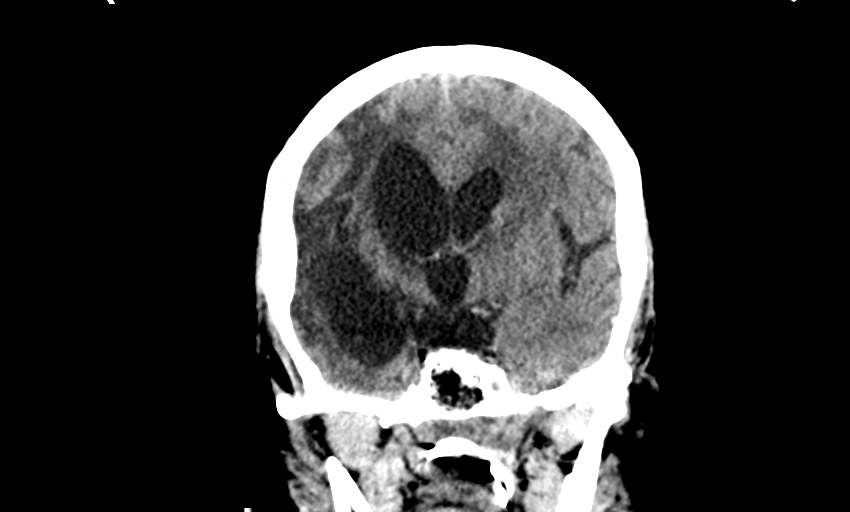

[Series 6: head without sag · sagittal · non-contrast · 0.34mm/px · 3 of 67 slices shown]
[im 23/67  brain]
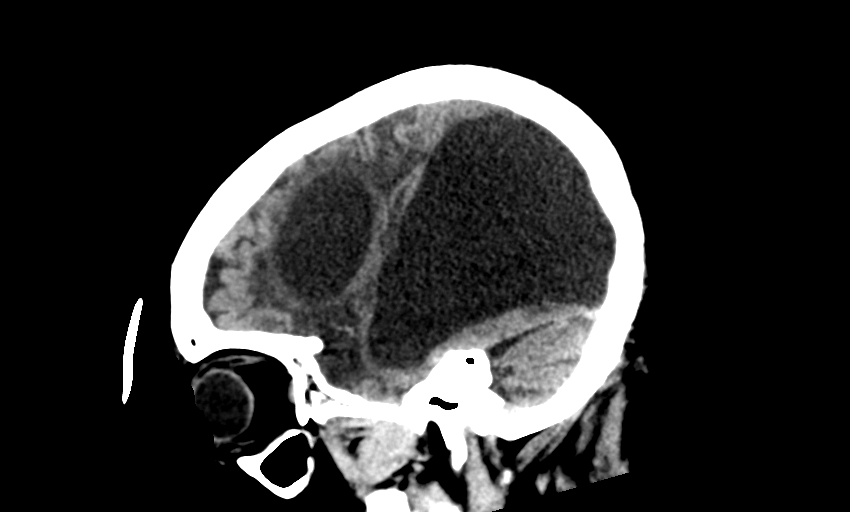
[im 34/67  brain]
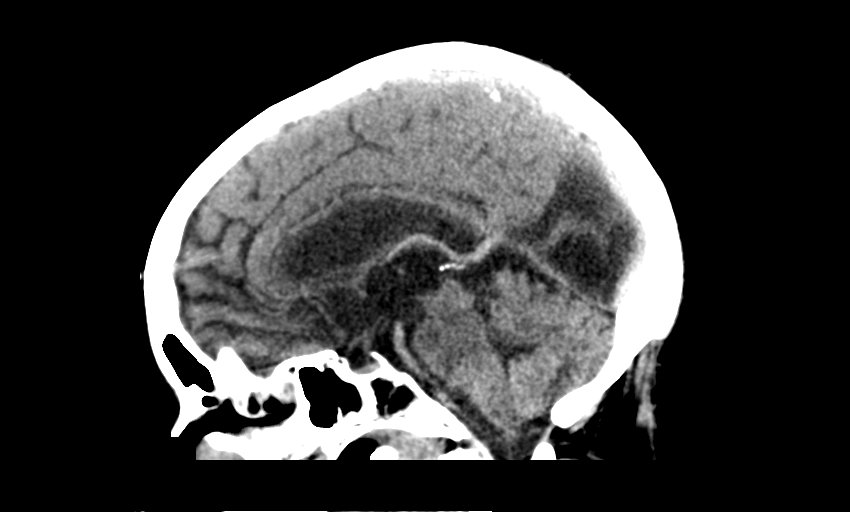
[im 45/67  brain]
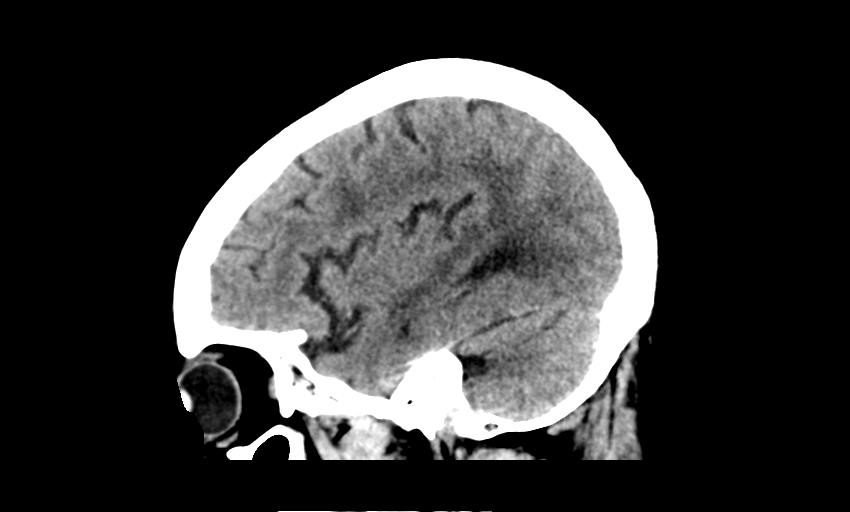

[13 of 47 positions shown; findings below may reference images not displayed]

FINDINGS: Brain: Sequela prior right MCA and PCA infarcts and encephalomalacia
with severe ex vacuo dilatation of the right lateral ventricle
similar to prior CT. There is moderate age-related atrophy and
chronic microvascular ischemic changes. There is no acute
intracranial hemorrhage. No midline shift. No extra-axial fluid
collection.

Vascular: No hyperdense vessel or unexpected calcification.

Skull: Normal. Negative for fracture or focal lesion.

Sinuses/Orbits: Diffuse mucoperiosteal thickening of paranasal
sinuses. The mastoid air cells are clear.

Other: None
IMPRESSION: 1. No acute intracranial pathology.
2. Moderate age-related atrophy and chronic microvascular ischemic
changes. Sequela prior right MCA and PCA infarcts and dilatation of
right lateral ventricle.

## 2022-04-11 IMAGING — DX DG ABDOMEN 1V
1 series · 1 of 1 positions shown · non-contrast
Comparison: Abdominal radiograph dated 09/13/2008.

CLINICAL DATA: Enteric tube placement.

EXAM:
ABDOMEN - 1 VIEW

[abdomen supine]
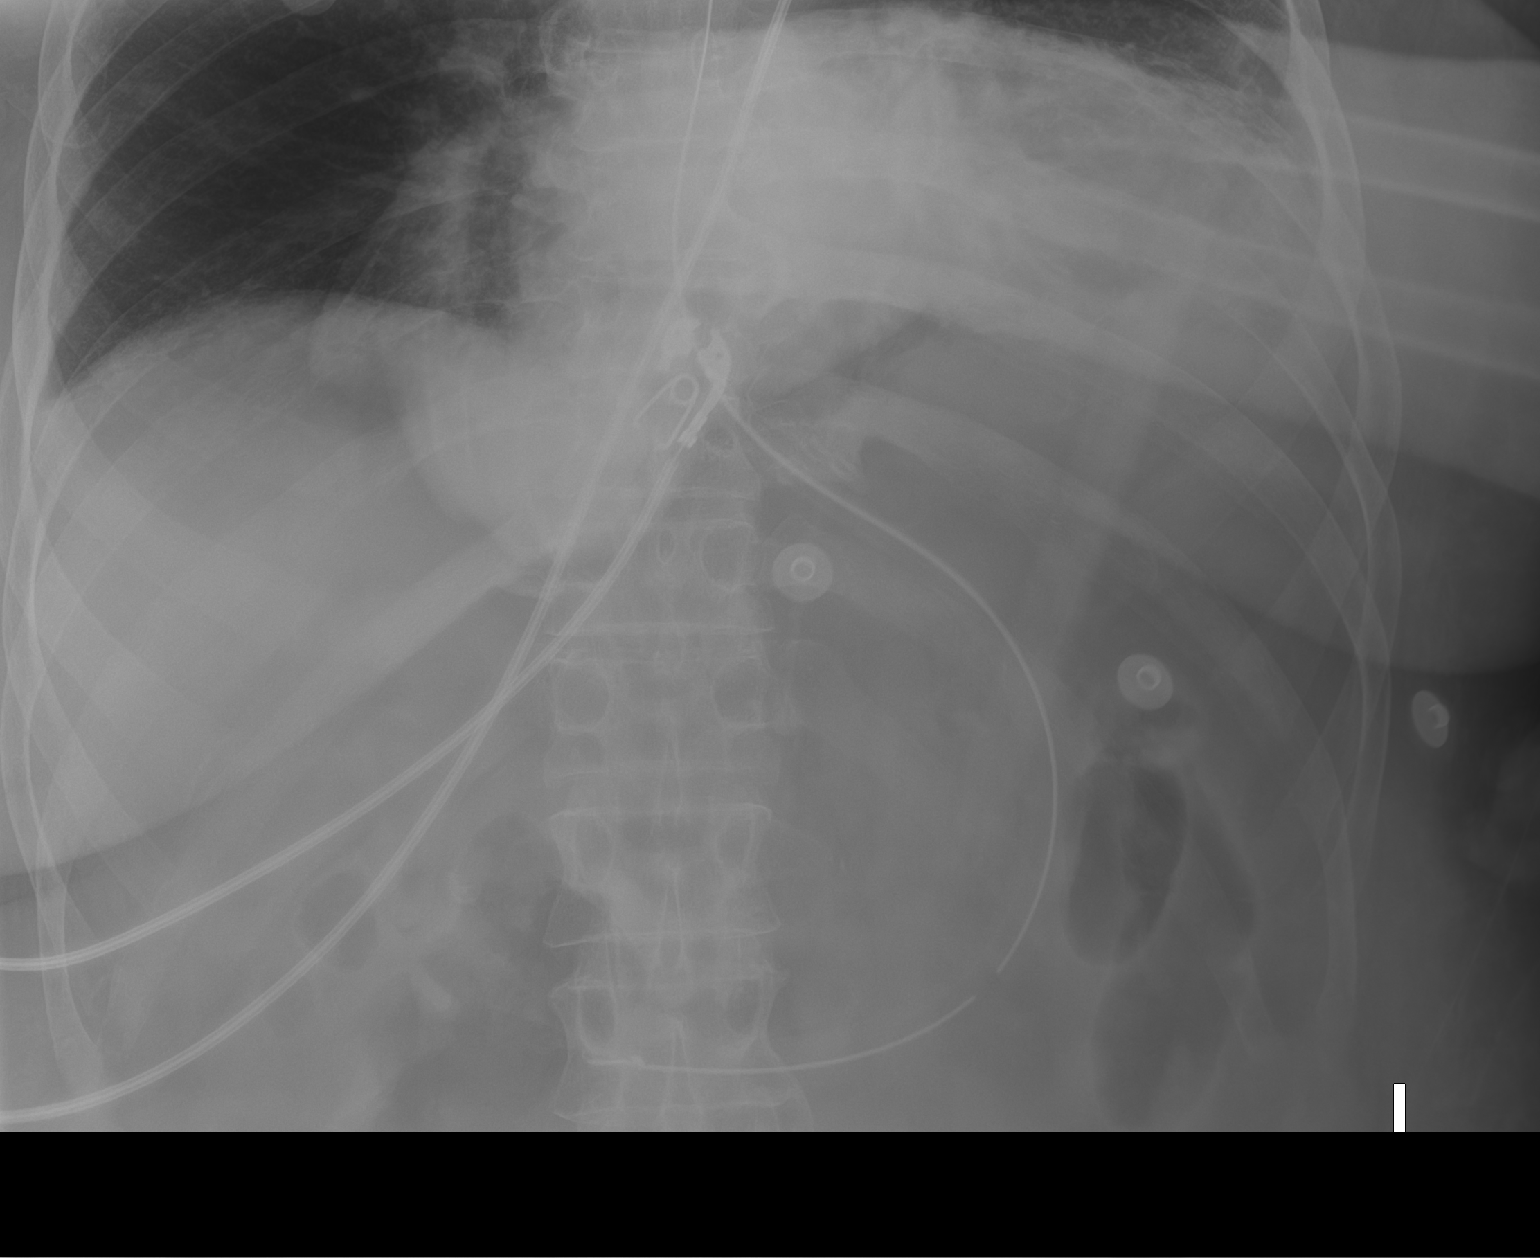

[1 of 1 positions shown; findings below may reference images not displayed]

FINDINGS: Enteric tube with tip in the distal stomach. No bowel dilatation in
the visualized abdomen.

Left lung base atelectasis or infiltrate.
IMPRESSION: Enteric tube with tip in the distal stomach.
